# Patient Record
Sex: Female | Born: 1950
Health system: Southern US, Community
[De-identification: ages and names within clinical notes are randomized; demographics above are authoritative.]

## PROBLEM LIST (undated history)

## (undated) DIAGNOSIS — R0600 Dyspnea, unspecified: Secondary | ICD-10-CM

## (undated) DIAGNOSIS — T8859XA Other complications of anesthesia, initial encounter: Secondary | ICD-10-CM

## (undated) DIAGNOSIS — M1990 Unspecified osteoarthritis, unspecified site: Secondary | ICD-10-CM

## (undated) DIAGNOSIS — T4145XA Adverse effect of unspecified anesthetic, initial encounter: Secondary | ICD-10-CM

## (undated) DIAGNOSIS — I10 Essential (primary) hypertension: Secondary | ICD-10-CM

## (undated) DIAGNOSIS — K219 Gastro-esophageal reflux disease without esophagitis: Secondary | ICD-10-CM

## (undated) DIAGNOSIS — J45909 Unspecified asthma, uncomplicated: Secondary | ICD-10-CM

## (undated) DIAGNOSIS — M8580 Other specified disorders of bone density and structure, unspecified site: Secondary | ICD-10-CM

## (undated) DIAGNOSIS — D649 Anemia, unspecified: Secondary | ICD-10-CM

## (undated) DIAGNOSIS — E785 Hyperlipidemia, unspecified: Secondary | ICD-10-CM

## (undated) DIAGNOSIS — R51 Headache: Secondary | ICD-10-CM

## (undated) DIAGNOSIS — Z9889 Other specified postprocedural states: Secondary | ICD-10-CM

## (undated) DIAGNOSIS — R112 Nausea with vomiting, unspecified: Secondary | ICD-10-CM

## (undated) DIAGNOSIS — J449 Chronic obstructive pulmonary disease, unspecified: Secondary | ICD-10-CM

## (undated) HISTORY — DX: Essential (primary) hypertension: I10

## (undated) HISTORY — PX: ABDOMINAL HYSTERECTOMY: SHX81

## (undated) HISTORY — DX: Gastro-esophageal reflux disease without esophagitis: K21.9

## (undated) HISTORY — DX: Unspecified osteoarthritis, unspecified site: M19.90

## (undated) HISTORY — DX: Hyperlipidemia, unspecified: E78.5

## (undated) HISTORY — PX: OOPHORECTOMY: SHX86

## (undated) HISTORY — DX: Other specified disorders of bone density and structure, unspecified site: M85.80

## (undated) HISTORY — DX: Unspecified asthma, uncomplicated: J45.909

---

## 1988-11-24 HISTORY — PX: KNEE ARTHROSCOPY: SUR90

## 1990-11-24 HISTORY — PX: KNEE CARTILAGE SURGERY: SHX688

## 2000-09-21 ENCOUNTER — Encounter: Admission: RE | Admit: 2000-09-21 | Discharge: 2000-09-21 | Payer: Self-pay | Admitting: Internal Medicine

## 2005-03-24 LAB — HM COLONOSCOPY: HM Colonoscopy: NORMAL

## 2005-03-28 ENCOUNTER — Ambulatory Visit: Payer: Self-pay | Admitting: Gastroenterology

## 2006-06-15 ENCOUNTER — Ambulatory Visit: Payer: Self-pay | Admitting: Family Medicine

## 2006-06-29 ENCOUNTER — Ambulatory Visit: Payer: Self-pay | Admitting: Family Medicine

## 2006-08-27 ENCOUNTER — Ambulatory Visit: Payer: Self-pay | Admitting: Family Medicine

## 2006-08-31 ENCOUNTER — Ambulatory Visit: Payer: Self-pay | Admitting: Family Medicine

## 2006-10-08 ENCOUNTER — Ambulatory Visit: Payer: Self-pay | Admitting: Family Medicine

## 2006-11-05 ENCOUNTER — Ambulatory Visit: Payer: Self-pay | Admitting: Pain Medicine

## 2007-02-07 ENCOUNTER — Encounter: Payer: Self-pay | Admitting: Family Medicine

## 2007-02-07 DIAGNOSIS — G43109 Migraine with aura, not intractable, without status migrainosus: Secondary | ICD-10-CM | POA: Insufficient documentation

## 2007-02-07 DIAGNOSIS — M899 Disorder of bone, unspecified: Secondary | ICD-10-CM | POA: Insufficient documentation

## 2007-02-07 DIAGNOSIS — I10 Essential (primary) hypertension: Secondary | ICD-10-CM | POA: Insufficient documentation

## 2007-02-07 DIAGNOSIS — E785 Hyperlipidemia, unspecified: Secondary | ICD-10-CM | POA: Insufficient documentation

## 2007-02-07 DIAGNOSIS — M949 Disorder of cartilage, unspecified: Secondary | ICD-10-CM

## 2007-02-07 DIAGNOSIS — M25569 Pain in unspecified knee: Secondary | ICD-10-CM | POA: Insufficient documentation

## 2007-02-07 DIAGNOSIS — M1990 Unspecified osteoarthritis, unspecified site: Secondary | ICD-10-CM | POA: Insufficient documentation

## 2007-02-07 DIAGNOSIS — E782 Mixed hyperlipidemia: Secondary | ICD-10-CM | POA: Insufficient documentation

## 2007-02-07 DIAGNOSIS — K219 Gastro-esophageal reflux disease without esophagitis: Secondary | ICD-10-CM | POA: Insufficient documentation

## 2007-05-20 ENCOUNTER — Encounter: Payer: Self-pay | Admitting: Family Medicine

## 2007-06-01 HISTORY — PX: TOTAL KNEE ARTHROPLASTY: SHX125

## 2007-06-14 ENCOUNTER — Encounter: Payer: Self-pay | Admitting: Family Medicine

## 2007-07-12 ENCOUNTER — Encounter: Payer: Self-pay | Admitting: Family Medicine

## 2007-07-16 ENCOUNTER — Ambulatory Visit: Payer: Self-pay | Admitting: Family Medicine

## 2007-07-16 LAB — CONVERTED CEMR LAB
Cholesterol, target level: 200 mg/dL
HDL goal, serum: 40 mg/dL

## 2007-08-19 ENCOUNTER — Encounter: Payer: Self-pay | Admitting: Family Medicine

## 2007-09-07 ENCOUNTER — Ambulatory Visit: Payer: Self-pay | Admitting: Family Medicine

## 2007-09-09 LAB — CONVERTED CEMR LAB
AST: 20 units/L (ref 0–37)
Cholesterol: 153 mg/dL (ref 0–200)
HDL: 43.9 mg/dL (ref >39.0)
LDL Cholesterol: 73 mg/dL (ref 0–99)
Total CHOL/HDL Ratio: 3.5

## 2007-11-08 ENCOUNTER — Encounter: Payer: Self-pay | Admitting: Family Medicine

## 2007-11-30 ENCOUNTER — Telehealth: Payer: Self-pay | Admitting: Family Medicine

## 2008-01-10 ENCOUNTER — Encounter: Payer: Self-pay | Admitting: Family Medicine

## 2008-01-19 ENCOUNTER — Encounter (INDEPENDENT_AMBULATORY_CARE_PROVIDER_SITE_OTHER): Payer: Self-pay | Admitting: *Deleted

## 2008-03-09 ENCOUNTER — Ambulatory Visit: Payer: Self-pay | Admitting: Family Medicine

## 2008-03-17 LAB — CONVERTED CEMR LAB
Albumin: 3.6 g/dL (ref 3.5–5.2)
BUN: 6 mg/dL (ref 6–23)
Calcium: 8.5 mg/dL (ref 8.4–10.5)
Cholesterol: 188 mg/dL (ref 0–200)
GFR calc Af Amer: 95 mL/min
GFR calc non Af Amer: 78 mL/min
Glucose, Bld: 112 mg/dL — ABNORMAL HIGH (ref 70–99)
HDL: 38.9 mg/dL — ABNORMAL LOW (ref >39.0)
LDL Cholesterol: 121 mg/dL — ABNORMAL HIGH (ref 0–99)
Potassium: 4.2 meq/L (ref 3.5–5.1)
Total Protein: 6.9 g/dL (ref 6.0–8.3)
VLDL: 29 mg/dL (ref 0–40)

## 2008-03-21 ENCOUNTER — Encounter: Payer: Self-pay | Admitting: Family Medicine

## 2008-06-08 ENCOUNTER — Telehealth: Payer: Self-pay | Admitting: Family Medicine

## 2008-07-05 ENCOUNTER — Ambulatory Visit: Payer: Self-pay | Admitting: Family Medicine

## 2008-07-06 ENCOUNTER — Telehealth: Payer: Self-pay | Admitting: Family Medicine

## 2008-07-10 ENCOUNTER — Ambulatory Visit: Payer: Self-pay | Admitting: Family Medicine

## 2008-07-10 ENCOUNTER — Telehealth: Payer: Self-pay | Admitting: Family Medicine

## 2008-07-12 ENCOUNTER — Ambulatory Visit: Payer: Self-pay | Admitting: Family Medicine

## 2008-07-12 LAB — CONVERTED CEMR LAB
ALT: 24 units/L (ref 0–35)
Albumin: 2.7 g/dL — ABNORMAL LOW (ref 3.5–5.2)
BUN: 10 mg/dL (ref 6–23)
Basophils Relative: 0.4 % (ref 0.0–3.0)
Bilirubin, Direct: 0.1 mg/dL (ref 0.0–0.3)
CO2: 32 meq/L (ref 19–32)
Calcium: 8.3 mg/dL — ABNORMAL LOW (ref 8.4–10.5)
Creatinine, Ser: 1.1 mg/dL (ref 0.4–1.2)
Eosinophils Relative: 0.6 % (ref 0.0–5.0)
Epithelial cells, urine: 0 /lpf
Glucose, Bld: 138 mg/dL — ABNORMAL HIGH (ref 70–99)
HCT: 33.5 % — ABNORMAL LOW (ref 36.0–46.0)
Hemoglobin: 11.4 g/dL — ABNORMAL LOW (ref 12.0–15.0)
Lymphocytes Relative: 12.5 % (ref 12.0–46.0)
Monocytes Absolute: 0.2 10*3/uL (ref 0.1–1.0)
Monocytes Relative: 1.1 % — ABNORMAL LOW (ref 3.0–12.0)
Neutro Abs: 12.5 10*3/uL — ABNORMAL HIGH (ref 1.4–7.7)
Nitrite: NEGATIVE
RBC: 3.81 M/uL — ABNORMAL LOW (ref 3.87–5.11)
Sodium: 137 meq/L (ref 135–145)
Specific Gravity, Urine: <1.005
TSH: 1.45 microintl units/mL (ref 0.35–5.50)
Total Protein: 7.6 g/dL (ref 6.0–8.3)
Urine crystals, microscopic: 0 /hpf
WBC: 14.8 10*3/uL — ABNORMAL HIGH (ref 4.5–10.5)

## 2008-07-13 ENCOUNTER — Encounter: Payer: Self-pay | Admitting: Family Medicine

## 2008-07-17 ENCOUNTER — Telehealth: Payer: Self-pay | Admitting: Family Medicine

## 2008-07-26 ENCOUNTER — Telehealth: Payer: Self-pay | Admitting: Family Medicine

## 2008-08-02 ENCOUNTER — Ambulatory Visit: Payer: Self-pay | Admitting: Family Medicine

## 2008-08-02 DIAGNOSIS — E119 Type 2 diabetes mellitus without complications: Secondary | ICD-10-CM | POA: Insufficient documentation

## 2008-08-03 ENCOUNTER — Ambulatory Visit: Payer: Self-pay | Admitting: Family Medicine

## 2008-08-09 LAB — CONVERTED CEMR LAB
Basophils Absolute: 0.1 10*3/uL (ref 0.0–0.1)
Bilirubin, Direct: 0.1 mg/dL (ref 0.0–0.3)
Calcium: 8.4 mg/dL (ref 8.4–10.5)
Eosinophils Absolute: 0.3 10*3/uL (ref 0.0–0.7)
GFR calc Af Amer: 111 mL/min
Glucose, Bld: 118 mg/dL — ABNORMAL HIGH (ref 70–99)
HCT: 37.6 % (ref 36.0–46.0)
Hemoglobin: 12.6 g/dL (ref 12.0–15.0)
MCHC: 33.6 g/dL (ref 30.0–36.0)
MCV: 88.9 fL (ref 78.0–100.0)
Monocytes Absolute: 0.4 10*3/uL (ref 0.1–1.0)
Neutro Abs: 4.9 10*3/uL (ref 1.4–7.7)
RDW: 13.3 % (ref 11.5–14.6)
Sodium: 142 meq/L (ref 135–145)
Total Bilirubin: 0.7 mg/dL (ref 0.3–1.2)
Total Protein: 6.9 g/dL (ref 6.0–8.3)
Vitamin B-12: 161 pg/mL — ABNORMAL LOW (ref 211–911)

## 2008-08-11 ENCOUNTER — Ambulatory Visit: Payer: Self-pay | Admitting: Family Medicine

## 2008-08-29 ENCOUNTER — Telehealth: Payer: Self-pay | Admitting: Family Medicine

## 2008-09-13 ENCOUNTER — Telehealth: Payer: Self-pay | Admitting: Family Medicine

## 2008-10-04 ENCOUNTER — Encounter: Payer: Self-pay | Admitting: Family Medicine

## 2008-10-04 ENCOUNTER — Telehealth: Payer: Self-pay | Admitting: Family Medicine

## 2008-10-04 LAB — CONVERTED CEMR LAB: Glucose, Bld: 92 mg/dL

## 2008-10-16 ENCOUNTER — Encounter: Payer: Self-pay | Admitting: Family Medicine

## 2008-10-26 ENCOUNTER — Encounter: Payer: Self-pay | Admitting: Family Medicine

## 2008-11-03 ENCOUNTER — Ambulatory Visit: Payer: Self-pay | Admitting: Family Medicine

## 2008-11-08 LAB — CONVERTED CEMR LAB
ALT: 22 units/L (ref 0–35)
Bilirubin, Direct: 0.1 mg/dL (ref 0.0–0.3)
CO2: 29 meq/L (ref 19–32)
Calcium: 8.5 mg/dL (ref 8.4–10.5)
Cholesterol: 190 mg/dL (ref 0–200)
Folate: 9.9 ng/mL
GFR calc Af Amer: 111 mL/min
GFR calc non Af Amer: 91 mL/min
Hgb A1c MFr Bld: 6.5 % — ABNORMAL HIGH (ref 4.6–6.0)
LDL Cholesterol: 125 mg/dL — ABNORMAL HIGH (ref 0–99)
Microalb Creat Ratio: 5 mg/g (ref 0.0–30.0)
Microalb, Ur: 1.2 mg/dL (ref 0.0–1.9)
Sodium: 141 meq/L (ref 135–145)
Total Bilirubin: 0.7 mg/dL (ref 0.3–1.2)

## 2008-11-14 ENCOUNTER — Ambulatory Visit: Payer: Self-pay | Admitting: Family Medicine

## 2008-11-20 LAB — CONVERTED CEMR LAB
Alkaline Phosphatase: 107 units/L (ref 39–117)
Bilirubin, Direct: 0.1 mg/dL (ref 0.0–0.3)
GFR calc Af Amer: 111 mL/min
GFR calc non Af Amer: 91 mL/min
Potassium: 4 meq/L (ref 3.5–5.1)
Sodium: 141 meq/L (ref 135–145)

## 2009-01-11 ENCOUNTER — Encounter: Payer: Self-pay | Admitting: Family Medicine

## 2009-02-09 ENCOUNTER — Ambulatory Visit: Payer: Self-pay | Admitting: Family Medicine

## 2009-02-12 LAB — CONVERTED CEMR LAB
ALT: 18 units/L (ref 0–35)
LDL Cholesterol: 106 mg/dL — ABNORMAL HIGH (ref 0–99)
Total CHOL/HDL Ratio: 4
VLDL: 19.2 mg/dL (ref 0.0–40.0)

## 2009-02-26 ENCOUNTER — Ambulatory Visit: Payer: Self-pay | Admitting: Family Medicine

## 2009-03-30 ENCOUNTER — Telehealth: Payer: Self-pay | Admitting: Family Medicine

## 2009-05-08 ENCOUNTER — Ambulatory Visit: Payer: Self-pay | Admitting: Family Medicine

## 2009-05-08 DIAGNOSIS — M19049 Primary osteoarthritis, unspecified hand: Secondary | ICD-10-CM | POA: Insufficient documentation

## 2009-05-08 DIAGNOSIS — G5600 Carpal tunnel syndrome, unspecified upper limb: Secondary | ICD-10-CM | POA: Insufficient documentation

## 2009-05-10 ENCOUNTER — Encounter (INDEPENDENT_AMBULATORY_CARE_PROVIDER_SITE_OTHER): Payer: Self-pay | Admitting: *Deleted

## 2009-05-10 LAB — CONVERTED CEMR LAB
ALT: 30 U/L (ref 0–35)
AST: 27 U/L (ref 0–37)
Albumin: 3.6 g/dL (ref 3.5–5.2)
Alkaline Phosphatase: 104 U/L (ref 39–117)
BUN: 8 mg/dL (ref 6–23)
Bilirubin, Direct: 0 mg/dL (ref 0.0–0.3)
CO2: 29 meq/L (ref 19–32)
Calcium: 8.7 mg/dL (ref 8.4–10.5)
Chloride: 108 meq/L (ref 96–112)
Cholesterol: 155 mg/dL (ref 0–200)
Creatinine, Ser: 0.7 mg/dL (ref 0.4–1.2)
Creatinine,U: 52.2 mg/dL
GFR calc non Af Amer: 90.91 mL/min (ref >60)
Glucose, Bld: 129 mg/dL — ABNORMAL HIGH (ref 70–99)
HDL: 41.3 mg/dL (ref >39.00)
Hgb A1c MFr Bld: 7.5 % — ABNORMAL HIGH (ref 4.6–6.5)
LDL Cholesterol: 94 mg/dL (ref 0–99)
Microalb Creat Ratio: 5.7 mg/g (ref 0.0–30.0)
Microalb, Ur: 0.3 mg/dL (ref 0.0–1.9)
Potassium: 4.5 meq/L (ref 3.5–5.1)
Sodium: 142 meq/L (ref 135–145)
Total Bilirubin: 0.6 mg/dL (ref 0.3–1.2)
Total CHOL/HDL Ratio: 4
Total Protein: 6.7 g/dL (ref 6.0–8.3)
Triglycerides: 100 mg/dL (ref 0.0–149.0)
Uric Acid, Serum: 5.3 mg/dL (ref 2.4–7.0)
VLDL: 20 mg/dL (ref 0.0–40.0)

## 2009-05-31 ENCOUNTER — Encounter: Payer: Self-pay | Admitting: Family Medicine

## 2009-08-01 ENCOUNTER — Encounter (INDEPENDENT_AMBULATORY_CARE_PROVIDER_SITE_OTHER): Payer: Self-pay | Admitting: *Deleted

## 2009-08-08 ENCOUNTER — Ambulatory Visit: Payer: Self-pay | Admitting: Family Medicine

## 2009-08-09 LAB — CONVERTED CEMR LAB: Hgb A1c MFr Bld: 7.3 % — ABNORMAL HIGH (ref 4.6–6.5)

## 2009-08-23 ENCOUNTER — Ambulatory Visit: Payer: Self-pay | Admitting: Family Medicine

## 2009-08-23 DIAGNOSIS — J4489 Other specified chronic obstructive pulmonary disease: Secondary | ICD-10-CM | POA: Insufficient documentation

## 2009-08-23 DIAGNOSIS — J449 Chronic obstructive pulmonary disease, unspecified: Secondary | ICD-10-CM | POA: Insufficient documentation

## 2009-08-28 ENCOUNTER — Ambulatory Visit: Payer: Self-pay | Admitting: Family Medicine

## 2009-08-28 ENCOUNTER — Encounter: Payer: Self-pay | Admitting: Family Medicine

## 2009-09-17 ENCOUNTER — Encounter: Payer: Self-pay | Admitting: Family Medicine

## 2009-11-06 ENCOUNTER — Ambulatory Visit: Payer: Self-pay | Admitting: Family Medicine

## 2009-11-06 ENCOUNTER — Telehealth: Payer: Self-pay | Admitting: Family Medicine

## 2009-11-09 LAB — CONVERTED CEMR LAB
ALT: 35 units/L (ref 0–35)
Albumin: 3.9 g/dL (ref 3.5–5.2)
BUN: 8 mg/dL (ref 6–23)
CO2: 30 meq/L (ref 19–32)
Calcium: 8.6 mg/dL (ref 8.4–10.5)
Chloride: 103 meq/L (ref 96–112)
Cholesterol: 137 mg/dL (ref 0–200)
Creatinine, Ser: 0.6 mg/dL (ref 0.4–1.2)
Hgb A1c MFr Bld: 6.9 % — ABNORMAL HIGH (ref 4.6–6.5)
Total Protein: 7.4 g/dL (ref 6.0–8.3)
Triglycerides: 124 mg/dL (ref 0.0–149.0)

## 2009-12-03 ENCOUNTER — Telehealth: Payer: Self-pay | Admitting: Family Medicine

## 2009-12-11 ENCOUNTER — Encounter: Payer: Self-pay | Admitting: Family Medicine

## 2009-12-25 ENCOUNTER — Telehealth (INDEPENDENT_AMBULATORY_CARE_PROVIDER_SITE_OTHER): Payer: Self-pay | Admitting: *Deleted

## 2010-01-14 ENCOUNTER — Encounter: Payer: Self-pay | Admitting: Family Medicine

## 2010-01-17 ENCOUNTER — Telehealth: Payer: Self-pay | Admitting: Family Medicine

## 2010-01-22 ENCOUNTER — Encounter: Payer: Self-pay | Admitting: Family Medicine

## 2010-02-20 ENCOUNTER — Telehealth: Payer: Self-pay | Admitting: Family Medicine

## 2010-03-07 ENCOUNTER — Ambulatory Visit: Payer: Self-pay | Admitting: Family Medicine

## 2010-03-12 ENCOUNTER — Ambulatory Visit: Payer: Self-pay | Admitting: Family Medicine

## 2010-03-12 DIAGNOSIS — G609 Hereditary and idiopathic neuropathy, unspecified: Secondary | ICD-10-CM | POA: Insufficient documentation

## 2010-03-13 DIAGNOSIS — E538 Deficiency of other specified B group vitamins: Secondary | ICD-10-CM | POA: Insufficient documentation

## 2010-03-13 LAB — CONVERTED CEMR LAB
Alkaline Phosphatase: 90 units/L (ref 39–117)
Basophils Relative: 0.5 % (ref 0.0–3.0)
Bilirubin, Direct: 0 mg/dL (ref 0.0–0.3)
CO2: 28 meq/L (ref 19–32)
Calcium: 8.6 mg/dL (ref 8.4–10.5)
Creatinine, Ser: 0.7 mg/dL (ref 0.4–1.2)
Eosinophils Relative: 2.1 % (ref 0.0–5.0)
Folate: 8.9 ng/mL
GFR calc non Af Amer: 90.66 mL/min (ref >60)
HDL: 45.4 mg/dL (ref >39.00)
Hemoglobin: 13.8 g/dL (ref 12.0–15.0)
LDL Cholesterol: 70 mg/dL (ref 0–99)
MCV: 88.1 fL (ref 78.0–100.0)
Monocytes Absolute: 0.5 10*3/uL (ref 0.1–1.0)
Neutro Abs: 6 10*3/uL (ref 1.4–7.7)
Neutrophils Relative %: 65 % (ref 43.0–77.0)
RBC: 4.57 M/uL (ref 3.87–5.11)
Sodium: 143 meq/L (ref 135–145)
Total Bilirubin: 0.2 mg/dL — ABNORMAL LOW (ref 0.3–1.2)
Total CHOL/HDL Ratio: 3
Total Protein: 6.7 g/dL (ref 6.0–8.3)
Triglycerides: 184 mg/dL — ABNORMAL HIGH (ref 0.0–149.0)
VLDL: 36.8 mg/dL (ref 0.0–40.0)
Vitamin B-12: 242 pg/mL (ref 211–911)
WBC: 9.3 10*3/uL (ref 4.5–10.5)

## 2010-03-14 ENCOUNTER — Ambulatory Visit: Payer: Self-pay | Admitting: Family Medicine

## 2010-04-16 ENCOUNTER — Telehealth: Payer: Self-pay | Admitting: Family Medicine

## 2010-06-19 ENCOUNTER — Ambulatory Visit: Payer: Self-pay | Admitting: Family Medicine

## 2010-06-21 LAB — CONVERTED CEMR LAB
ALT: 28 units/L (ref 0–35)
AST: 22 units/L (ref 0–37)
Bilirubin, Direct: 0.1 mg/dL (ref 0.0–0.3)
CO2: 28 meq/L (ref 19–32)
Chloride: 103 meq/L (ref 96–112)
Hgb A1c MFr Bld: 6.5 % (ref 4.6–6.5)
LDL Cholesterol: 62 mg/dL (ref 0–99)
Potassium: 4.6 meq/L (ref 3.5–5.1)
Total Bilirubin: 0.6 mg/dL (ref 0.3–1.2)
Total CHOL/HDL Ratio: 3

## 2010-06-26 ENCOUNTER — Ambulatory Visit: Payer: Self-pay | Admitting: Family Medicine

## 2010-06-27 LAB — CONVERTED CEMR LAB
Eosinophils Relative: 2.5 % (ref 0.0–5.0)
Lymphocytes Relative: 25.5 % (ref 12.0–46.0)
Monocytes Absolute: 0.4 10*3/uL (ref 0.1–1.0)
Monocytes Relative: 5 % (ref 3.0–12.0)
Neutrophils Relative %: 66.5 % (ref 43.0–77.0)
Platelets: 214 10*3/uL (ref 150.0–400.0)
TSH: 1.68 microintl units/mL (ref 0.35–5.50)
WBC: 8.8 10*3/uL (ref 4.5–10.5)

## 2010-07-15 ENCOUNTER — Telehealth: Payer: Self-pay | Admitting: Family Medicine

## 2010-08-13 ENCOUNTER — Telehealth: Payer: Self-pay | Admitting: Family Medicine

## 2010-08-19 ENCOUNTER — Telehealth: Payer: Self-pay | Admitting: Family Medicine

## 2010-09-16 ENCOUNTER — Telehealth: Payer: Self-pay | Admitting: Family Medicine

## 2010-12-22 LAB — CONVERTED CEMR LAB
Glucose, Urine, Semiquant: >=1000
Specific Gravity, Urine: 1.015
pH: 6

## 2010-12-24 NOTE — Progress Notes (Signed)
Summary: regarding drug interaction  Phone Note From Pharmacy   Caller: [facility] Summary of Call: Note regarding drug interaction between zocor and amlodipine is on your desk.   Initial call taken by: Lowella Petties CMA,  July 15, 2010 4:08 PM  Follow-up for Phone Call        noted, if no SE, not clinically significant. Follow-up by: Hannah Beat MD,  July 15, 2010 4:58 PM

## 2010-12-24 NOTE — Progress Notes (Signed)
Summary: regarding tramadol  Phone Note Refill Request Message from:  Patient  Refills Requested: Medication #1:  TRAMADOL HCL 50 MG TABS 1 tab by mouth daily as needed as needed knee pain Pt called, is very upset that her tramadol was denied to [facility] last month.  I told her [facility], she says she doesnt use [facility], [facility]ills should have gone to [facility].  Now she is out of medicine.  I apologized to her, told her we would get approval from you to re-send this to [facility].  I called [facility] to cancel script but they said it had never been called in.  Initial call taken by: Lowella Petties CMA, AAMA,  September 16, 2010 2:49 PM  Follow-up for Phone Call        yes..resend to [facility]. Follow-up by: Kerby Nora MD,  September 17, 2010 8:22 AM  Additional Follow-up for Phone Call Additional follow up Details #1::        medication sent to [facility] and patient advised.Consuello Masse CMA   Additional Follow-up by: Benny Lennert CMA Duncan Dull),  September 17, 2010 8:39 AM    Prescriptions: TRAMADOL HCL 50 MG TABS (TRAMADOL HCL) 1 tab by mouth daily as needed as needed knee pain  #90 x 0   Entered by:   Benny Lennert CMA (AAMA)   Authorized by:   Kerby Nora MD   Signed by:   Benny Lennert CMA (AAMA) on 09/17/2010   Method used:   Faxed to ...       [facility] (mail-[facility])             , [place]         Ph: [phone]       Fax: [phone]   RxID:   [id number]

## 2010-12-24 NOTE — Assessment & Plan Note (Signed)
Summary: 3 month follow up/rbh   Vital Signs:  Patient profile:   60 year old female Height:      65.5 inches Weight:      169.8 pounds BMI:     27.93 Temp:     97.8 degrees F oral Pulse rate:   76 / minute Pulse rhythm:   regular BP sitting:   100 / 64  (left arm) Cuff size:   regular  Vitals Entered By: Benny Lennert CMA Duncan Dull) (June 26, 2010 8:38 AM)  History of Present Illness: Chief complaint 3 month follow up   DM, well controlled on metformin   In last several months...diarrhea, fatigue. One episode of vomiting in last week...intermittant nausea. Decreased appetite. Describes stools as liquid....2-3 per day...occurs after eating. No change in diet. No gas expelled. No abdominal cramping.  No blood in stool.  No recent antibiotics.  Colonoscopy in 2006.  Has lost 7 lbs in last 3 months.  Chronic left knee pain...s/p replacement. Now using tramadol daily.  Hypertension History:      Well controlled at home.        Positive major cardiovascular risk factors include female age 31 years old or older, diabetes, hyperlipidemia, and hypertension.  Negative major cardiovascular risk factors include non-tobacco-user status.        Further assessment for target organ damage reveals no history of ASHD, stroke/TIA, or peripheral vascular disease.    Lipid Management History:      Positive NCEP/ATP III risk factors include female age 80 years old or older, diabetes, and hypertension.  Negative NCEP/ATP III risk factors include non-tobacco-user status, no ASHD (atherosclerotic heart disease), no prior stroke/TIA, no peripheral vascular disease, and no history of aortic aneurysm.        The patient states that she knows about the "Therapeutic Lifestyle Change" diet.  Her compliance with the TLC diet is fair.      Problems Prior to Update: 1)  Finger Pain  (ICD-729.5) 2)  Vitamin B12 Deficiency  (ICD-266.2) 3)  Peripheral Neuropathy  (ICD-356.9) 4)  Preventive Health  Care  (ICD-V70.0) 5)  Copd, Mild  (ICD-496) 6)  Arthritis, Carpometacarpal Joint  (ICD-716.94) 7)  Carpal Tunnel Syndrome, Left  (ICD-354.0) 8)  Diabetes Mellitus, Type II  (ICD-250.00) 9)  Other Screening Mammogram  (ICD-V76.12) 10)  S/P Anes Opn/surg Arthrs Kne Jt Tot Kne Arthrp  (CPT-01402) 11)  Migraine, Classical w/o Intractable Migraine  (ICD-346.00) 12)  Knee Pain, Left, Chronic  (ICD-719.46) 13)  Osteopenia  (ICD-733.90) 14)  Osteoarthritis  (ICD-715.90) 15)  Hypertension  (ICD-401.9) 16)  Hyperlipidemia  (ICD-272.4) 17)  Gerd  (ICD-530.81)  Current Medications (verified): 1)  Tramadol Hcl 50 Mg Tabs (Tramadol Hcl) .Marland Kitchen.. 1 Tab By Mouth Daily As Needed As Needed Knee Pain 2)  Lyrica 100 Mg  Caps (Pregabalin) .Marland Kitchen.. 1 By Mouth Three Times A Day 3)  Multivitamins   Tabs (Multiple Vitamin) .... Once Daily 4)  Imitrex 50 Mg  Tabs (Sumatriptan Succinate) .... As Needed Migraines 5)  Amlodipine Besylate 5 Mg Tabs (Amlodipine Besylate) .Marland Kitchen.. 1 Tab By Mouth Daily 6)  Simvastatin 40 Mg Tabs (Simvastatin) .... Take 1 Tablet By Mouth Once A Day 7)  Metformin Hcl 500 Mg Tabs (Metformin Hcl) .... Take 2 Tablet By Mouth Two Times A Day 8)  Spiriva Handihaler 18 Mcg Caps (Tiotropium Bromide Monohydrate) .Marland Kitchen.. 1 Inh Daily 9)  Diclofenac Sodium 75 Mg Tbec (Diclofenac Sodium) .Marland Kitchen.. 1 Tab By Mouth Two Times A Day X  As Needed Arthritis Pain  Allergies: 1)  ! Cortisone Acetate (Cortisone Acetate) 2)  * Penicillins Group 3)  * Tetracycline Analogues Group 4)  * Acei  Past History:  Past medical, surgical, family and social histories (including risk factors) reviewed, and no changes noted (except as noted below).  Past Medical History: Reviewed history from 02/07/2007 and no changes required. GERD Hyperlipidemia Hypertension Osteoarthritis Osteopenia  Past Surgical History: Reviewed history from 07/16/2007 and no changes required. 1990 L arthroscopic knee surgery 1992 L knee cartilage  repair 7/ 2008 L knee replacement  Family History: Reviewed history from 02/07/2007 and no changes required. father: deceased age 64 MI, CAD mother: age 70 RA, osteoarthritis sister: DM grandparents: DM MGM: breast CA  Social History: Reviewed history from 02/07/2007 and no changes required. Patient is a former smoker.  Divorced, former smoker 25 pack year history,  occupation accounts payable, finance  Review of Systems General:  Complains of fatigue; denies fever. CV:  Denies chest pain or discomfort. Resp:  Denies shortness of breath. GI:  Denies abdominal pain and bloody stools. GU:  Denies abnormal vaginal bleeding and dysuria.  Physical Exam  General:  overweight appearing female in NAD Mouth:  MMM Neck:  no carotid bruit or thyromegaly no cervical or supraclavicular lymphadenopathy  Lungs:  Normal respiratory effort, chest expands symmetrically. Lungs are clear to auscultation, no crackles or wheezes. Heart:  Normal rate and regular rhythm. S1 and S2 normal without gallop, murmur, click, rub or other extra sounds. Abdomen:  Bowel sounds positive,abdomen soft and non-tender without masses, organomegaly or hernias noted. Msk:  B knees full ROm.Marland Kitchenleft knee s/s replacemetn with resulting scar and deformity. No current swelling.    Impression & Recommendations:  Problem # 1:  DIARRHEA (ICD-787.91) Will eval with labs and Cdifficile. Recent labs nml...LFTs and electrolytes.  Keep food diary.  Start probiotic...as this may bee IBS like symptoms.  ? due to increase in metformin over 1 year ago. Follow up if not improving.  2006 colonoscopy nml.  Orders: TLB-TSH (Thyroid Stimulating Hormone) (84443-TSH) TLB-CBC Platelet - w/Differential (85025-CBCD) T- * Misc. Laboratory test [phone])  Problem # 2:  DIABETES MELLITUS, TYPE II (ICD-250.00)  Doing well. Continue current meds.  Her updated medication list for this problem includes:    Metformin Hcl 500 Mg Tabs  (Metformin hcl) .Marland Kitchen... Take 2 tablet by mouth two times a day  Labs Reviewed: Creat: 0.6 (06/19/2010)    Reviewed HgBA1c results: 6.5 (06/19/2010)  6.5 (03/07/2010)  Problem # 3:  HYPERTENSION (ICD-401.9) Well controlled. Continue current medication.  Her updated medication list for this problem includes:    Amlodipine Besylate 5 Mg Tabs (Amlodipine besylate) .Marland Kitchen... 1 tab by mouth daily  Problem # 4:  HYPERLIPIDEMIA (ICD-272.4) Well controlled. Continue current medication.  Her updated medication list for this problem includes:    Simvastatin 40 Mg Tabs (Simvastatin) .Marland Kitchen... Take 1 tablet by mouth once a day  Problem # 5:  KNEE PAIN, LEFT, CHRONIC (ICD-719.46) offered referral back to [facility] or for second opinion on her concern of why let knee swells ever since knee replacement.  Try diclofenac fofr likely arthritis in right knee. Tramadol for break thru pain.   Amke appt to eval knees in mre detail if pain poorly controlled.  Her updated medication list for this problem includes:    Tramadol Hcl 50 Mg Tabs (Tramadol hcl) .Marland Kitchen... 1 tab by mouth daily as needed as needed knee pain    Diclofenac Sodium 75 Mg Tbec (  Diclofenac sodium) .Marland Kitchen... 1 tab by mouth two times a day x as needed arthritis pain  Complete Medication List: 1)  Tramadol Hcl 50 Mg Tabs (Tramadol hcl) .Marland Kitchen.. 1 tab by mouth daily as needed as needed knee pain 2)  Lyrica 100 Mg Caps (Pregabalin) .Marland Kitchen.. 1 by mouth three times a day 3)  Multivitamins Tabs (Multiple vitamin) .... Once daily 4)  Imitrex 50 Mg Tabs (Sumatriptan succinate) .... As needed migraines 5)  Amlodipine Besylate 5 Mg Tabs (Amlodipine besylate) .Marland Kitchen.. 1 tab by mouth daily 6)  Simvastatin 40 Mg Tabs (Simvastatin) .... Take 1 tablet by mouth once a day 7)  Metformin Hcl 500 Mg Tabs (Metformin hcl) .... Take 2 tablet by mouth two times a day 8)  Spiriva Handihaler 18 Mcg Caps (Tiotropium bromide monohydrate) .Marland Kitchen.. 1 inh daily 9)  Diclofenac Sodium 75 Mg Tbec (Diclofenac  sodium) .Marland Kitchen.. 1 tab by mouth two times a day x as needed arthritis pain  Hypertension Assessment/Plan:      The patient's hypertensive risk group is category C: Target organ damage and/or diabetes.  Her calculated 10 year risk of coronary heart disease is 11 %.  Today's blood pressure is 100/64.  Her blood pressure goal is < 130/80.  Lipid Assessment/Plan:      Based on NCEP/ATP III, the patient's risk factor category is "history of diabetes".  The patient's lipid goals are as follows: Total cholesterol goal is 200; LDL cholesterol goal is 100; HDL cholesterol goal is 40; Triglyceride goal is 150.  Her LDL cholesterol goal has been met.    Patient Instructions: 1)  Please schedule a follow-up appointment in 6 months DM 2)  BMP prior to visit, ICD-9: 250.00 3)  Hepatic Panel prior to visit ICD-9:  4)  Lipid panel prior to visit ICD-9 :  5)  HgBA1c prior to visit  ICD-9:  6)  Urine Microalbumin prior to visit ICD-9 :  7)  Diclofenac for knee pain.two times a day. 8)   Use tramadol for breakthru pain. 9)  Call to make earlier appt if knee pain not improving and needs further eval.  10)  Start align, keep food diary, push fluids. 11)   Return to normal healthy diet.  Prescriptions: TRAMADOL HCL 50 MG TABS (TRAMADOL HCL) 1 tab by mouth daily as needed as needed knee pain  #30 x 0   Entered and Authorized by:   Kerby Nora MD   Signed by:   Kerby Nora MD on 06/26/2010   Method used:   Print then Give to Patient   RxID:   [id number] AMLODIPINE BESYLATE 5 MG TABS (AMLODIPINE BESYLATE) 1 tab by mouth daily  #90 x 3   Entered and Authorized by:   Kerby Nora MD   Signed by:   Kerby Nora MD on 06/26/2010   Method used:   Electronically to        [facility] (retail)       [address]       [place], [place]  [address]       Ph: [phone]       Fax: [phone]   RxID:   [id number]   Current Allergies (reviewed today): !  CORTISONE ACETATE (CORTISONE ACETATE) * PENICILLINS GROUP * TETRACYCLINE ANALOGUES GROUP * ACEI

## 2010-12-24 NOTE — Progress Notes (Signed)
  Phone Note Other Incoming   Summary of Call: FYI, I canceled the stool test ordered on 8.3.11, for cdiff. Patient never returned a stool sample. Initial call taken by: Mills Koller,  August 13, 2010 10:50 AM

## 2010-12-24 NOTE — Assessment & Plan Note (Signed)
Summary: b-12 injection/copland/hmw  Nurse Visit   Allergies: 1)  ! Cortisone Acetate (Cortisone Acetate) 2)  * Penicillins Group 3)  * Tetracycline Analogues Group 4)  * Acei  Medication Administration  Injection # 1:    Medication: Vit B12 1000 mcg    Diagnosis: VITAMIN B12 DEFICIENCY (ICD-266.2)    Route: IM    Site: L deltoid    Exp Date: 10/24/2011    Lot #: [id number]    Mfr: [employer] Regent    Patient tolerated injection without complications    Given by: Delilah Shan CMA Duncan Dull) (March 14, 2010 2:34 PM)  Orders Added: 1)  Admin of Therapeutic Inj  intramuscular or subcutaneous [96372] 2)  Vit B12 1000 mcg [J3420]   Medication Administration  Injection # 1:    Medication: Vit B12 1000 mcg    Diagnosis: VITAMIN B12 DEFICIENCY (ICD-266.2)    Route: IM    Site: L deltoid    Exp Date: 10/24/2011    Lot #: [id number]    Mfr: American Regent    Patient tolerated injection without complications    Given by: Delilah Shan CMA Duncan Dull) (March 14, 2010 2:34 PM)  Orders Added: 1)  Admin of Therapeutic Inj  intramuscular or subcutaneous [96372] 2)  Vit B12 1000 mcg [J3420]

## 2010-12-24 NOTE — Assessment & Plan Note (Signed)
Summary: ROA   Vital Signs:  Patient profile:   60 year old female Height:      65.5 inches Weight:      178.8 pounds BMI:     29.41 Temp:     98.1 degrees F oral Pulse rate:   76 / minute Pulse rhythm:   regular BP sitting:   110 / 80  (left arm) Cuff size:   regular  Vitals Entered By: Benny Lennert CMA Duncan Dull) (December 11, 2009 8:58 AM)  History of Present Illness: Chief complaint return office visit  pt scheduled inappropriately...no charge.   Allergies: 1)  ! Cortisone Acetate (Cortisone Acetate) 2)  * Penicillins Group 3)  * Tetracycline Analogues Group 4)  * Acei   Complete Medication List: 1)  Tramadol Hcl 50 Mg Tabs (Tramadol hcl) .Marland Kitchen.. 1 tab by mouth daily as needed as needed knee pain 2)  Lyrica 100 Mg Caps (Pregabalin) .Marland Kitchen.. 1 by mouth three times a day 3)  Multivitamins Tabs (Multiple vitamin) .... Once daily 4)  Imitrex 50 Mg Tabs (Sumatriptan succinate) .... As needed migraines 5)  Amlodipine Besylate 5 Mg Tabs (Amlodipine besylate) .Marland Kitchen.. 1 tab by mouth daily 6)  Simvastatin 40 Mg Tabs (Simvastatin) .... Take 1 tablet by mouth once a day 7)  Metformin Hcl 500 Mg Tabs (Metformin hcl) .... Take 2 tablet by mouth two times a day 8)  Spiriva Handihaler 18 Mcg Caps (Tiotropium bromide monohydrate) .Marland Kitchen.. 1 inh daily  Other Orders: No Charge Patient Arrived (NCPA0) (NCPA0)  Patient Instructions: 1)   Please schedule a follow-up appointment in 3 months  DM 2)    Fasting lipids, CMET, A1C Dx 250.000 prior.   Current Allergies (reviewed today): ! CORTISONE ACETATE (CORTISONE ACETATE) * PENICILLINS GROUP * TETRACYCLINE ANALOGUES GROUP * ACEI

## 2010-12-24 NOTE — Progress Notes (Signed)
Summary: refill request for tramadol  Phone Note Refill Request Message from:  Patient  Refills Requested: Medication #1:  TRAMADOL HCL 50 MG TABS 1 tab by mouth daily as needed as needed knee pain Phoned request from pt, please send to [facility].  Initial call taken by: Lowella Petties CMA,  February 20, 2010 8:09 AM    Prescriptions: TRAMADOL HCL 50 MG TABS (TRAMADOL HCL) 1 tab by mouth daily as needed as needed knee pain  #30 x 0   Entered by:   Benny Lennert CMA (AAMA)   Authorized by:   Kerby Nora MD   Signed by:   Benny Lennert CMA (AAMA) on 02/20/2010   Method used:   Faxed to ...       [facility] (mail-order)             , [place]         Ph:        Fax: [phone]   RxID:   [id number] TRAMADOL HCL 50 MG TABS (TRAMADOL HCL) 1 tab by mouth daily as needed as needed knee pain  #30 x 0   Entered and Authorized by:   Kerby Nora MD   Signed by:   Kerby Nora MD on 02/20/2010   Method used:   Telephoned to ...       [facility] (retail)       [address]       [place], [place]  [address]       Ph: [phone]       Fax: [phone]   RxID:   [id number]  patient wanted this faxed to pharmacy

## 2010-12-24 NOTE — Progress Notes (Signed)
Summary: copy of med list faxed  Phone Note Call from Patient Call back at Work Phone [phone]   Caller: Patient Action Taken: Patient advised to go to ER Summary of Call: Pt requests that a copy of her medicine list be faxed to her.  Copy faxed to [phone], pt's personal fax number. Initial call taken by: Lowella Petties CMA,  January 17, 2010 3:25 PM

## 2010-12-24 NOTE — Progress Notes (Signed)
Summary: simvastatin  Phone Note Refill Request Call back at Work Phone [phone] Message from:  Patient on December 25, 2009 2:58 PM  Refills Requested: Medication #1:  SIMVASTATIN 40 MG TABS Take 1 tablet by mouth once a day Patient would like it sent to [facility].   Initial call taken by: Melody Comas,  December 25, 2009 2:58 PM Caller: Patient Call For: Patricia Nora MD    Prescriptions: SIMVASTATIN 40 MG TABS (SIMVASTATIN) Take 1 tablet by mouth once a day  #90 x 3   Entered by:   Delilah Shan CMA (AAMA)   Authorized by:   Patricia Nora MD   Signed by:   Delilah Shan CMA (AAMA) on 12/26/2009   Method used:   Electronically to        [facility] ([facility])             ,          Ph: [phone]       Fax: [phone]   RxID:   [id number]

## 2010-12-24 NOTE — Progress Notes (Signed)
Summary: Tramadol  Phone Note Refill Request Message from:  Fax from Pharmacy on Apr 16, 2010 2:54 PM  Refills Requested: Medication #1:  TRAMADOL HCL 50 MG TABS 1 tab by mouth daily as needed as needed knee pain [facility]   (Form in your in box).   Method Requested: Fax to Mail Away Pharmacy Initial call taken by: Delilah Shan CMA Duncan Dull),  Apr 16, 2010 2:55 PM  Follow-up for Phone Call        Compoleted form for refill..not called in.  Follow-up by: Kerby Nora MD,  Apr 16, 2010 4:55 PM    Prescriptions: TRAMADOL HCL 50 MG TABS (TRAMADOL HCL) 1 tab by mouth daily as needed as needed knee pain  #30 x 0   Entered and Authorized by:   Kerby Nora MD   Signed by:   Kerby Nora MD on 04/16/2010   Method used:   Telephoned to ...       [facility] (retail)       [address]       [place], [place]  [address]       Ph: [phone]       Fax: [phone]   RxID:   [id number]

## 2010-12-24 NOTE — Progress Notes (Signed)
Summary: Rx Tramadol  Phone Note Refill Request Call back at [phone] Message from:  [facility] on December 03, 2009 2:21 PM  Refills Requested: Medication #1:  TRAMADOL HCL 50 MG TABS 1 tab by mouth daily as needed as needed knee pain Received faxed refill request, please advise. Form in your IN box   Method Requested: Fax to Mail Away Pharmacy Initial call taken by: Linde Gillis CMA Duncan Dull),  December 03, 2009 2:22 PM    Prescriptions: TRAMADOL HCL 50 MG TABS (TRAMADOL HCL) 1 tab by mouth daily as needed as needed knee pain  #30 x 0   Entered by:   Benny Lennert CMA (AAMA)   Authorized by:   Kerby Nora MD   Signed by:   Benny Lennert CMA (AAMA) on 12/03/2009   Method used:   Faxed to ...       [facility] (retail)       [address]       [place], [place]  [address]       Ph: [phone]       Fax: [phone]   RxID:   [id number] TRAMADOL HCL 50 MG TABS (TRAMADOL HCL) 1 tab by mouth daily as needed as needed knee pain  #30 x 0   Entered by:   Hannah Beat MD   Authorized by:   Kerby Nora MD   Signed by:   Hannah Beat MD on 12/03/2009   Method used:   Telephoned to ...       [facility] (retail)       [address]       [place], [place]  [address]       Ph: [phone]       Fax: [phone]   RxID:   [id number]  Faxed to pharmacy.Consuello Masse CMA

## 2010-12-24 NOTE — Progress Notes (Signed)
Summary: TRAMADOL  Phone Note Refill Request Message from:  [employer] on August 19, 2010 2:22 PM  Refills Requested: Medication #1:  TRAMADOL HCL 50 MG TABS 1 tab by mouth daily as needed as needed knee pain   Supply Requested: 1 month [facility] [phone]   Method Requested: Electronic Initial call taken by: Benny Lennert CMA Duncan Dull),  August 19, 2010 2:22 PM  Follow-up for Phone Call        Rx faxed to pharmacy Follow-up by: Benny Lennert CMA Duncan Dull),  August 20, 2010 9:02 AM    Prescriptions: TRAMADOL HCL 50 MG TABS (TRAMADOL HCL) 1 tab by mouth daily as needed as needed knee pain  #30 x 0   Entered by:   Benny Lennert CMA (AAMA)   Authorized by:   Kerby Nora MD   Signed by:   Benny Lennert CMA (AAMA) on 08/20/2010   Method used:   Printed then faxed to ...       [facility] (retail)       [address]       [place], [place]  [address]       Ph: [phone]       Fax: [phone]   RxID:   [id number] TRAMADOL HCL 50 MG TABS (TRAMADOL HCL) 1 tab by mouth daily as needed as needed knee pain  #30 x 0   Entered and Authorized by:   Kerby Nora MD   Signed by:   Kerby Nora MD on 08/20/2010   Method used:   Telephoned to ...       [facility] (retail)       [address]       [place], [place]  [address]       Ph: [phone]       Fax: [phone]   RxID:   [id number]

## 2010-12-24 NOTE — Assessment & Plan Note (Signed)
Summary: ROA   Vital Signs:  Patient profile:   60 year old female Height:      65.5 inches Weight:      176.6 pounds BMI:     29.05 Temp:     97.9 degrees F oral Pulse rate:   76 / minute Pulse rhythm:   regular BP sitting:   140 / 82  (left arm) Cuff size:   regular  Vitals Entered By: Benny Lennert CMA Duncan Dull) (March 12, 2010 9:19 AM)  History of Present Illness: Chief complaint follow up  DM, well controlled on current lifestyle and meds.   Cholesterol well controlled except triglycerides remain above 150.   Swelling, proximal in left 2nd digit x 2 weeks. Shooting pains. Hears clicking sound when moves it. No redness. No hx of gout.  B knee pain..has hx of chronic knee pain. Left leg intermittant burning and tingling, newer, constant x3-6 months. Feet burn when wears boots.  No low back pain.  No pain radiating from.   HX of CMC joint arthritis.Marland Kitchenno relief with meloxicam., uric acid nml at 5.3.   Hypertension History:      Positive major cardiovascular risk factors include female age 56 years old or older, diabetes, hyperlipidemia, and hypertension.  Negative major cardiovascular risk factors include non-tobacco-user status.        Further assessment for target organ damage reveals no history of ASHD, stroke/TIA, or peripheral vascular disease.     Problems Prior to Update: 1)  Vitamin B12 Deficiency  (ICD-266.2) 2)  Peripheral Neuropathy  (ICD-356.9) 3)  Preventive Health Care  (ICD-V70.0) 4)  Ganglion Cyst  (ICD-727.43) 5)  Copd, Mild  (ICD-496) 6)  Arthritis, Carpometacarpal Joint  (ICD-716.94) 7)  Carpal Tunnel Syndrome, Left  (ICD-354.0) 8)  Shingles  (ICD-053.9) 9)  Diabetes Mellitus, Type II  (ICD-250.00) 10)  Other Screening Mammogram  (ICD-V76.12) 11)  S/P Anes Opn/surg Arthrs Kne Jt Tot Kne Arthrp  (CPT-01402) 12)  Migraine, Classical w/o Intractable Migraine  (ICD-346.00) 13)  Knee Pain, Left, Chronic  (ICD-719.46) 14)  Osteopenia   (ICD-733.90) 15)  Osteoarthritis  (ICD-715.90) 16)  Hypertension  (ICD-401.9) 17)  Hyperlipidemia  (ICD-272.4) 18)  Gerd  (ICD-530.81)  Current Medications (verified): 1)  Tramadol Hcl 50 Mg Tabs (Tramadol Hcl) .Marland Kitchen.. 1 Tab By Mouth Daily As Needed As Needed Knee Pain 2)  Lyrica 100 Mg  Caps (Pregabalin) .Marland Kitchen.. 1 By Mouth Three Times A Day 3)  Multivitamins   Tabs (Multiple Vitamin) .... Once Daily 4)  Imitrex 50 Mg  Tabs (Sumatriptan Succinate) .... As Needed Migraines 5)  Amlodipine Besylate 5 Mg Tabs (Amlodipine Besylate) .Marland Kitchen.. 1 Tab By Mouth Daily 6)  Simvastatin 40 Mg Tabs (Simvastatin) .... Take 1 Tablet By Mouth Once A Day 7)  Metformin Hcl 500 Mg Tabs (Metformin Hcl) .... Take 2 Tablet By Mouth Two Times A Day 8)  Spiriva Handihaler 18 Mcg Caps (Tiotropium Bromide Monohydrate) .Marland Kitchen.. 1 Inh Daily  Allergies: 1)  ! Cortisone Acetate (Cortisone Acetate) 2)  * Penicillins Group 3)  * Tetracycline Analogues Group 4)  * Acei  Past History:  Past medical, surgical, family and social histories (including risk factors) reviewed, and no changes noted (except as noted below).  Past Medical History: Reviewed history from 02/07/2007 and no changes required. GERD Hyperlipidemia Hypertension Osteoarthritis Osteopenia  Past Surgical History: Reviewed history from 07/16/2007 and no changes required. 1990 L arthroscopic knee surgery 1992 L knee cartilage repair 7/ 2008 L knee replacement  Family History:  Reviewed history from 02/07/2007 and no changes required. father: deceased age 64 MI, CAD mother: age 61 RA, osteoarthritis sister: DM grandparents: DM MGM: breast CA  Social History: Reviewed history from 02/07/2007 and no changes required. Patient is a former smoker.  Divorced, former smoker 25 pack year history,  occupation accounts payable, finance  Review of Systems General:  Denies fatigue and fever. CV:  Denies chest pain or discomfort. Resp:  Denies shortness of  breath. GI:  Denies abdominal pain, bloody stools, constipation, and diarrhea. GU:  Denies dysuria and incontinence. MS:  Denies loss of strength, muscle aches, cramps, and muscle weakness.  Physical Exam  General:  overweight appearing female in NAD Ears:  External ear exam shows no significant lesions or deformities.  Otoscopic examination reveals clear canals, tympanic membranes are intact bilaterally without bulging, retraction, inflammation or discharge. Hearing is grossly normal bilaterally. Nose:  External nasal examination shows no deformity or inflammation. Nasal mucosa are pink and moist without lesions or exudates. Mouth:  MMM Neck:  no carotid bruit or thyromegaly no cervical or supraclavicular lymphadenopathy  Lungs:  Normal respiratory effort, chest expands symmetrically. Lungs are clear to auscultation, no crackles or wheezes. Heart:  Normal rate and regular rhythm. S1 and S2 normal without gallop, murmur, click, rub or other extra sounds. Msk:  No lumbar spine ttp, neg SLR.  Pulses:  R and L posterior tibial pulses are full and equal bilaterally  Extremities:  mild swelling, no redness in 2nd left PIP, pain with flexion and ext, no triggering.  ligaments stable. Neurologic:  No cranial nerve deficits noted. Station and gait are normal. Sensory, motor and coordinative functions appear intact.  Diabetes Management Exam:    Foot Exam (with socks and/or shoes not present):       Sensory-Pinprick/Light touch:          Left medial foot (L-4): normal          Left dorsal foot (L-5): normal          Left lateral foot (S-1): normal          Right medial foot (L-4): normal          Right dorsal foot (L-5): normal          Right lateral foot (S-1): normal       Sensory-Monofilament:          Left foot: normal          Right foot: normal       Inspection:          Left foot: normal          Right foot: normal       Nails:          Left foot: normal          Right foot:  normal   Impression & Recommendations:  Problem # 1:  DIABETES MELLITUS, TYPE II (ICD-250.00) Well controlled. Continue current medication.  Her updated medication list for this problem includes:    Metformin Hcl 500 Mg Tabs (Metformin hcl) .Marland Kitchen... Take 2 tablet by mouth two times a day  Labs Reviewed: Creat: 0.6 (11/06/2009)    Reviewed HgBA1c results: 6.9 (11/06/2009)  7.3 (08/08/2009)  Problem # 2:  HYPERLIPIDEMIA (ICD-272.4)  Inadequate control of triglycerides. Encouraged exercise, weight loss, healthy eating habits. Recheck fasting LIPIDS, AST, ALT  in 3 months Dx 272.0    Her updated medication list for this problem includes:    Simvastatin 40 Mg  Tabs (Simvastatin) .Marland Kitchen... Take 1 tablet by mouth once a day  Labs Reviewed: SGOT: 23 (11/06/2009)   SGPT: 35 (11/06/2009)  Lipid Goals: Chol Goal: 200 (07/16/2007)   HDL Goal: 40 (07/16/2007)   LDL Goal: 100 (11/03/2008)   TG Goal: 150 (07/16/2007)  10 Yr Risk Heart Disease: 24 % Prior 10 Yr Risk Heart Disease: 9 % (11/06/2009)   HDL:42.80 (11/06/2009), 41.30 (05/08/2009)  LDL:69 (11/06/2009), 94 (05/08/2009)  Chol:137 (11/06/2009), 155 (05/08/2009)  Trig:124.0 (11/06/2009), 100.0 (05/08/2009)  Problem # 3:  FINGER PAIN (ICD-729.5) Last uric acid level nml when eval..CMC joint pain.  Now pain in 2nd PIP...no clear trigger finger.  Likely due to arhtritis, osteo given presence in other joints in past. Treat with NSAIDs. Follow up or referral to hand specialist if not imrpoving.   Problem # 4:  PERIPHERAL NEUROPATHY (ICD-356.9) Eval with labs...may be due to DM. No clear spinal source, but consider if work up neg.  Orders: TLB-CBC Platelet - w/Differential (85025-CBCD) TLB-TSH (Thyroid Stimulating Hormone) (84443-TSH) TLB-B12 + Folate Pnl (60454_09811-B14/NWG)  Complete Medication List: 1)  Tramadol Hcl 50 Mg Tabs (Tramadol hcl) .Marland Kitchen.. 1 tab by mouth daily as needed as needed knee pain 2)  Lyrica 100 Mg Caps (Pregabalin) .Marland Kitchen.. 1  by mouth three times a day 3)  Multivitamins Tabs (Multiple vitamin) .... Once daily 4)  Imitrex 50 Mg Tabs (Sumatriptan succinate) .... As needed migraines 5)  Amlodipine Besylate 5 Mg Tabs (Amlodipine besylate) .Marland Kitchen.. 1 tab by mouth daily 6)  Simvastatin 40 Mg Tabs (Simvastatin) .... Take 1 tablet by mouth once a day 7)  Metformin Hcl 500 Mg Tabs (Metformin hcl) .... Take 2 tablet by mouth two times a day 8)  Spiriva Handihaler 18 Mcg Caps (Tiotropium bromide monohydrate) .Marland Kitchen.. 1 inh daily 9)  Diclofenac Sodium 75 Mg Tbec (Diclofenac sodium) .Marland Kitchen.. 1 tab by mouth two times a day x as needed arthritis pain  Hypertension Assessment/Plan:      The patient's hypertensive risk group is category C: Target organ damage and/or diabetes.  Her calculated 10 year risk of coronary heart disease is 24 %.  Today's blood pressure is 140/82.  Her blood pressure goal is < 130/80.  Patient Instructions: 1)  Please schedule a follow-up appointment in 3 months .  2)  BMP prior to visit, ICD-9: 250.00 3)  Hepatic Panel prior to visit ICD-9:  4)  Lipid panel prior to visit ICD-9 :  5)  HgBA1c prior to visit  ICD-9:  Prescriptions: TRAMADOL HCL 50 MG TABS (TRAMADOL HCL) 1 tab by mouth daily as needed as needed knee pain  #30 x 0   Entered and Authorized by:   Kerby Nora MD   Signed by:   Kerby Nora MD on 03/12/2010   Method used:   Faxed to ...       [facility]* (mail-order)             ,          Ph: [phone]       Fax: [phone]   RxID:   [id number] TRAMADOL HCL 50 MG TABS (TRAMADOL HCL) 1 tab by mouth daily as needed as needed knee pain  #30 x 0   Entered and Authorized by:   Kerby Nora MD   Signed by:   Kerby Nora MD on 03/12/2010   Method used:   Print then Give to Patient   RxID:   [id number] DICLOFENAC SODIUM 75 MG TBEC (DICLOFENAC SODIUM) 1 tab  by mouth two times a day x as needed arthritis pain  #30 x 3   Entered and Authorized by:   Kerby Nora MD   Signed by:   Kerby Nora MD on 03/12/2010   Method used:   Electronically to        [facility]       [address]       [place], [place]  [address]       Ph: [phone]       Fax: [phone]   RxID:   [id number]   Current Allergies (reviewed today): ! CORTISONE ACETATE (CORTISONE ACETATE) * PENICILLINS GROUP * TETRACYCLINE ANALOGUES GROUP * ACEI

## 2010-12-30 ENCOUNTER — Other Ambulatory Visit (INDEPENDENT_AMBULATORY_CARE_PROVIDER_SITE_OTHER): Payer: BC Managed Care – PPO

## 2010-12-30 ENCOUNTER — Other Ambulatory Visit: Payer: Self-pay | Admitting: Family Medicine

## 2010-12-30 ENCOUNTER — Other Ambulatory Visit: Payer: Self-pay

## 2010-12-30 ENCOUNTER — Encounter (INDEPENDENT_AMBULATORY_CARE_PROVIDER_SITE_OTHER): Payer: Self-pay | Admitting: *Deleted

## 2010-12-30 DIAGNOSIS — E119 Type 2 diabetes mellitus without complications: Secondary | ICD-10-CM

## 2010-12-30 LAB — MICROALBUMIN / CREATININE URINE RATIO
Microalb Creat Ratio: 2.2 mg/g (ref 0.0–30.0)
Microalb, Ur: 2.3 mg/dL — ABNORMAL HIGH (ref 0.0–1.9)

## 2010-12-30 LAB — LIPID PANEL
LDL Cholesterol: 66 mg/dL (ref 0–99)
Total CHOL/HDL Ratio: 3
VLDL: 27.2 mg/dL (ref 0.0–40.0)

## 2010-12-30 LAB — HEPATIC FUNCTION PANEL
ALT: 30 U/L (ref 0–35)
AST: 24 U/L (ref 0–37)
Bilirubin, Direct: 0.1 mg/dL (ref 0.0–0.3)
Total Bilirubin: 0.4 mg/dL (ref 0.3–1.2)

## 2010-12-30 LAB — BASIC METABOLIC PANEL
Chloride: 106 mEq/L (ref 96–112)
GFR: 83.49 mL/min (ref >60.00)
Potassium: 5.1 mEq/L (ref 3.5–5.1)

## 2011-01-03 ENCOUNTER — Ambulatory Visit (INDEPENDENT_AMBULATORY_CARE_PROVIDER_SITE_OTHER): Payer: BC Managed Care – PPO | Admitting: Family Medicine

## 2011-01-03 ENCOUNTER — Encounter: Payer: Self-pay | Admitting: Family Medicine

## 2011-01-03 DIAGNOSIS — R809 Proteinuria, unspecified: Secondary | ICD-10-CM

## 2011-01-03 DIAGNOSIS — I10 Essential (primary) hypertension: Secondary | ICD-10-CM

## 2011-01-03 DIAGNOSIS — E785 Hyperlipidemia, unspecified: Secondary | ICD-10-CM

## 2011-01-03 DIAGNOSIS — E119 Type 2 diabetes mellitus without complications: Secondary | ICD-10-CM

## 2011-01-09 NOTE — Assessment & Plan Note (Signed)
Summary: 6 MTH  [facility]   Vital Signs:  Patient profile:   60 year old female Height:      65.5 inches Weight:      177.50 pounds BMI:     29.19 Temp:     97.9 degrees F oral Pulse rate:   76 / minute Pulse rhythm:   regular BP sitting:   120 / 70  (left arm) Cuff size:   regular  Vitals Entered By: Benny Lennert CMA Duncan Dull) (January 03, 2011 8:33 AM)  History of Present Illness: Chief complaint 6 month follow up  HTN, well controlled on amlodipine.   Mild microalbuminuria  High cholesterol, well controlled:on simvastatin   DM, well controlled : on metformin.   Has gotten treadmill.. plans to start regular exercsie.   Tramadol ineffective for knee pain... no further surgical treatment possible. Not needed very often.  Has tried meloxicam, diclofenac in past.. no relief.   Problems Prior to Update: 1)  Diarrhea  (ICD-787.91) 2)  Finger Pain  (ICD-729.5) 3)  Vitamin B12 Deficiency  (ICD-266.2) 4)  Peripheral Neuropathy  (ICD-356.9) 5)  Preventive Health Care  (ICD-V70.0) 6)  Copd, Mild  (ICD-496) 7)  Arthritis, Carpometacarpal Joint  (ICD-716.94) 8)  Carpal Tunnel Syndrome, Left  (ICD-354.0) 9)  Diabetes Mellitus, Type II  (ICD-250.00) 10)  Other Screening Mammogram  (ICD-V76.12) 11)  S/P Anes Opn/surg Arthrs Kne Jt Tot Kne Arthrp  (CPT-01402) 12)  Migraine, Classical w/o Intractable Migraine  (ICD-346.00) 13)  Knee Pain, Left, Chronic  (ICD-719.46) 14)  Osteopenia  (ICD-733.90) 15)  Osteoarthritis  (ICD-715.90) 16)  Hypertension  (ICD-401.9) 17)  Hyperlipidemia  (ICD-272.4) 18)  Gerd  (ICD-530.81)  Current Medications (verified): 1)  Tramadol Hcl 50 Mg Tabs (Tramadol Hcl) .Marland Kitchen.. 1 Tab By Mouth Daily As Needed As Needed Knee Pain 2)  Lyrica 150 Mg Caps (Pregabalin) .... Take One Tablet Three Times Dialy 3)  Multivitamins   Tabs (Multiple Vitamin) .... Once Daily 4)  Imitrex 50 Mg  Tabs (Sumatriptan Succinate) .... As Needed Migraines 5)  Amlodipine Besylate 5 Mg  Tabs (Amlodipine Besylate) .Marland Kitchen.. 1 Tab By Mouth Daily 6)  Simvastatin 40 Mg Tabs (Simvastatin) .... Take 1 Tablet By Mouth Once A Day 7)  Metformin Hcl 500 Mg Tabs (Metformin Hcl) .... Take 2 Tablet By Mouth Two Times A Day 8)  Spiriva Handihaler 18 Mcg Caps (Tiotropium Bromide Monohydrate) .Marland Kitchen.. 1 Inh Daily 9)  Diclofenac Sodium 75 Mg Tbec (Diclofenac Sodium) .Marland Kitchen.. 1 Tab By Mouth Two Times A Day X As Needed Arthritis Pain 10)  Enalapril Maleate 2.5 Mg Tabs (Enalapril Maleate) .Marland Kitchen.. 1 Tab By Mouth Daily  Allergies: 1)  ! Cortisone Acetate (Cortisone Acetate) 2)  * Penicillins Group 3)  * Tetracycline Analogues Group 4)  * Acei  Review of Systems General:  Denies fatigue and fever. CV:  Denies chest pain or discomfort. Resp:  Denies shortness of breath. GI:  Denies abdominal pain. GU:  Denies dysuria.  Physical Exam  General:  overweight appearing female in NAD Mouth:  MMM Neck:  no carotid bruit or thyromegaly no cervical or supraclavicular lymphadenopathy  Lungs:  Normal respiratory effort, chest expands symmetrically. Lungs are clear to auscultation, no crackles or wheezes. Heart:  Normal rate and regular rhythm. S1 and S2 normal without gallop,  2/6 sys murmur > RUSB, click, rub or other extra sounds. Pulses:  R and L posterior tibial pulses are full and equal bilaterally  Extremities:  no edema  Skin:  Intact  without suspicious lesions or rashes  Diabetes Management Exam:    Foot Exam (with socks and/or shoes not present):       Sensory-Pinprick/Light touch:          Left medial foot (L-4): normal          Left dorsal foot (L-5): normal          Left lateral foot (S-1): normal          Right medial foot (L-4): normal          Right dorsal foot (L-5): normal          Right lateral foot (S-1): normal       Sensory-Monofilament:          Left foot: normal          Right foot: normal       Inspection:          Left foot: normal          Right foot: normal       Nails:           Left foot: normal          Right foot: normal   Impression & Recommendations:  Problem # 1:  MICROALBUMINURIA (ICD-791.0) Assessment New Add ACEI for kidney protection.   Problem # 2:  DIABETES MELLITUS, TYPE II (ICD-250.00)  Well controlled. Continue current medication.  Her updated medication list for this problem includes:    Metformin Hcl 500 Mg Tabs (Metformin hcl) .Marland Kitchen... Take 2 tablet by mouth two times a day    Enalapril Maleate 2.5 Mg Tabs (Enalapril maleate) .Marland Kitchen... 1 tab by mouth daily  Labs Reviewed: Creat: 0.8 (12/30/2010)    Reviewed HgBA1c results: 6.8 (12/30/2010)  6.5 (06/19/2010)  Problem # 3:  HYPERTENSION (ICD-401.9)  Well controlled. Continue current medication.  Her updated medication list for this problem includes:    Amlodipine Besylate 5 Mg Tabs (Amlodipine besylate) .Marland Kitchen... 1 tab by mouth daily    Enalapril Maleate 2.5 Mg Tabs (Enalapril maleate) .Marland Kitchen... 1 tab by mouth daily  BP today: 120/70 Prior BP: 100/64 (06/26/2010)  Prior 10 Yr Risk Heart Disease: 11 % (06/26/2010)  Labs Reviewed: K+: 5.1 (12/30/2010) Creat: : 0.8 (12/30/2010)   Chol: 135 (12/30/2010)   HDL: 42.00 (12/30/2010)   LDL: 66 (12/30/2010)   TG: 136.0 (12/30/2010)  Problem # 4:  HYPERLIPIDEMIA (ICD-272.4)  Well controlled. Continue current medication.  Her updated medication list for this problem includes:    Simvastatin 40 Mg Tabs (Simvastatin) .Marland Kitchen... Take 1 tablet by mouth once a day  Labs Reviewed: SGOT: 24 (12/30/2010)   SGPT: 30 (12/30/2010)  Lipid Goals: Chol Goal: 200 (07/16/2007)   HDL Goal: 40 (07/16/2007)   LDL Goal: 100 (11/03/2008)   TG Goal: 150 (07/16/2007)  Prior 10 Yr Risk Heart Disease: 11 % (06/26/2010)   HDL:42.00 (12/30/2010), 41.40 (06/19/2010)  LDL:66 (12/30/2010), 62 (06/19/2010)  Chol:135 (12/30/2010), 136 (06/19/2010)  Trig:136.0 (12/30/2010), 162.0 (06/19/2010)  Complete Medication List: 1)  Tramadol Hcl 50 Mg Tabs (Tramadol hcl) .Marland Kitchen.. 1 tab by mouth  daily as needed as needed knee pain 2)  Lyrica 150 Mg Caps (Pregabalin) .... Take one tablet three times dialy 3)  Multivitamins Tabs (Multiple vitamin) .... Once daily 4)  Imitrex 50 Mg Tabs (Sumatriptan succinate) .... As needed migraines 5)  Amlodipine Besylate 5 Mg Tabs (Amlodipine besylate) .Marland Kitchen.. 1 tab by mouth daily 6)  Simvastatin 40 Mg Tabs (Simvastatin) .... Take 1 tablet by  mouth once a day 7)  Metformin Hcl 500 Mg Tabs (Metformin hcl) .... Take 2 tablet by mouth two times a day 8)  Spiriva Handihaler 18 Mcg Caps (Tiotropium bromide monohydrate) .Marland Kitchen.. 1 inh daily 9)  Diclofenac Sodium 75 Mg Tbec (Diclofenac sodium) .Marland Kitchen.. 1 tab by mouth two times a day x as needed arthritis pain 10)  Enalapril Maleate 2.5 Mg Tabs (Enalapril maleate) .Marland Kitchen.. 1 tab by mouth daily  Other Orders: Radiology Referral (Radiology)  Patient Instructions: 1)  Flax seed oil is a good source of omega threes. 2)  Schedule CPX in 3 months.. fasting A1C Dx 250.00. Prescriptions: ENALAPRIL MALEATE 2.5 MG TABS (ENALAPRIL MALEATE) 1 tab by mouth daily  #30 x 11   Entered and Authorized by:   Kerby Nora MD   Signed by:   Kerby Nora MD on 01/03/2011   Method used:   Electronically to        [facility] ([facility]       [address]       [place], [place]  [address]       Ph: [phone]       Fax: [phone]   RxID:   [id number]    Orders Added: 1)  Radiology Referral [Radiology] 2)  Est. Patient Level IV [84132]    Current Allergies (reviewed today): ! CORTISONE ACETATE (CORTISONE ACETATE) * PENICILLINS GROUP * TETRACYCLINE ANALOGUES GROUP * ACEI

## 2011-01-22 ENCOUNTER — Encounter: Payer: Self-pay | Admitting: Family Medicine

## 2011-01-22 ENCOUNTER — Ambulatory Visit: Payer: Self-pay | Admitting: Family Medicine

## 2011-01-24 ENCOUNTER — Encounter (INDEPENDENT_AMBULATORY_CARE_PROVIDER_SITE_OTHER): Payer: Self-pay | Admitting: *Deleted

## 2011-01-30 ENCOUNTER — Telehealth: Payer: Self-pay | Admitting: Family Medicine

## 2011-01-30 NOTE — Letter (Signed)
Summary: Results Follow up Letter  [place] [facility]  [address] [place], [place] [address]   Phone: [phone]  Fax: [phone]    01/24/2011 MRN: [id number]     Patricia Travis [address] [place], [place]  [address]  [place]    Dear Ms. Virrueta,  The following are the results of your recent test(s):  Test         Result    Pap Smear:        Normal _____  Not Normal _____ Comments: ______________________________________________________ Cholesterol: LDL(Bad cholesterol):         Your goal is less than:         HDL (Good cholesterol):       Your goal is more than: Comments:  ______________________________________________________ Mammogram:        Normal __x___  Not Normal _____ Comments:Repeat in 1 year  ___________________________________________________________________ Hemoccult:        Normal _____  Not normal _______ Comments:    _____________________________________________________________________ Other Tests:    We routinely do not discuss normal results over the telephone.  If you desire a copy of the results, or you have any questions about this information we can discuss them at your next office visit.   Sincerely,  Kerby Nora MD

## 2011-02-04 NOTE — Progress Notes (Signed)
Summary: enalapril  Phone Note Call from Patient   Caller: Patient Call For: Kerby Nora MD Summary of Call: Patient says that she has stopped taking the enalapril because it was making her cough and have a scratchy throat. She is asking if she could try something else. Uses [facility] [address]  Initial call taken by: Melody Comas,  January 30, 2011 8:50 AM  Follow-up for Phone Call        WIll cahnge to losartan low dose Follow-up by: Kerby Nora MD,  January 30, 2011 4:13 PM    New/Updated Medications: LOSARTAN POTASSIUM 25 MG TABS (LOSARTAN POTASSIUM) 1/2 tab by mouth daily Prescriptions: LOSARTAN POTASSIUM 25 MG TABS (LOSARTAN POTASSIUM) 1/2 tab by mouth daily  #30 x 6   Entered and Authorized by:   Kerby Nora MD   Signed by:   Kerby Nora MD on 01/30/2011   Method used:   Electronically to        [facility]       [address]       [place], [place]  [address]       Ph: [phone]       Fax: [phone]   RxID:   [id number]

## 2011-04-09 ENCOUNTER — Other Ambulatory Visit: Payer: BC Managed Care – PPO

## 2011-04-11 NOTE — Assessment & Plan Note (Signed)
[facility]                             [place] OFFICE NOTE   Patricia Travis, Patricia Travis                    MRN:          [id number]  DATE:                                      DOB:          1951/06/11    NEW PATIENT HISTORY AND PHYSICAL   CHIEF COMPLAINT:  Establish new doctor.   HISTORY OF PRESENT ILLNESS:  1.  Left knee pain, chronic:  Patricia Travis reports that one of the reasons      that she is trying to establish a new physician today is because her      last physician, Dr. Cristopher Estimable, changed her from Darvocet for knee pain      to Tramadol.  She states that her left knee pain has been stable ever      since the initial injury in 1990, which was a traumatic injury.      Following that, she had an orthoscopic knee surgery for cartilage damage      and resulting osteoarthritis followed by another surgery in 1992.  These      surgeries were done by Dr. Marylene Buerger, but she now sees an orthopedist at      [facility], Dr. Malka So.  She has not seen the orthopedist for about a      year and a half because she had been doing well.  Several months ago,      her previous primary care doctor had concern that she had been on      Darvocet consistently since 2003.  She used to take Darvocet at 100      twice daily.  The patient does not feel that she had a problem with      addiction to this medication or abuse.  She had been consistently on      that dose for many years ever since being started on it by her      orthopedic surgeon.  2.  Hypercholesterolemia:  She has recently had her cholesterol checked at      her last doctor and she states that it was slightly elevated.  She      continues to take Vytorin.  She has no side effects from this      medication.  3.  Hypertension, stable:  She states that when she measures her blood      pressure at work, it is usually less than 140/90.  She denies any chest      pain, shortness of breath, headache.   PAST MEDICAL HISTORY:  1.  Chronic left knee pain.  2.  Hypercholesterolemia.  3.  Hypertension.  4.  Migraines treated by Dr. Neale Burly.  5.  Gastroesophageal reflux disease.  6.  Osteoarthritis, hands.   PAST SURGICAL HISTORY:  1.  Hysterectomy in 1986.  2.  Arthroscopic knee surgery in 1990.  3.  Knee surgery, cartilage repair and replacement in 1992.   HEALTH MAINTENANCE:  Last mammogram, January 2007, last Pap smear many years  ago secondary to hysterectomy.   SOCIAL HISTORY:  No smoking  but has a history of being a former smoker for  over 25 years.  She quit 13 years ago.  She denies alcohol use or history of  drug use.  She is single, divorced, she has one son, who lives in [place], who is in the [employer].  She works in Advertising account executive.  She  exercises three times a week but is limited significantly by her left knee  pain.   FAMILY HISTORY:  Father deceased at age 67 from coronary artery disease and  a heart attack, mother still living at age 96 with rheumatoid arthritis and  osteoarthritis.  There is breast cancer in the family in her grandmother and  aunt, but no first-degree relatives have this.  There is high blood  pressure, cardiovascular disease, and diabetes running in her family in her  grandparents and her sister.   MEDICATIONS:  1.  Tramadol p.o. q.h.s.  2.  Cyclobenzaprine 10 mg p.o. q.h.s.  3.  Lyrica 100 mg p.o. t.i.d.  4.  Vytorin 10 to 20 mg one p.o. q.h.s.  5.  Multivitamin.   ALLERGIES:  1.  PENICILLIN.  2.  TETRACYCLINE.  3.  CORTISONE.   PHYSICAL EXAMINATION:  VITAL SIGNS:  Weight 175, temperature 97.8, pulse 80,  blood pressure 140/90.  GENERAL:  Overweight-appearing female in no apparent distress.  HEENT:  PERRLA.  Oropharynx clear.  Nares clear.  NECK:  No lymphadenopathy in neck or supraclavicular.  PULMONARY:  Clear to auscultation bilaterally.  No wheezes, rales, or  rhonchi.  CARDIOVASCULAR:  Regular rate and rhythm.   No murmurs, rubs, or gallops.  Normal PMI.  ABDOMEN:  Normoactive bowel sounds, no hepatosplenomegaly, nontender to  palpation.  MUSCULOSKELETAL:  Homero Fellers 5 out 5 in upper and lower extremities.  Full range  of motion in all joints except left knee, full extension but limited to 90  degrees of flexion secondary to tenderness, left knee also has some slight  edema and is tender to palpation over medial joint space, no erythema, old  scar over lateral left knee, reflexes 2+ in upper and lower extremities,  pulses 2+ bilaterally.   ASSESSMENT AND PLAN:  1.  Left knee pain, chronic:  I would like to obtain records from her      doctors, both her orthopedic and her last primary care doctor, to make      sure there is no substance abuse issue with this patient given that her      first request is for Darvocet.  She will return in two to three weeks      after records are received and we can discuss pain control at that time      and consideration of referring her to a chronic pain center since this      is definitely chronic pain.  2.  Hypertension, mild elevation:  We will follow this at next office visit.      She will continue low salt diet and was instructed to avoid      antiinflammatories such as ibuprofen.  3.  Hypercholesterolemia, stable:  This was recently checked and will try to      obtain results.  She will continue Vytorin 10/20 mg one p.o. q.h.s.                                   Kerby Nora, MD   AB/MedQ  DD:  06/15/2006  DT:  06/15/2006  Job #:  [id number]

## 2011-04-13 ENCOUNTER — Other Ambulatory Visit: Payer: Self-pay | Admitting: Family Medicine

## 2011-04-14 ENCOUNTER — Encounter: Payer: BC Managed Care – PPO | Admitting: Family Medicine

## 2011-04-25 ENCOUNTER — Other Ambulatory Visit: Payer: Self-pay | Admitting: Family Medicine

## 2011-06-02 ENCOUNTER — Other Ambulatory Visit: Payer: Self-pay | Admitting: Family Medicine

## 2011-06-03 ENCOUNTER — Other Ambulatory Visit (INDEPENDENT_AMBULATORY_CARE_PROVIDER_SITE_OTHER): Payer: BC Managed Care – PPO | Admitting: Family Medicine

## 2011-06-03 DIAGNOSIS — E119 Type 2 diabetes mellitus without complications: Secondary | ICD-10-CM

## 2011-06-04 ENCOUNTER — Encounter: Payer: Self-pay | Admitting: Family Medicine

## 2011-06-10 ENCOUNTER — Encounter: Payer: Self-pay | Admitting: Family Medicine

## 2011-06-10 ENCOUNTER — Ambulatory Visit (INDEPENDENT_AMBULATORY_CARE_PROVIDER_SITE_OTHER): Payer: BC Managed Care – PPO | Admitting: Family Medicine

## 2011-06-10 DIAGNOSIS — E119 Type 2 diabetes mellitus without complications: Secondary | ICD-10-CM

## 2011-06-10 DIAGNOSIS — E785 Hyperlipidemia, unspecified: Secondary | ICD-10-CM

## 2011-06-10 DIAGNOSIS — I10 Essential (primary) hypertension: Secondary | ICD-10-CM

## 2011-06-10 DIAGNOSIS — E538 Deficiency of other specified B group vitamins: Secondary | ICD-10-CM

## 2011-06-10 DIAGNOSIS — G609 Hereditary and idiopathic neuropathy, unspecified: Secondary | ICD-10-CM

## 2011-06-10 MED ORDER — GABAPENTIN 300 MG PO CAPS: ORAL_CAPSULE | ORAL | Status: DC

## 2011-06-10 NOTE — Patient Instructions (Addendum)
B12 1000 mcg daily. Increase gabapentin to 300 mg in AM and continue 600 mg at night. If minimal improvement but no side effects increase to 300 mg in AM and midday and continue 600 mg at night. Given at least one month for leg symptoms to improve. Follow up in 3 months, but if legs not any better, you can make earlier appt to be seen.  If pain improved... Try to work on weight loss and exercise. Woirk on low carb diet aggressively for better blood sugar control.  Consider looking into shingles vaccine coverage.

## 2011-06-10 NOTE — Assessment & Plan Note (Signed)
Well controlled. Continue current medication.  

## 2011-06-10 NOTE — Assessment & Plan Note (Signed)
Slight worsening in last 6 months. Encouraged pt to get better control to assist with peripheral neuropathy. Encouraged exercise, weight loss, healthy eating habits. She will continue metformin but refused addition of januvia unless elevated at next OV.

## 2011-06-10 NOTE — Assessment & Plan Note (Addendum)
CHronic diagnosis for pt but she states she was not aware of what this was.  She definitely has neuropahty. Wpork up with labs in past has only revealed B12 def. She will start back B12 vitamine OTC.  She has never had peripheral nerve eval to make peripheral source definitaive, but no symptoms of central issue.  She is on gabapentin for migraine, we will increase this  (did not have any sedation SE to this med when starting). INcrease 300 mg in AM and in mid day if needed. If not improving consider nerve conduction for further eval.  Likely primary cause inadequate control DM and B12 deficiency, no past ETOH use Will follow up at next appt for DM.

## 2011-06-10 NOTE — Progress Notes (Signed)
Subjective:    Patient ID: Patricia Travis, female    DOB: 08-Feb-1951, 60 y.o.   MRN: [id number]  HPI  The patient is here for multiple medical issues  Diabetes:  Slight worsening from last check. On metformin,. Using medications without difficulties: Hypoglycemic episodes:None Hyperglycemic episodes:None Feet problems:YES see beow Blood Sugars averaging: daily, at home 99-115 She is not interested in additional medication at this time, minimal exercise, moderate diet.  Hypertension:  At goal <140/90 on losartan  Using medication without problems or lightheadedness:  Chest pain with exertion: None Edema:None Short of breath:None Average home BPs: Other issues:  Elevated Cholesterol: previously well controlled at check 12/2010 on zocor 40 mg daily Using medications without problems: Muscle aches:  Yes  Other complaints: Both legs hurt shin to ankle at night, shooting, stabbing pain.  Wakes her up at night.  Chronic knee pain: on tramadol.  COPD, stable on Spiriva.  Peripheral neuropathy: Burning in feet worsening Keeps her up at night, also pain during the day. For years, worse in last 6 months.  On gabapentin for migraine but not helping for feet. Has tried Lyrica for migraine but did not notice improvement of foot issues. Labs in past showed mild B12 def, nml cbc and nml tsh. She is not alcoholic and has moderate control of DM, could be better. Never had nerve conduction test, no back pain or central symptoms.   Mother recently diagnosised  With periphjeral neuropathy unknown cause.  We did review prevention in detail  No pap or DRE given total hysterectomy.    Review of Systems  Constitutional: Negative for fever, fatigue and unexpected weight change.  HENT: Negative for ear pain, congestion, sore throat, sneezing, trouble swallowing and sinus pressure.   Eyes: Negative for pain and itching.  Respiratory: Negative for cough, shortness of breath and wheezing.     Cardiovascular: Negative for chest pain, palpitations and leg swelling.  Gastrointestinal: Negative for nausea, abdominal pain, diarrhea, constipation and blood in stool.  Genitourinary: Negative for dysuria, hematuria, vaginal discharge, difficulty urinating and menstrual problem.  Musculoskeletal: Negative for back pain.  Skin: Negative for rash.  Neurological: Positive for numbness. Negative for syncope, weakness, light-headedness and headaches.  Psychiatric/Behavioral: Negative for confusion and dysphoric mood. The patient is not nervous/anxious.        Objective:   Physical Exam  Constitutional: Vital signs are normal. She appears well-developed and well-nourished. She is cooperative.  Non-toxic appearance. She does not appear ill. No distress.  HENT:  Head: Normocephalic.  Right Ear: Hearing, tympanic membrane, external ear and ear canal normal.  Left Ear: Hearing, tympanic membrane, external ear and ear canal normal.  Nose: Nose normal.  Eyes: Conjunctivae, EOM and lids are normal. Pupils are equal, round, and reactive to light. No foreign bodies found.  Neck: Trachea normal and normal range of motion. Neck supple. Carotid bruit is not present. No mass and no thyromegaly present.  Cardiovascular: Normal rate, regular rhythm, S1 normal, S2 normal, normal heart sounds and intact distal pulses.  Exam reveals no gallop.   No murmur heard. Pulmonary/Chest: Effort normal and breath sounds normal. No respiratory distress. She has no wheezes. She has no rhonchi. She has no rales.  Abdominal: Soft. Normal appearance and bowel sounds are normal. She exhibits no distension, no fluid wave, no abdominal bruit and no mass. There is no hepatosplenomegaly. There is no tenderness. There is no rebound, no guarding and no CVA tenderness. No hernia.  Genitourinary: No breast swelling, tenderness, discharge  or bleeding. Pelvic exam was performed with patient prone.  Musculoskeletal:       Anterior  shins ttp, no calf pain  does have B varicose veins, moderate  Lymphadenopathy:    She has no cervical adenopathy.    She has no axillary adenopathy.  Neurological: She is alert. She has normal strength. No cranial nerve deficit or sensory deficit.       Points to bottoms of feet for area of burning  Skin: Skin is warm, dry and intact. No rash noted.  Psychiatric: Her speech is normal and behavior is normal. Judgment normal. Her mood appears not anxious. Cognition and memory are normal. She does not exhibit a depressed mood.      Diabetic foot exam: Normal inspection No skin breakdown No calluses  Normal DP pulses Normal sensation to light touch and monofilament Nails normal     Assessment & Plan:  Prevention reviewed, but not primary diagnosis: The patient's preventative maintenance and recommended screening tests for an annual wellness exam were reviewed in full today. Brought up to date unless services declined.  Counselled on the importance of diet, exercise, and its role in overall health and mortality. The patient's FH and SH was reviewed, including their home life, tobacco status, and drug and alcohol status.    Nml Bone density 3/ 2011  Colon nml in 2006, rpt 10  No pap or DRE given total hysterectomy.  Refuses shingles, UTD with TD.

## 2011-06-10 NOTE — Assessment & Plan Note (Addendum)
At goal last check on simvastatin.

## 2011-06-27 ENCOUNTER — Other Ambulatory Visit: Payer: Self-pay | Admitting: Family Medicine

## 2011-07-03 ENCOUNTER — Other Ambulatory Visit: Payer: Self-pay | Admitting: Family Medicine

## 2011-07-03 NOTE — Telephone Encounter (Signed)
refilled 

## 2011-07-21 ENCOUNTER — Ambulatory Visit (INDEPENDENT_AMBULATORY_CARE_PROVIDER_SITE_OTHER): Payer: BC Managed Care – PPO | Admitting: Family Medicine

## 2011-07-21 VITALS — BP 130/80 | HR 74 | Temp 97.8°F | Ht 65.5 in | Wt 176.4 lb

## 2011-07-21 DIAGNOSIS — R42 Dizziness and giddiness: Secondary | ICD-10-CM

## 2011-07-21 MED ORDER — MECLIZINE HCL 25 MG PO TABS: 25.0000 mg | ORAL_TABLET | Freq: Three times a day (TID) | ORAL | Status: DC | PRN

## 2011-07-21 MED ORDER — MECLIZINE HCL 25 MG PO TABS: 25.0000 mg | ORAL_TABLET | Freq: Three times a day (TID) | ORAL | Status: AC | PRN

## 2011-07-21 MED ORDER — DIAZEPAM 2 MG PO TABS: 2.0000 mg | ORAL_TABLET | Freq: Four times a day (QID) | ORAL | Status: AC | PRN

## 2011-07-21 NOTE — Progress Notes (Signed)
  Subjective:    Patient ID: Patricia Travis, female    DOB: 1951/08/11, 60 y.o.   MRN: [id number]  HPI  Patricia Travis, a 60 y.o. female presents today in the office for the following:    Dizziness: Feels like her head is spinning. Hard to focus her eyes.  Feels mostly lightheaded Feels off balance some with walking  99-119. Does not feel like hypoglycemia.   No palpitations. No CP, No SOB.  Orthostatics normal No bleeding.  No neurological changes. Thinking clearly. No numbness. No weakness. No slurred speech.  CBC:    Component Value Date/Time   WBC 8.8 06/26/2010 0911   HGB 13.3 06/26/2010 0911   HCT 40.0 06/26/2010 0911   PLT 214.0 06/26/2010 0911   MCV 89.1 06/26/2010 0911   NEUTROABS 5.9 06/26/2010 0911   LYMPHSABS 2.2 06/26/2010 0911   MONOABS 0.4 06/26/2010 0911   EOSABS 0.2 06/26/2010 0911   BASOSABS 0.0 06/26/2010 0911   Comprehensive Metabolic Panel:    Component Value Date/Time   NA 144 12/30/2010 0847   K 5.1 12/30/2010 0847   CL 106 12/30/2010 0847   CO2 30 12/30/2010 0847   BUN 11 12/30/2010 0847   CREATININE 0.8 12/30/2010 0847   GLUCOSE 104* 12/30/2010 0847   CALCIUM 8.8 12/30/2010 0847   AST 24 12/30/2010 0847   ALT 30 12/30/2010 0847   ALKPHOS 89 12/30/2010 0847   BILITOT 0.4 12/30/2010 0847   PROT 6.7 12/30/2010 0847   ALBUMIN 3.9 12/30/2010 0847    The PMH, PSH, Social History, Family History, Medications, and allergies have been reviewed in [facility], and have been updated if relevant.  Review of Systems ROS: GEN: Acute illness details above GI: Tolerating PO intake GU: maintaining adequate hydration and urination Pulm: No SOB Interactive and getting along well at home.  Otherwise, ROS is as per the HPI.     Objective:   Physical Exam   Physical Exam  Blood pressure 130/80, pulse 74, temperature 97.8 F (36.6 C), height 5' 5.5" (1.664 m), weight 176 lb 6.4 oz (80.015 kg), SpO2 97.00%.  GEN: WDWN, NAD, Non-toxic, A & O x 3 HEENT: Atraumatic, Normocephalic. Neck supple.  No masses, No LAD. Ears and Nose: No external deformity. While sitting up with rotational motion, symptoms reproduced. While supine, with head rotation, symptoms reproduced. No nystagmus. CV: RRR, no m/g/r Pulm: CTA B. No wheezes EXTR: No c/c/e NEURO Normal gait.  PSYCH: Normally interactive. Conversant. Not depressed or anxious appearing.  Calm demeanor.       Assessment & Plan:   1. Vertigo  diazepam (VALIUM) 2 MG tablet, meclizine (ANTIVERT) 25 MG tablet, DISCONTINUED: meclizine (ANTIVERT) 25 MG tablet   Inducible on exam. No red flags. For now, would treat symptomatically for the next few weeks. If continues can refer to ENT vs vestib rehab

## 2011-08-06 ENCOUNTER — Telehealth: Payer: Self-pay | Admitting: *Deleted

## 2011-08-06 NOTE — Telephone Encounter (Signed)
Patient says her grandson threw away her glucose meter this weekend and she didn't notice that it was gone until the trash had been picked up.  She is requesting a new meter and lancing pen and meter battery.  I filled in the answers to all the questions that I could, leaving just a few for you and your signature.  Form is in your in box.  Please fax.

## 2011-08-06 NOTE — Telephone Encounter (Signed)
Will complete form when back in office on Thursday.

## 2011-08-18 ENCOUNTER — Other Ambulatory Visit: Payer: Self-pay | Admitting: Family Medicine

## 2011-08-18 NOTE — Telephone Encounter (Signed)
Forward to PCP.

## 2011-08-19 NOTE — Telephone Encounter (Signed)
Form printed from EMR and refaxed to [facility].

## 2011-08-23 ENCOUNTER — Other Ambulatory Visit: Payer: Self-pay | Admitting: Family Medicine

## 2011-09-08 ENCOUNTER — Other Ambulatory Visit (INDEPENDENT_AMBULATORY_CARE_PROVIDER_SITE_OTHER): Payer: BC Managed Care – PPO

## 2011-09-08 DIAGNOSIS — E119 Type 2 diabetes mellitus without complications: Secondary | ICD-10-CM

## 2011-09-08 DIAGNOSIS — E538 Deficiency of other specified B group vitamins: Secondary | ICD-10-CM

## 2011-09-12 ENCOUNTER — Encounter: Payer: Self-pay | Admitting: Family Medicine

## 2011-09-12 ENCOUNTER — Ambulatory Visit (INDEPENDENT_AMBULATORY_CARE_PROVIDER_SITE_OTHER): Payer: BC Managed Care – PPO | Admitting: Family Medicine

## 2011-09-12 DIAGNOSIS — H9319 Tinnitus, unspecified ear: Secondary | ICD-10-CM | POA: Insufficient documentation

## 2011-09-12 DIAGNOSIS — G609 Hereditary and idiopathic neuropathy, unspecified: Secondary | ICD-10-CM

## 2011-09-12 DIAGNOSIS — E538 Deficiency of other specified B group vitamins: Secondary | ICD-10-CM

## 2011-09-12 DIAGNOSIS — E119 Type 2 diabetes mellitus without complications: Secondary | ICD-10-CM

## 2011-09-12 MED ORDER — CYANOCOBALAMIN 1000 MCG/ML IJ SOLN
1000.0000 ug | Freq: Once | INTRAMUSCULAR | Status: AC
  Administered 2011-09-12: 1000 ug via INTRAMUSCULAR

## 2011-09-12 NOTE — Assessment & Plan Note (Signed)
Not improving on B12 orally. Will begin injections monthly. MAy be contributing to peripheral neuropathy and tinnitus.

## 2011-09-12 NOTE — Assessment & Plan Note (Addendum)
May be related to B12 def... She is on ACEI and NSAID that may worsen this issue. No vascular component described.  Hearing test today: If not improving, consider ENT referral.

## 2011-09-12 NOTE — Assessment & Plan Note (Signed)
Due to DMa nd B12 def... No clear sign of central cause. Consider return to lyrica in place of gagbapentin given SE with higher dose gabapentin. B12 injection given today. She will discuss with migraine MD.. Dr. Neale Burly. She will also discuss need for nerve conduction with him.

## 2011-09-12 NOTE — Progress Notes (Signed)
Subjective:    Patient ID: Patricia Travis, female    DOB: Dec 04, 1950, 60 y.o.   MRN: [id number]  HPI  Diabetes: Improved control. On metformin,. Using medications without difficulties:  Hypoglycemic episodes:None  Hyperglycemic episodes:None  Feet problems:YES see beow  Blood Sugars averaging: daily, at home 99-115  Improved exercise and diet.   Hypertension: At goal <140/90 on losartan  Using medication without problems or lightheadedness:  Chest pain with exertion: None  Edema:None  Short of breath:None  Average home BPs:  Other issues:   Elevated Cholesterol: previously well controlled at check 12/2010 on zocor 40 mg daily  Using medications without problems:  Muscle aches: Yes   Other complaints: Both legs hurt shin to ankle at night, shooting, stabbing pain.  Wakes her up at night.  Chronic knee pain: on tramadol.   Peripheral neuropathy:  Tried higher dose of gabapentin...tried for 2 days.. Made her feel very fatigued so stopped. She would like to try lyrica again for migraines as well as for peripheral neuropathy... Leg issues we better controlled on this. She plans to discuss Labs in past showed mild B12 def, nml cbc and nml tsh. She is not alcoholic and has moderate control of DM, could be better.  Never had nerve conduction test, no back pain or central symptoms.  Mother recently diagnosised With periphjeral neuropathy unknown cause. B12 is low again...recommend B12 injections.  Has been hearing hiss in ears for last few years, constant. No pulse beat sound. Occ vertigo (thought to be BPPV, getting better), no hearing loss. No family history.  Review of Systems  Constitutional: Negative for fever and fatigue.  HENT: Negative for ear pain.   Eyes: Negative for pain.  Respiratory: Negative for chest tightness and shortness of breath.   Cardiovascular: Negative for chest pain, palpitations and leg swelling.  Gastrointestinal: Negative for abdominal pain.    Genitourinary: Negative for dysuria.       Objective:   Physical Exam  Constitutional: Vital signs are normal. She appears well-developed and well-nourished. She is cooperative.  Non-toxic appearance. She does not appear ill. No distress.  HENT:  Head: Normocephalic.  Right Ear: Hearing, tympanic membrane, external ear and ear canal normal. Tympanic membrane is not erythematous, not retracted and not bulging.  Left Ear: Hearing, tympanic membrane, external ear and ear canal normal. Tympanic membrane is not erythematous, not retracted and not bulging.  Nose: No mucosal edema or rhinorrhea. Right sinus exhibits no maxillary sinus tenderness and no frontal sinus tenderness. Left sinus exhibits no maxillary sinus tenderness and no frontal sinus tenderness.  Mouth/Throat: Uvula is midline, oropharynx is clear and moist and mucous membranes are normal.  Eyes: Conjunctivae, EOM and lids are normal. Pupils are equal, round, and reactive to light. No foreign bodies found.  Neck: Trachea normal and normal range of motion. Neck supple. Carotid bruit is not present. No mass and no thyromegaly present.  Cardiovascular: Normal rate, regular rhythm, S1 normal, S2 normal, normal heart sounds, intact distal pulses and normal pulses.  Exam reveals no gallop and no friction rub.   No murmur heard. Pulmonary/Chest: Effort normal and breath sounds normal. Not tachypneic. No respiratory distress. She has no decreased breath sounds. She has no wheezes. She has no rhonchi. She has no rales.  Abdominal: Soft. Normal appearance and bowel sounds are normal. There is no tenderness.  Neurological: She is alert.  Skin: Skin is warm, dry and intact. No rash noted.  Psychiatric: Her speech is normal and behavior is  normal. Judgment and thought content normal. Her mood appears not anxious. Cognition and memory are normal. She does not exhibit a depressed mood.   Diabetic foot exam: Normal inspection No skin breakdown No  calluses  Normal DP pulses Decreased sensation to light touch and monofilament Nails normal       Assessment & Plan:

## 2011-09-12 NOTE — Assessment & Plan Note (Signed)
Improved control with lifestyle changes.Encouraged exercise, weight loss, healthy eating habits.

## 2011-09-12 NOTE — Patient Instructions (Addendum)
I agree with conversation with Dr. Neale Burly about lyrica and peripheral neuropathy. He may want to consider nerve conduction test to eval if he still does these. Also let him know that we think the cause is likely diabetes and B 12 deficiency. Return in 1 month for B12 injection with nurse visit. If hissing in ears not improving...try to stop diclofenac as this may be worsening issue. We may also need to hold losartan.

## 2011-09-15 ENCOUNTER — Telehealth: Payer: Self-pay | Admitting: *Deleted

## 2011-09-15 NOTE — Telephone Encounter (Signed)
Patient called stating that when she was seen Friday it was recommended that she start taking B-12 injections. Patient called to let you know that the nurse at [facility] agreed to give her the shots at work. Patient would like to pick up a written rx for this medication.  Please let her know when the script is ready.

## 2011-09-17 ENCOUNTER — Other Ambulatory Visit: Payer: Self-pay | Admitting: Family Medicine

## 2011-09-18 NOTE — Telephone Encounter (Signed)
Patient notified as instructed by telephone. Prescription left at front desk.  

## 2011-09-18 NOTE — Telephone Encounter (Signed)
Rx written to fax.. In my outbox.

## 2011-09-22 ENCOUNTER — Telehealth: Payer: Self-pay | Admitting: *Deleted

## 2011-09-22 MED ORDER — PREDNISONE 10 MG PO TABS: ORAL_TABLET | ORAL | Status: DC

## 2011-09-22 NOTE — Telephone Encounter (Signed)
Addended by: Kerby Nora E on: 09/22/2011 05:14 PM   Modules accepted: Orders

## 2011-09-22 NOTE — Telephone Encounter (Signed)
Pt c/o L sided arm, neck, and shoulder pain since getting b12 shot a couple of weeks ago. She denies redness, swelling, heat at site and fever. Only symptom is pain. Pt want's to know what you would advise her take for pain.

## 2011-09-22 NOTE — Telephone Encounter (Signed)
appt made for patient

## 2011-09-22 NOTE — Telephone Encounter (Signed)
Pt cancelled appt.. Will call with update in Wednesday.  Wishes to start prednisone taper for possible reaction to medication.

## 2011-09-22 NOTE — Telephone Encounter (Signed)
Patient has been taking this already. Is their anything else she can do

## 2011-09-22 NOTE — Telephone Encounter (Signed)
Does not sound like infection or allergic reaction. I am not sure why she is having reaction... ? muscle irritation?  Can use the diclofenac she has form pain. Let me know if she does not have this.  Have her let me know if not improving in next few weeks. Or earlier if redness, swelling, heat at site and fever.

## 2011-09-22 NOTE — Telephone Encounter (Signed)
Patient wants to try prednisone

## 2011-09-23 ENCOUNTER — Other Ambulatory Visit: Payer: Self-pay | Admitting: *Deleted

## 2011-09-23 ENCOUNTER — Telehealth: Payer: Self-pay | Admitting: *Deleted

## 2011-09-23 MED ORDER — PREDNISONE 10 MG PO TABS: ORAL_TABLET | ORAL | Status: DC

## 2011-09-23 NOTE — Telephone Encounter (Signed)
Patient called this morning very upset and screaming at me because her prednisone did not get called in yesterday afternoon. I looked back at phone note and Rx was sent to [facility] instead of the local pharmacy. I called [facility] and called rx and then sent it to [facility]. Patient says that we are just making stupid mistakes. I explained that it was just a mistake and that I was sorry that this happened and was happy to send it to the right pharmacy.Patient was still upset when she said "thanks" and hung up the phone.

## 2011-09-23 NOTE — Telephone Encounter (Signed)
This was an error on my part. I will forward to [facility] to see if she thinks she needs to call this pt.

## 2011-09-25 ENCOUNTER — Encounter: Payer: Self-pay | Admitting: Family Medicine

## 2011-09-25 ENCOUNTER — Ambulatory Visit (INDEPENDENT_AMBULATORY_CARE_PROVIDER_SITE_OTHER): Payer: BC Managed Care – PPO | Admitting: Family Medicine

## 2011-09-25 DIAGNOSIS — M79609 Pain in unspecified limb: Secondary | ICD-10-CM

## 2011-09-25 DIAGNOSIS — R200 Anesthesia of skin: Secondary | ICD-10-CM | POA: Insufficient documentation

## 2011-09-25 DIAGNOSIS — M79602 Pain in left arm: Secondary | ICD-10-CM | POA: Insufficient documentation

## 2011-09-25 DIAGNOSIS — R209 Unspecified disturbances of skin sensation: Secondary | ICD-10-CM

## 2011-09-25 MED ORDER — OXYCODONE-ACETAMINOPHEN 5-325 MG PO TABS: 1.0000 | ORAL_TABLET | ORAL | Status: DC | PRN

## 2011-09-25 NOTE — Patient Instructions (Signed)
Use percocet for pain. Stop tramadol while on. Consider completing prednisone course.  Apply ice, massage arm as able If not improving in next 2 weeks.. Call for further eval of possibility of cervical radicular pain. We can consider having you see Dr. Neale Burly earlier.

## 2011-09-25 NOTE — Assessment & Plan Note (Addendum)
Temporaly related to injection. No sign of allergic response or infection. No abcess palpated.  Appears more like ? Myalgia SE to B12 vs other doagnosis altogether.  Consistent with possible cervical radiculopathy... If not improving consider further work up of this possibility.  Pt does have an appt with neurologist for migraine in 10/2011.

## 2011-09-25 NOTE — Progress Notes (Signed)
  Subjective:    Patient ID: Patricia Travis, female    DOB: May 10, 1951, 60 y.o.   MRN: [id number]  HPI  60 year old female with pain in her left arm  following B12 injection on 10/19th.  She reports it began as an constantache started 15 minutes. Felt stiff neck.  Constant pain in entire arm, pain up to neck and shoulders off and on numbness  In arm to fingertips. No weakness.  She reports pain is severe enough that she is having difficulty working with pain.  She feels the pain is getting worse.  No relief with heat, ice, OTC ibuprofen, ASA etc.  Diclofenac and tramadol has not helped.   Prednisone given emperically for possible allergic response or inflammation did not help after 2 doses.     Review of Systems  Constitutional: Negative for fever and fatigue.  HENT: Negative for ear pain.   Eyes: Negative for pain.  Respiratory: Negative for shortness of breath and wheezing.   Cardiovascular: Negative for chest pain, palpitations and leg swelling.  Musculoskeletal:       Pain in neck       Objective:   Physical Exam  Constitutional: She appears well-developed and well-nourished.  HENT:  Head: Normocephalic and atraumatic.  Right Ear: External ear normal.  Left Ear: External ear normal.  Nose: Nose normal.  Mouth/Throat: Oropharynx is clear and moist.  Eyes: Conjunctivae and EOM are normal. Pupils are equal, round, and reactive to light.  Neck: Neck supple. Spinous process tenderness and muscular tenderness present. Carotid bruit is not present. Decreased range of motion present.       Spurling positive Diffuse pain in trapezius  Musculoskeletal:       Left shoulder: She exhibits tenderness.       Left elbow: tenderness found.       Left wrist: She exhibits tenderness.       Cervical back: She exhibits decreased range of motion, tenderness, bony tenderness and pain. She exhibits no spasm.       Pain diffusely in left shoulder, upper arm, forarm and wrist.  neg  tinel/phalen, neg ulnar compression but uncomfortable to palpation  Skin:       No redness or swelling or contusion at site of injection.          Assessment & Plan:

## 2011-09-26 ENCOUNTER — Telehealth: Payer: Self-pay | Admitting: *Deleted

## 2011-09-26 MED ORDER — MELOXICAM 15 MG PO TABS: 15.0000 mg | ORAL_TABLET | Freq: Every day | ORAL | Status: DC

## 2011-09-26 NOTE — Telephone Encounter (Signed)
Patient advised.

## 2011-09-26 NOTE — Telephone Encounter (Signed)
Pt seen yesterday given percocet, pt says it caused nausea all night. She request a new rx for something else that won't cause sedation that she can take during the day.

## 2011-09-26 NOTE — Telephone Encounter (Signed)
Does not tolearate tramadol, vicodin, percocet.  Diclofenac and PTC meds have not helped.  Have her stop prednisone and diclofenac and can try a trial of meloxicam for pain, a Non sedating anti-inflammatory.

## 2011-09-29 ENCOUNTER — Encounter: Payer: Self-pay | Admitting: Family Medicine

## 2011-09-29 ENCOUNTER — Ambulatory Visit (INDEPENDENT_AMBULATORY_CARE_PROVIDER_SITE_OTHER): Payer: BC Managed Care – PPO | Admitting: Family Medicine

## 2011-09-29 ENCOUNTER — Ambulatory Visit (INDEPENDENT_AMBULATORY_CARE_PROVIDER_SITE_OTHER)
Admission: RE | Admit: 2011-09-29 | Discharge: 2011-09-29 | Disposition: A | Discharge status: Home / Self Care | Payer: BC Managed Care – PPO | Source: Ambulatory Visit | Attending: Family Medicine | Admitting: Family Medicine

## 2011-09-29 DIAGNOSIS — R209 Unspecified disturbances of skin sensation: Secondary | ICD-10-CM

## 2011-09-29 DIAGNOSIS — R200 Anesthesia of skin: Secondary | ICD-10-CM

## 2011-09-29 DIAGNOSIS — M79602 Pain in left arm: Secondary | ICD-10-CM

## 2011-09-29 DIAGNOSIS — M79609 Pain in unspecified limb: Secondary | ICD-10-CM

## 2011-09-29 NOTE — Progress Notes (Signed)
Subjective:    Patient ID: Patricia Travis, female    DOB: [birth year], 60 y.o.   MRN: [id number]  HPI  Patricia Travis, a 60 y.o. female presents today in the office for the following:    F/u from seeing my partner Dr. Ermalene Searing 09/25/2011, f/u L arm pain.  09/12/2011 -- Reports having new onset left-sided radiculopathy type symptoms from the neck and shoulder blade down through the arm to her fingertips. She describes the onset of this on the date of a prior encounter on 09/12/2011. She believes that began approximately 10 minutes after getting intramuscular B12 injection. She's not having any strength deficit. She denies a prior spine surgical history. She does have a history of peripheral neuropathy and B12 deficiency. Just has a history of diabetes mellitus.  [facility].  She denies any specific trauma preceding the onset of these symptoms. She also has some carpal tunnel syndrome symptoms from the past. She does also describe shoulder blade pain, and the radicular pain symptoms do worsen with neck motion.  No improvement, she is baseline on gabapentin, and additionally she has had a steroid Dosepak. She has also taken some tramadol and some anti-inflammatories.   09/25/2011 OV 60 year old female with pain in her left arm  following B12 injection on 10/19th.  She reports it began as an constantache started 15 minutes. Felt stiff neck.   Constant pain in entire arm, pain up to neck and shoulders off and on numbness  In arm to fingertips. No weakness.   She reports pain is severe enough that she is having difficulty working with pain.  She feels the pain is getting worse.  No relief with heat, ice, OTC ibuprofen, ASA etc.   Diclofenac and tramadol has not helped.   Prednisone given emperically for possible allergic response or inflammation did not help after 2 doses.   Review of Systems REVIEW OF SYSTEMS  GEN: No fevers,  chills. Nontoxic. Primarily MSK c/o today. MSK: Detailed in the HPI GI: tolerating PO intake without difficulty Neuro: detailed above Otherwise the pertinent positives of the ROS are noted above.      Objective:   Physical Exam   Physical Exam  Blood pressure 120/70, pulse 71, temperature 98 F (36.7 C), temperature source Oral, height 5' 5.5" (1.664 m), weight 171 lb (77.565 kg).  GEN: WDWN, NAD, Non-toxic, A & O x 3 HEENT: Atraumatic, Normocephalic. Neck supple. No masses, No LAD. Ears and Nose: No external deformity. EXTR: No c/c/e NEURO Normal gait.  PSYCH: Normally interactive. Conversant. Not depressed or anxious appearing.  Calm demeanor.   Shoulder: Left nontender along clavicle. Nontender the a.c. joint. Full range of motion at the left and right shoulder. Strength testing is 5/5 throughout the upper extremities. No pain with abduction. Nontender crossover test. Negative speeds and Yergason's test. Negative Neer testing and negative Leanord Asal test. Negative Jobe test. She is moderately tender at the posterior trapezius musculature and paracervical musculature.  Nontender along the cervical spinous processes. Spurling's maneuver reproduces symptoms in both directions, meaning with rotation looking to the left and the right, the patient has reproducible radiculopathy on the left. No right-sided radiculopathy.  C5-T1 is intact from a sensory and motor function standpoint.    Assessment & Plan:   1. Left arm pain  DG Cervical Spine Complete, Ambulatory referral to Neurology, MR Cervical Spine Wo Contrast  2. Left arm numbness  DG Cervical Spine Complete, Ambulatory  referral to Neurology, MR Cervical Spine Wo Contrast    Left arm pain, radiculopathy, and subjective sensation of tingling and numbness. Unclear etiology. Clinically suspect spinal pathology. Unclear to me how this could relate to potential intramuscular deltoid injection. Consult neurology for their opinion  on this case.  Obtain cervical spine series as detailed below. Attempted to obtain cervical spine MRI without contrast. The patient relates that she has had difficulty with MRI even with oral benzos, and in the past she has required IV sedation. This falls outside of my practice pattern in the outpatient setting, so I think in this case, my best suggestion is to follow-up with Neurology for Dr. Nash Dimmer specific recommendations for evaluation of her symptoms. I appreciate his input and assistance.   CERVICAL SPINE - COMPLETE 4+ VIEW  Comparison: None  Findings: Normal alignment of the lumbar spine.  The vertebral body heights are well preserved.  Mild multilevel ventral endplate spurring is noted at the C4-C5 and C6-7 level. There is a left-sided C6-7 and C7-T1 neural foraminal stenoses.  The right neuroforamina appear patent.  IMPRESSION:  1. No acute findings. 2. Left-sided neural foraminal stenoses.  Original Report Authenticated By: Rosealee Albee, M.D.

## 2011-09-29 NOTE — Patient Instructions (Signed)
REFERRAL: GO THE THE FRONT ROOM AT THE ENTRANCE OF OUR CLINIC, NEAR CHECK IN. ASK FOR MARION. SHE WILL HELP YOU SET UP YOUR REFERRAL. DATE: TIME:  

## 2011-10-02 ENCOUNTER — Encounter: Payer: Self-pay | Admitting: Neurology

## 2011-10-02 ENCOUNTER — Ambulatory Visit (INDEPENDENT_AMBULATORY_CARE_PROVIDER_SITE_OTHER): Payer: BC Managed Care – PPO | Admitting: Neurology

## 2011-10-02 VITALS — BP 140/72 | HR 72 | Ht 65.5 in | Wt 170.0 lb

## 2011-10-02 DIAGNOSIS — M79602 Pain in left arm: Secondary | ICD-10-CM

## 2011-10-02 DIAGNOSIS — M79609 Pain in unspecified limb: Secondary | ICD-10-CM

## 2011-10-02 MED ORDER — GABAPENTIN 600 MG PO TABS: 600.0000 mg | ORAL_TABLET | Freq: Every day | ORAL | Status: DC

## 2011-10-02 NOTE — Progress Notes (Signed)
Dear Dr. Patsy Lager and Dr. Ermalene Searing,  Thank you for having me see Patricia Travis in consultation today at [facility] for her problem with left arm pain.  As you may recall, she is a 60 y.o. year old female with a history of diabetes and bilateral carpal tunnel syndrome who presents with left arm pain after an IM B12 injection.  The pain is described as being worse in the posterior lateral aspect of the left arm.  It is unlike her carpal tunnel syndrome pain.  It does seem to be worse with neck movement, but neck pain is not a significant component.  She denies weakness.  Past Medical History  Diagnosis Date  . GERD (gastroesophageal reflux disease)   . Hyperlipidemia   . Hypertension   . Osteoarthritis   . Osteopenia     Past Surgical History  Procedure Date  . Knee arthroscopy 1990    L  . Knee cartilage surgery 1992    L  . Total knee arthroplasty 7/08    L  . Abdominal hysterectomy     total    History   Social History  . Marital Status: Single    Spouse Name: N/A    Number of Children: N/A  . Years of Education: N/A   Occupational History  . Finance [employer]   Social History Main Topics  . Smoking status: Former Smoker -- 25 years    Quit date: 09/07/1992  . Smokeless tobacco: Never Used  . Alcohol Use: None  . Drug Use: None  . Sexually Active: None   Other Topics Concern  . None   Social History Narrative  . None    Family History  Problem Relation Age of Onset  . Heart attack Father   . Coronary artery disease Father   . Arthritis Mother     RA, and osteoarthritis  . Diabetes Sister   . Diabetes      grandparents  . Breast cancer Maternal Grandmother       ROS:  13 systems were reviewed and are notable for chronic migraine headaches, she also gets numbness and tingling of her feet.  All other review of systems are unremarkable.   Examination:  Filed Vitals:   10/02/11 0802  BP: 140/72  Pulse: 72  Height: 5'  5.5" (1.664 m)  Weight: 170 lb (77.111 kg)     In general, well appearing woman.  Cardiovascular: The patient has a regular rate and rhythm and no carotid bruits.  Fundoscopy:  Disks are flat. Vessel caliber within normal limits.  No changes due to diabetes.  Mental status:   The patient is oriented to person, place and time. Recent and remote memory are intact. Attention span and concentration are normal. Language including repetition, naming, following commands are intact. Fund of knowledge of current and historical events, as well as vocabulary are normal.  Cranial Nerves: Pupils are equally round and reactive to light. Visual fields full to confrontation. Extraocular movements are intact without nystagmus. Facial sensation and muscles of mastication are intact. Muscles of facial expression are symmetric. Hearing intact to bilateral finger rub. Tongue protrusion, uvula, palate midline.  Shoulder shrug intact  Motor:  The patient has decreased deltoid bulk on left, no pronator drift.  There are no adventitious movements.  5/5 muscle strength everywhere but ? 4++ in left shoulder abduction.  Reflexes:   Biceps  Triceps Brachioradialis Knee Ankle  Right 2+  2+  2+  2+ 0  Left  2+  2+  2+   2+ 0  Toes down  Coordination:  Normal finger to nose.    Sensation is decreased in the feet to temperature and vibration, could not get a reproducible sensory deficit in her left upper arm.  Gait and Station are normal.  Tandem gait is intact.  Romberg is negative   Impression: Likely axillary nerve trauma from a IM B12 injection.  Cannot exclude a radiculopathy, particularly C5-C6.   Recommendations: I am going to try to get an EMG/NCS of her left upper extremity to try to confirm the axillary nerve trauma.  These usually improve on their own, but pain control is going to be the key here.  I have given her a schedule for increasing her gabapentin to 1200 tid to see if this helps.  She may  need a long acting narcotic to control the pain if this or pregabalin is not successful.  We will see the patient back in two weeks.  Thank you for having [place] see Patricia Travis in consultation.  Feel free to contact me with any questions.  [name], MD [facility], [place] [address] [place], [place] [address] Phone: [phone] Fax: [phone].

## 2011-10-02 NOTE — Patient Instructions (Addendum)
Your appointment for the nerve conduction studies/electromyelogram are scheduled for Monday, November 19th at 10:45am at  [facility] [address] [place] [phone].   Gabapentin 600 mg tabs  Take 1 tab twice a day starting today, then in 5 days increase to 1 tab three times a day, then in 5 days increase to 1 tab in the am, 1 tab at midday, 2 tabs at night then in 5 days increase to 2 tabs in the am, 1 tab at midday, 2 tabs at night, then in 5 days increase to 2 tabs three times a day from then on.

## 2011-10-03 ENCOUNTER — Ambulatory Visit: Payer: BC Managed Care – PPO | Admitting: Neurology

## 2011-10-03 ENCOUNTER — Telehealth: Payer: Self-pay | Admitting: Family Medicine

## 2011-10-03 NOTE — Telephone Encounter (Signed)
Called patient and left voice mail explaining that I would like to discuss and asked her to call back.

## 2011-10-03 NOTE — Telephone Encounter (Signed)
Spoke with Ms Cannady in detail. Discussed Dr. Modesto Charon' s Visit. Reviewed recent plain X-ray results. Offered other narcotic pain med such as dilaudid or referral to pain center, but she is not interested in it at this time given most pain is during the day and she cannot afford to be sedated at work etc. She is also not interested in trying an additional medication because it may not help or may cause SE.  Discussed upcoming EMG and how this will help differentiate between peripheral and central nerve irritation. If this is normal.. Dr. Modesto Charon may proceed to MRI cervical spine to assess for central cause of symptoms.  Pt had questions about prognosis and length of time of symptoms, which if it is from axillary nerve damage.. I felt I could not answer accurately and I recommended she discuss this with Dr. Modesto Charon at follow up.  She will continue titrating up neurontin for now as directed.  Routed to Dr. Modesto Charon as Lorain Childes.

## 2011-10-06 ENCOUNTER — Telehealth: Payer: Self-pay | Admitting: Family Medicine

## 2011-10-06 NOTE — Telephone Encounter (Signed)
Patient returned call this morning.  I explained to patient that I was calling as follow up from her concerns on 10/30 about Prednisone being sent to [facility] instead of local pharmacy and I was unaware of her more recent concerns that Dr. Ermalene Searing was following up on.  Patient described recent concerns after a B12 shot and I explained that it appeared physicians were following up on her concerns and she verbalized that Dr. Ermalene Searing had been in contact with her and provided information.  Patient was appreciative of the follow up and had concerns about recovery time and her ability to work with symptoms and inability to take meds and work.  I explained that the doctors would be able to better answer her questions and that I would notify Dr. Ermalene Searing that we had talked.  Patient stated that she had also had a difficult time with a blood draw a few months back and wanted to make me aware.  Patient stated she was "not happy" and currently experiencing a lot of pain due to her primary concern with the most recent visit and symptoms after B12 shot.  I reassured patient that the doctors are working together on her concerns and will continue to communicate with her and I provided my contact name and number to call if she had further questions or I can assist in any way.

## 2011-10-15 ENCOUNTER — Encounter: Payer: Self-pay | Admitting: Neurology

## 2011-10-15 ENCOUNTER — Ambulatory Visit (INDEPENDENT_AMBULATORY_CARE_PROVIDER_SITE_OTHER): Payer: BC Managed Care – PPO | Admitting: Neurology

## 2011-10-15 VITALS — BP 106/62 | HR 72 | Ht 65.5 in | Wt 171.0 lb

## 2011-10-15 DIAGNOSIS — S4430XA Injury of axillary nerve, unspecified arm, initial encounter: Secondary | ICD-10-CM

## 2011-10-15 DIAGNOSIS — I10 Essential (primary) hypertension: Secondary | ICD-10-CM

## 2011-10-15 MED ORDER — GABAPENTIN 600 MG PO TABS: ORAL_TABLET | ORAL | Status: DC

## 2011-10-15 NOTE — Progress Notes (Signed)
Dear Dr. Ermalene Searing,  I saw  Patricia Travis back in [facility] clinic for her problem with left arm pain.  As you may recall, she is a 60 y.o. year old female with a history of diabetes and carpal tunnel syndrome who got a B12 injection of the left arm and had severe shoulder and arm pain after that.    When I saw her I felt that she had injured her axillary nerve secondary to the IM injection.  I placed her on an escalating dose of Neurontin, but she could not tolerate 600mg  tid, so ended up taking it 600/1200. However, this still does not help the arm pain.  We attempted to get an NCS/EMG, but she refuse the EMG portion.  We were able to confirm that she did not have an ulnar or median neuropathy on the left.  She returns to today she still have severe pain of the left arm.  She says that Darvocet was the only thing that helped it which she tried from a left over supply she had.  Unfortunately, it has been taken off the market.  Percocet makes her too tried as did the Neurontin.  She has used Lyrica for headaches, but does not feel this will help her.  Medical history, social history, and family history were reviewed and have not changed since the last clinic visit. Current Outpatient Prescriptions on File Prior to Visit  Medication Sig Dispense Refill  . amLODipine (NORVASC) 5 MG tablet TAKE ONE TABLET BY MOUTH ONE TIME DAILY  30 tablet  2  . losartan (COZAAR) 25 MG tablet 1/2 tablet by mouth daily       . metFORMIN (GLUCOPHAGE) 500 MG tablet 2 tablets by mouth 2 times a day.       . simvastatin (ZOCOR) 40 MG tablet TAKE 1 TABLET DAILY  90 tablet  1  . SPIRIVA HANDIHALER 18 MCG inhalation capsule INHALE ONE CAPSULE BY MOUTH ONE TIME DAILY  30 each  6  . SUMAtriptan (IMITREX) 50 MG tablet Take 50 mg by mouth. As needed migraines       . traMADol (ULTRAM) 50 MG tablet TAKE 1 TABLET EVERY 6 HOURS AS NEEDED FOR PAIN  90 tablet  1  gabapentin 600/1200   Allergies  Allergen Reactions    . Ace Inhibitors     REACTION: cough  . Cortisone Acetate     REACTION: Makes pain worse  . Penicillins     REACTION: throat swelling  . Tetracyclines & Related Swelling    throat    ROS:  13 systems were reviewed and are notable for migraine headaches.  All other review of systems are unremarkable.  Exam: . Filed Vitals:   10/15/11 1059  BP: 106/62  Pulse: 72  Height: 5' 5.5" (1.664 m)  Weight: 171 lb (77.565 kg)    In general, well appearing woman.  Mental status:   The patient is oriented to person, place and time. Recent and remote memory are intact. Attention span and concentration are normal. Language including repetition, naming, following commands are intact. Fund of knowledge of current and historical events, as well as vocabulary are normal.    Motor:  Mild decreased bulk, left deltoid.  Mild shoulder abduction weakness on the left.  All other muscles in arm 5/5.  Reflexes:  2+ thoughout  Sensation:  patch of decreased sensation over the left shoulder.  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   I  spoke at length about the potential management of her injury.  I think it is likely that it will improve, but of course there is the chance that it doesn't.  I mentioned trying other neuropathic medications, but because of potential fatigue she seems unwilling to do this.  However, I did mention that I thought a stellate ganglion block may be an option and confirmed this with Dr. Ollen Bowl nurse at [facility].  I will make a referral there.  If this does not work, then I would suggest a referral to chronic pain/palliative care for medication management as they may know another medication similar to Darvocet that may benefit her.  She can follow up PRN with me.  Let me know if I can be of further assistance.  Patricia Raider Modesto Charon, MD [facility], [place]

## 2011-10-20 ENCOUNTER — Encounter: Payer: Self-pay | Admitting: Neurology

## 2011-10-21 ENCOUNTER — Telehealth: Payer: Self-pay

## 2011-10-21 NOTE — Telephone Encounter (Signed)
Notified pt of appt at [facility] on 10/30/11 at 2:15.

## 2011-11-06 ENCOUNTER — Other Ambulatory Visit: Payer: Self-pay | Admitting: Family Medicine

## 2011-12-10 ENCOUNTER — Other Ambulatory Visit: Payer: BC Managed Care – PPO

## 2011-12-15 ENCOUNTER — Ambulatory Visit: Payer: BC Managed Care – PPO | Admitting: Family Medicine

## 2012-01-29 ENCOUNTER — Other Ambulatory Visit: Payer: Self-pay | Admitting: Family Medicine

## 2012-02-17 ENCOUNTER — Ambulatory Visit: Payer: Self-pay

## 2012-04-28 ENCOUNTER — Other Ambulatory Visit: Payer: Self-pay | Admitting: Family Medicine

## 2012-07-27 ENCOUNTER — Inpatient Hospital Stay: Payer: Self-pay | Admitting: Surgery

## 2012-07-27 LAB — COMPREHENSIVE METABOLIC PANEL
Anion Gap: 8 (ref 7–16)
BUN: 8 mg/dL (ref 7–18)
Chloride: 103 mmol/L (ref 98–107)
EGFR (African American): >60
Potassium: 4 mmol/L (ref 3.5–5.1)
SGOT(AST): 233 U/L — ABNORMAL HIGH (ref 15–37)
Total Protein: 7.8 g/dL (ref 6.4–8.2)

## 2012-07-27 LAB — CBC
HCT: 39.9 % (ref 35.0–47.0)
MCH: 29.6 pg (ref 26.0–34.0)
MCHC: 33.4 g/dL (ref 32.0–36.0)
MCV: 89 fL (ref 80–100)
RBC: 4.5 10*6/uL (ref 3.80–5.20)
WBC: 15.5 10*3/uL — ABNORMAL HIGH (ref 3.6–11.0)

## 2012-07-27 LAB — TROPONIN I: Troponin-I: <0.02 ng/mL

## 2012-07-27 LAB — PROTIME-INR
INR: 0.9
Prothrombin Time: 12.4 secs (ref 11.5–14.7)

## 2012-07-27 LAB — APTT: Activated PTT: 27.1 secs (ref 23.6–35.9)

## 2012-07-28 LAB — CBC WITH DIFFERENTIAL/PLATELET
Basophil %: 0.3 %
Eosinophil #: 0 10*3/uL (ref 0.0–0.7)
HCT: 34.3 % — ABNORMAL LOW (ref 35.0–47.0)
Lymphocyte #: 0.7 10*3/uL — ABNORMAL LOW (ref 1.0–3.6)
Lymphocyte %: 5.7 %
MCHC: 33.8 g/dL (ref 32.0–36.0)
Monocyte #: 0.5 x10 3/mm (ref 0.2–0.9)
Neutrophil #: 11 10*3/uL — ABNORMAL HIGH (ref 1.4–6.5)
RDW: 13.2 % (ref 11.5–14.5)

## 2012-07-28 LAB — BASIC METABOLIC PANEL
BUN: 8 mg/dL (ref 7–18)
Chloride: 104 mmol/L (ref 98–107)
Osmolality: 278 (ref 275–301)
Potassium: 3.6 mmol/L (ref 3.5–5.1)
Sodium: 138 mmol/L (ref 136–145)

## 2012-07-28 LAB — HEPATIC FUNCTION PANEL A (ARMC)
SGOT(AST): 485 U/L — ABNORMAL HIGH (ref 15–37)
SGPT (ALT): 368 U/L — ABNORMAL HIGH (ref 12–78)
Total Protein: 6.6 g/dL (ref 6.4–8.2)

## 2012-07-29 LAB — COMPREHENSIVE METABOLIC PANEL
Alkaline Phosphatase: 197 U/L — ABNORMAL HIGH (ref 50–136)
Bilirubin,Total: 3.6 mg/dL — ABNORMAL HIGH (ref 0.2–1.0)
Co2: 28 mmol/L (ref 21–32)
Creatinine: 0.73 mg/dL (ref 0.60–1.30)
EGFR (Non-African Amer.): >60
SGPT (ALT): 243 U/L — ABNORMAL HIGH (ref 12–78)

## 2012-07-29 LAB — CBC WITH DIFFERENTIAL/PLATELET
Eosinophil %: 2.9 %
MCH: 30.1 pg (ref 26.0–34.0)
MCV: 88 fL (ref 80–100)
Monocyte %: 7 %
Neutrophil %: 78.9 %
Platelet: 183 10*3/uL (ref 150–440)
RDW: 13.4 % (ref 11.5–14.5)

## 2012-07-29 LAB — IRON AND TIBC
Iron Bind.Cap.(Total): 307 ug/dL (ref 250–450)
Iron Saturation: 20 %
Iron: 61 ug/dL (ref 50–170)

## 2012-07-30 LAB — COMPREHENSIVE METABOLIC PANEL
BUN: 7 mg/dL (ref 7–18)
Bilirubin,Total: 3.6 mg/dL — ABNORMAL HIGH (ref 0.2–1.0)
Chloride: 108 mmol/L — ABNORMAL HIGH (ref 98–107)
Creatinine: 0.63 mg/dL (ref 0.60–1.30)
EGFR (African American): >60
EGFR (Non-African Amer.): >60
Glucose: 134 mg/dL — ABNORMAL HIGH (ref 65–99)
Potassium: 4.1 mmol/L (ref 3.5–5.1)
SGOT(AST): 59 U/L — ABNORMAL HIGH (ref 15–37)
SGPT (ALT): 167 U/L — ABNORMAL HIGH (ref 12–78)
Sodium: 140 mmol/L (ref 136–145)

## 2012-08-04 ENCOUNTER — Encounter (INDEPENDENT_AMBULATORY_CARE_PROVIDER_SITE_OTHER): Payer: Self-pay | Admitting: Surgery

## 2012-08-05 ENCOUNTER — Ambulatory Visit (INDEPENDENT_AMBULATORY_CARE_PROVIDER_SITE_OTHER): Payer: BC Managed Care – PPO | Admitting: Surgery

## 2012-08-05 ENCOUNTER — Encounter (INDEPENDENT_AMBULATORY_CARE_PROVIDER_SITE_OTHER): Payer: Self-pay | Admitting: Surgery

## 2012-08-05 VITALS — BP 130/84 | HR 71 | Temp 98.4°F | Ht 65.5 in | Wt 173.8 lb

## 2012-08-05 DIAGNOSIS — K811 Chronic cholecystitis: Secondary | ICD-10-CM

## 2012-08-05 NOTE — Progress Notes (Signed)
Patient ID: Patricia Travis, female   DOB: 05/17/1951, 61 y.o.   MRN: [id number]  Chief Complaint  Patient presents with  . Pre-op Exam    eval gallbladder    HPI Patricia Travis is a 61 y.o. female.  Referred by Dr. Amy Bedsole for evaluation of gallbladder symptoms HPI This patient presents after a recent hospitalization at [facility]. She had a very interesting hospital course. On September 1 the patient had a meal at [employer]. Later that night she awoke with severe right upper quadrant abdominal pain and nausea. She also felt very bloated. The pain radiated around her upper abdomen in a bandlike fashion all the way to her back.She went to the emergency department on 07/27/12 and underwent a workup during that admission. Her white count was elevated at 15.2. Bilirubin on admission was 0.8 alkaline phosphatase 151, SGPT 118 SGOT 233. Ultrasound showed no sign of stones, no wall thickening, no pericholecystic fluid, normal-appearing common bile duct. A CT scan showed mild gallbladder wall thickening and pericholecystic fluid. A hiatus scan was then performed which was grossly abnormal. There is no sign of filling in either the gallbladder or in the extrahepatic bile ducts. She was seen by a gastroenterologist who performed an ERCP which showed normal bile ducts with no sign of stone or obstruction with filling of the gallbladder. A Sphincterotomy was performed at the time of ERCP. Her bilirubin during hospitalization did increase to a high of 3.6. AST 117 ALT 243. Her white count remained normal through the remainder of her hospitalization.  Currently her main complaint is the constant nausea. She has a feeling of discomfort in her epigastrium and her right upper quadrant but the pain is slightly improved. She has been avoiding any fat in her diet. Her hepatitis serologies have come back negative. She presents now to discuss possible surgical removal of her gallbladder.  Past Medical  History  Diagnosis Date  . GERD (gastroesophageal reflux disease)   . Hyperlipidemia   . Hypertension   . Osteoarthritis   . Osteopenia   . Asthma   . Diabetes mellitus     Past Surgical History  Procedure Date  . Knee arthroscopy 1990    L  . Knee cartilage surgery 1992    L  . Total knee arthroplasty 7/08    L  . Abdominal hysterectomy     total    Family History  Problem Relation Age of Onset  . Heart attack Father   . Coronary artery disease Father   . Heart disease Father   . Arthritis Mother     RA, and osteoarthritis  . Diabetes Sister   . Diabetes      grandparents  . Breast cancer Maternal Grandmother   . Cancer Maternal Grandmother     breast  . Cancer Maternal Aunt     breast  . Cancer Paternal Grandfather     Social History History  Substance Use Topics  . Smoking status: Former Smoker -- 25 years    Quit date: 09/07/1992  . Smokeless tobacco: Never Used  . Alcohol Use: No    Allergies  Allergen Reactions  . Ace Inhibitors     REACTION: cough  . Cortisone Acetate     REACTION: Makes pain worse  . Penicillins     REACTION: throat swelling  . Tetracyclines & Related Swelling    throat    Current Outpatient Prescriptions  Medication Sig Dispense Refill  . amLODipine (NORVASC) 5 MG   tablet TAKE ONE TABLET BY MOUTH ONE TIME DAILY  30 tablet  2  . gabapentin (NEURONTIN) 600 MG tablet take 1 in the am, 2 at night.  90 tablet  0  . losartan (COZAAR) 25 MG tablet 1/2 tablet by mouth daily       . metFORMIN (GLUCOPHAGE) 500 MG tablet TAKE TWO TABLETS BY MOUTH TWICE DAILY  120 tablet  9  . simvastatin (ZOCOR) 40 MG tablet TAKE 1 TABLET DAILY  90 tablet  2  . SPIRIVA HANDIHALER 18 MCG inhalation capsule INHALE ONE CAPSULE BY MOUTH ONE TIME DAILY  30 each  6  . SUMAtriptan (IMITREX) 50 MG tablet Take 50 mg by mouth. As needed migraines       . traMADol (ULTRAM) 50 MG tablet TAKE 1 TABLET EVERY 6 HOURS AS NEEDED FOR PAIN  90 tablet  1  .  HYDROcodone-acetaminophen (NORCO/VICODIN) 5-325 MG per tablet       . prochlorperazine (COMPAZINE) 10 MG tablet         Review of Systems Review of Systems  Constitutional: Negative for fever, chills and unexpected weight change.  HENT: Negative for hearing loss, congestion, sore throat, trouble swallowing and voice change.   Eyes: Negative for visual disturbance.  Respiratory: Negative for cough and wheezing.   Cardiovascular: Negative for chest pain, palpitations and leg swelling.  Gastrointestinal: Positive for nausea, vomiting, diarrhea and abdominal distention. Negative for abdominal pain, constipation, blood in stool and anal bleeding.  Genitourinary: Negative for hematuria, vaginal bleeding and difficulty urinating.  Musculoskeletal: Negative for arthralgias.  Skin: Negative for rash and wound.  Neurological: Negative for seizures, syncope and headaches.  Hematological: Negative for adenopathy. Does not bruise/bleed easily.  Psychiatric/Behavioral: Negative for confusion.    Blood pressure 130/84, pulse 71, temperature 98.4 F (36.9 C), temperature source Temporal, height 5' 5.5" (1.664 m), weight 173 lb 12.8 oz (78.835 kg), SpO2 97.00%.  Physical Exam Physical Exam WDWN in NAD HEENT:  EOMI, sclera anicteric Neck:  No masses, no thyromegaly Lungs:  CTA bilaterally; normal respiratory effort CV:  Regular rate and rhythm; no murmurs Abd:  +bowel sounds, soft, tender in RUQ and epigastrium Ext:  Well-perfused; no edema Skin:  Warm, dry; no sign of jaundice  Data Reviewed Hospital records from [facility]  Assessment    Chronic cholecystitis, likely with choledocholithiasis and spontaneous passage of the common bile duct stone prior to ERCP. Serologies have ruled out hepatitis.    Plan    Laparoscopic cholecystectomy with intraoperative cholangiogram.  The surgical procedure has been discussed with the patient.  Potential risks, benefits, alternative treatments,  and expected outcomes have been explained.  All of the patient's questions at this time have been answered.  The likelihood of reaching the patient's treatment goal is good.  The patient understand the proposed surgical procedure and wishes to proceed.        Graciemae Delisle K. 08/05/2012, 5:26 PM    

## 2012-08-06 ENCOUNTER — Encounter (HOSPITAL_COMMUNITY): Payer: Self-pay | Admitting: Pharmacy Technician

## 2012-08-11 ENCOUNTER — Encounter (INDEPENDENT_AMBULATORY_CARE_PROVIDER_SITE_OTHER): Payer: Self-pay

## 2012-08-11 ENCOUNTER — Encounter (HOSPITAL_COMMUNITY): Payer: Self-pay

## 2012-08-11 ENCOUNTER — Ambulatory Visit (HOSPITAL_COMMUNITY)
Admission: RE | Admit: 2012-08-11 | Discharge: 2012-08-11 | Disposition: A | Discharge status: Home / Self Care | Payer: BC Managed Care – PPO | Source: Ambulatory Visit | Attending: Surgery | Admitting: Surgery

## 2012-08-11 ENCOUNTER — Encounter (HOSPITAL_COMMUNITY)
Admission: RE | Admit: 2012-08-11 | Discharge: 2012-08-11 | Disposition: A | Discharge status: Home / Self Care | Payer: BC Managed Care – PPO | Source: Ambulatory Visit | Attending: Surgery | Admitting: Surgery

## 2012-08-11 DIAGNOSIS — Z01818 Encounter for other preprocedural examination: Secondary | ICD-10-CM | POA: Insufficient documentation

## 2012-08-11 DIAGNOSIS — Z01810 Encounter for preprocedural cardiovascular examination: Secondary | ICD-10-CM | POA: Insufficient documentation

## 2012-08-11 DIAGNOSIS — Z01812 Encounter for preprocedural laboratory examination: Secondary | ICD-10-CM | POA: Insufficient documentation

## 2012-08-11 HISTORY — DX: Chronic obstructive pulmonary disease, unspecified: J44.9

## 2012-08-11 HISTORY — DX: Anemia, unspecified: D64.9

## 2012-08-11 HISTORY — DX: Other complications of anesthesia, initial encounter: T88.59XA

## 2012-08-11 HISTORY — DX: Adverse effect of unspecified anesthetic, initial encounter: T41.45XA

## 2012-08-11 HISTORY — DX: Headache: R51

## 2012-08-11 LAB — SURGICAL PCR SCREEN
MRSA, PCR: NEGATIVE
Staphylococcus aureus: POSITIVE — AB

## 2012-08-11 LAB — BASIC METABOLIC PANEL
BUN: 6 mg/dL (ref 6–23)
Calcium: 9.2 mg/dL (ref 8.4–10.5)
Creatinine, Ser: 0.62 mg/dL (ref 0.50–1.10)
GFR calc Af Amer: >90 mL/min (ref >90)

## 2012-08-11 LAB — CBC
MCHC: 32 g/dL (ref 30.0–36.0)
MCV: 90.7 fL (ref 78.0–100.0)
Platelets: 302 10*3/uL (ref 150–400)
RDW: 12.9 % (ref 11.5–15.5)
WBC: 11.2 10*3/uL — ABNORMAL HIGH (ref 4.0–10.5)

## 2012-08-11 NOTE — Pre-Procedure Instructions (Addendum)
20 Patricia Travis  08/11/2012   Your procedure is scheduled on:  08/16/12  Report to [facility] at 700 AM.  Call this number if you have problems the morning of surgery: [phone]   Remember:   Do not eat food or drink:After Midnight.    Take these medicines the morning of surgery with A SIP OF WATER: amlodipine, , inhaler,pain med STOP any aspirin, nsaids, herbal meds, blood thinners   Do not wear jewelry, make-up or nail polish.  Do not wear lotions, powders, or perfumes. You may wear deodorant.  Do not shave 48 hours prior to surgery. Men may shave face and neck.  Do not bring valuables to the hospital.  Contacts, dentures or bridgework may not be worn into surgery.  Leave suitcase in the car. After surgery it may be brought to your room.  For patients admitted to the hospital, checkout time is 11:00 AM the day of discharge.   Patients discharged the day of surgery will not be allowed to drive home.  Name and phone number of your driver: [phone] jerry flowers partner  Special Instructions: CHG Shower Use Special Wash: 1/2 bottle night before surgery and 1/2 bottle morning of surgery.   Please read over the following fact sheets that you were given: Pain Booklet, Coughing and Deep Breathing, MRSA Information and Surgical Site Infection Prevention

## 2012-08-15 MED ORDER — CHLORHEXIDINE GLUCONATE 4 % EX LIQD: 1.0000 "application " | Freq: Once | CUTANEOUS | Status: DC

## 2012-08-15 MED ORDER — CIPROFLOXACIN IN D5W 400 MG/200ML IV SOLN
400.0000 mg | INTRAVENOUS | Status: DC
  Filled 2012-08-15: qty 200

## 2012-08-16 ENCOUNTER — Ambulatory Visit (HOSPITAL_COMMUNITY): Payer: BC Managed Care – PPO | Admitting: Anesthesiology

## 2012-08-16 ENCOUNTER — Encounter (HOSPITAL_COMMUNITY): Payer: Self-pay | Admitting: Anesthesiology

## 2012-08-16 ENCOUNTER — Encounter (HOSPITAL_COMMUNITY): Payer: Self-pay | Admitting: *Deleted

## 2012-08-16 ENCOUNTER — Ambulatory Visit (HOSPITAL_COMMUNITY): Payer: BC Managed Care – PPO

## 2012-08-16 ENCOUNTER — Ambulatory Visit (HOSPITAL_COMMUNITY)
Admission: RE | Admit: 2012-08-16 | Discharge: 2012-08-16 | Disposition: A | Discharge status: Home / Self Care | Payer: BC Managed Care – PPO | Source: Ambulatory Visit | Attending: Surgery | Admitting: Surgery

## 2012-08-16 ENCOUNTER — Encounter (HOSPITAL_COMMUNITY)
Admission: RE | Disposition: A | Discharge status: Home / Self Care | Payer: Self-pay | Source: Ambulatory Visit | Attending: Surgery

## 2012-08-16 DIAGNOSIS — K219 Gastro-esophageal reflux disease without esophagitis: Secondary | ICD-10-CM | POA: Insufficient documentation

## 2012-08-16 DIAGNOSIS — E119 Type 2 diabetes mellitus without complications: Secondary | ICD-10-CM | POA: Insufficient documentation

## 2012-08-16 DIAGNOSIS — K8066 Calculus of gallbladder and bile duct with acute and chronic cholecystitis without obstruction: Secondary | ICD-10-CM

## 2012-08-16 DIAGNOSIS — K811 Chronic cholecystitis: Secondary | ICD-10-CM

## 2012-08-16 DIAGNOSIS — I10 Essential (primary) hypertension: Secondary | ICD-10-CM | POA: Insufficient documentation

## 2012-08-16 DIAGNOSIS — K8010 Calculus of gallbladder with chronic cholecystitis without obstruction: Secondary | ICD-10-CM | POA: Insufficient documentation

## 2012-08-16 HISTORY — PX: CHOLECYSTECTOMY: SHX55

## 2012-08-16 LAB — GLUCOSE, CAPILLARY: Glucose-Capillary: 168 mg/dL — ABNORMAL HIGH (ref 70–99)

## 2012-08-16 SURGERY — LAPAROSCOPIC CHOLECYSTECTOMY WITH INTRAOPERATIVE CHOLANGIOGRAM
Anesthesia: General | Wound class: Clean Contaminated

## 2012-08-16 MED ORDER — SODIUM CHLORIDE 0.9 % IR SOLN
Status: DC | PRN
  Administered 2012-08-16: 1000 mL

## 2012-08-16 MED ORDER — LIDOCAINE HCL (CARDIAC) 20 MG/ML IV SOLN
INTRAVENOUS | Status: DC | PRN
  Administered 2012-08-16: 60 mg via INTRAVENOUS

## 2012-08-16 MED ORDER — HYDROMORPHONE HCL PF 1 MG/ML IJ SOLN
INTRAMUSCULAR | Status: AC
  Filled 2012-08-16: qty 1

## 2012-08-16 MED ORDER — SODIUM CHLORIDE 0.9 % IV SOLN
INTRAVENOUS | Status: DC | PRN
  Administered 2012-08-16: 10:00:00

## 2012-08-16 MED ORDER — OXYCODONE HCL 5 MG PO TABS: 5.0000 mg | ORAL_TABLET | Freq: Once | ORAL | Status: AC | PRN

## 2012-08-16 MED ORDER — FENTANYL CITRATE 0.05 MG/ML IJ SOLN
INTRAMUSCULAR | Status: DC | PRN
  Administered 2012-08-16: 150 ug via INTRAVENOUS

## 2012-08-16 MED ORDER — 0.9 % SODIUM CHLORIDE (POUR BTL) OPTIME
TOPICAL | Status: DC | PRN
  Administered 2012-08-16: 1000 mL

## 2012-08-16 MED ORDER — OXYCODONE-ACETAMINOPHEN 5-325 MG PO TABS: 1.0000 | ORAL_TABLET | ORAL | Status: DC | PRN

## 2012-08-16 MED ORDER — MORPHINE SULFATE 2 MG/ML IJ SOLN: 2.0000 mg | INTRAMUSCULAR | Status: DC | PRN

## 2012-08-16 MED ORDER — PROPOFOL 10 MG/ML IV BOLUS
INTRAVENOUS | Status: DC | PRN
  Administered 2012-08-16: 200 mg via INTRAVENOUS

## 2012-08-16 MED ORDER — OXYCODONE HCL 5 MG/5ML PO SOLN: 5.0000 mg | Freq: Once | ORAL | Status: AC | PRN

## 2012-08-16 MED ORDER — GLYCOPYRROLATE 0.2 MG/ML IJ SOLN
INTRAMUSCULAR | Status: DC | PRN
  Administered 2012-08-16: 0.4 mg via INTRAVENOUS

## 2012-08-16 MED ORDER — ONDANSETRON HCL 4 MG/2ML IJ SOLN
4.0000 mg | INTRAMUSCULAR | Status: DC | PRN
  Administered 2012-08-16: 4 mg via INTRAVENOUS

## 2012-08-16 MED ORDER — NEOSTIGMINE METHYLSULFATE 1 MG/ML IJ SOLN
INTRAMUSCULAR | Status: DC | PRN
  Administered 2012-08-16: 3 mg via INTRAVENOUS

## 2012-08-16 MED ORDER — BUPIVACAINE-EPINEPHRINE PF 0.25-1:200000 % IJ SOLN
INTRAMUSCULAR | Status: AC
  Filled 2012-08-16: qty 30

## 2012-08-16 MED ORDER — HYDROMORPHONE HCL PF 1 MG/ML IJ SOLN
0.2500 mg | INTRAMUSCULAR | Status: DC | PRN
  Administered 2012-08-16 (×2): 0.5 mg via INTRAVENOUS

## 2012-08-16 MED ORDER — ROCURONIUM BROMIDE 100 MG/10ML IV SOLN
INTRAVENOUS | Status: DC | PRN
  Administered 2012-08-16: 50 mg via INTRAVENOUS

## 2012-08-16 MED ORDER — PHENYLEPHRINE HCL 10 MG/ML IJ SOLN
INTRAMUSCULAR | Status: DC | PRN
  Administered 2012-08-16 (×2): 80 ug via INTRAVENOUS
  Administered 2012-08-16: 160 ug via INTRAVENOUS

## 2012-08-16 MED ORDER — BUPIVACAINE-EPINEPHRINE 0.25% -1:200000 IJ SOLN
INTRAMUSCULAR | Status: DC | PRN
  Administered 2012-08-16: 13 mL

## 2012-08-16 MED ORDER — ONDANSETRON HCL 4 MG/2ML IJ SOLN: 4.0000 mg | Freq: Once | INTRAMUSCULAR | Status: AC | PRN

## 2012-08-16 MED ORDER — LACTATED RINGERS IV SOLN
INTRAVENOUS | Status: DC | PRN
  Administered 2012-08-16 (×2): via INTRAVENOUS

## 2012-08-16 MED ORDER — ONDANSETRON HCL 4 MG/2ML IJ SOLN
INTRAMUSCULAR | Status: DC | PRN
  Administered 2012-08-16: 4 mg via INTRAVENOUS

## 2012-08-16 MED ORDER — ONDANSETRON HCL 4 MG/2ML IJ SOLN
INTRAMUSCULAR | Status: AC
  Filled 2012-08-16: qty 2

## 2012-08-16 SURGICAL SUPPLY — 49 items
APL SKNCLS STERI-STRIP NONHPOA (GAUZE/BANDAGES/DRESSINGS) ×1
APPLIER CLIP ROT 10 11.4 M/L (STAPLE) ×2
APR CLP MED LRG 11.4X10 (STAPLE) ×1
BAG SPEC RTRVL LRG 6X4 10 (ENDOMECHANICALS) ×1
BENZOIN TINCTURE PRP APPL 2/3 (GAUZE/BANDAGES/DRESSINGS) ×2 IMPLANT
BLADE SURG ROTATE 9660 (MISCELLANEOUS) IMPLANT
CANISTER SUCTION 2500CC (MISCELLANEOUS) ×2 IMPLANT
CHLORAPREP W/TINT 26ML (MISCELLANEOUS) ×2 IMPLANT
CLIP APPLIE ROT 10 11.4 M/L (STAPLE) ×1 IMPLANT
CLOTH BEACON ORANGE TIMEOUT ST (SAFETY) ×2 IMPLANT
COVER MAYO STAND STRL (DRAPES) ×2 IMPLANT
COVER SURGICAL LIGHT HANDLE (MISCELLANEOUS) ×2 IMPLANT
DECANTER SPIKE VIAL GLASS SM (MISCELLANEOUS) ×4 IMPLANT
DRAPE C-ARM 42X72 X-RAY (DRAPES) ×2 IMPLANT
DRAPE UTILITY 15X26 W/TAPE STR (DRAPE) ×4 IMPLANT
DRSG TEGADERM 4X4.75 (GAUZE/BANDAGES/DRESSINGS) ×2 IMPLANT
ELECT REM PT RETURN 9FT ADLT (ELECTROSURGICAL) ×2
ELECTRODE REM PT RTRN 9FT ADLT (ELECTROSURGICAL) ×1 IMPLANT
FILTER SMOKE EVAC LAPAROSHD (FILTER) ×2 IMPLANT
GAUZE SPONGE 2X2 8PLY STRL LF (GAUZE/BANDAGES/DRESSINGS) ×1 IMPLANT
GLOVE BIO SURGEON STRL SZ7 (GLOVE) ×2 IMPLANT
GLOVE BIO SURGEON STRL SZ7.5 (GLOVE) ×2 IMPLANT
GLOVE BIOGEL PI IND STRL 6.5 (GLOVE) ×1 IMPLANT
GLOVE BIOGEL PI IND STRL 7.0 (GLOVE) ×1 IMPLANT
GLOVE BIOGEL PI IND STRL 7.5 (GLOVE) ×2 IMPLANT
GLOVE BIOGEL PI INDICATOR 6.5 (GLOVE) ×1
GLOVE BIOGEL PI INDICATOR 7.0 (GLOVE) ×1
GLOVE BIOGEL PI INDICATOR 7.5 (GLOVE) ×2
GLOVE SURG SS PI 6.5 STRL IVOR (GLOVE) ×2 IMPLANT
GOWN STRL NON-REIN LRG LVL3 (GOWN DISPOSABLE) ×6 IMPLANT
KIT BASIN OR (CUSTOM PROCEDURE TRAY) ×2 IMPLANT
KIT ROOM TURNOVER OR (KITS) ×2 IMPLANT
NS IRRIG 1000ML POUR BTL (IV SOLUTION) ×2 IMPLANT
PAD ARMBOARD 7.5X6 YLW CONV (MISCELLANEOUS) ×2 IMPLANT
POUCH SPECIMEN RETRIEVAL 10MM (ENDOMECHANICALS) ×2 IMPLANT
SCISSORS LAP 5X35 DISP (ENDOMECHANICALS) ×2 IMPLANT
SET CHOLANGIOGRAPH 5 50 .035 (SET/KITS/TRAYS/PACK) ×2 IMPLANT
SET IRRIG TUBING LAPAROSCOPIC (IRRIGATION / IRRIGATOR) ×2 IMPLANT
SLEEVE ENDOPATH XCEL 5M (ENDOMECHANICALS) ×2 IMPLANT
SPECIMEN JAR SMALL (MISCELLANEOUS) ×2 IMPLANT
SPONGE GAUZE 2X2 STER 10/PKG (GAUZE/BANDAGES/DRESSINGS) ×1
STRIP CLOSURE SKIN 1/2X4 (GAUZE/BANDAGES/DRESSINGS) ×2 IMPLANT
SUT MNCRL AB 4-0 PS2 18 (SUTURE) ×2 IMPLANT
TOWEL OR 17X24 6PK STRL BLUE (TOWEL DISPOSABLE) ×2 IMPLANT
TOWEL OR 17X26 10 PK STRL BLUE (TOWEL DISPOSABLE) ×2 IMPLANT
TRAY LAPAROSCOPIC (CUSTOM PROCEDURE TRAY) ×2 IMPLANT
TROCAR XCEL BLUNT TIP 100MML (ENDOMECHANICALS) ×2 IMPLANT
TROCAR XCEL NON-BLD 11X100MML (ENDOMECHANICALS) ×2 IMPLANT
TROCAR XCEL NON-BLD 5MMX100MML (ENDOMECHANICALS) ×2 IMPLANT

## 2012-08-16 NOTE — Op Note (Signed)
Laparoscopic Cholecystectomy with IOC Procedure Note  Indications: This patient presents with symptomatic gallbladder disease and will undergo laparoscopic cholecystectomy.  Pre-operative Diagnosis: Calculus of bile duct with other cholecystitis, without mention of obstruction  Post-operative Diagnosis: Same  Surgeon: Prabhav Faulkenberry K.   Assistants: none  Anesthesia: General endotracheal anesthesia  ASA Class: 2  Procedure Details  The patient was seen again in the Holding Room. The risks, benefits, complications, treatment options, and expected outcomes were discussed with the patient. The possibilities of reaction to medication, pulmonary aspiration, perforation of viscus, bleeding, recurrent infection, finding a normal gallbladder, the need for additional procedures, failure to diagnose a condition, the possible need to convert to an open procedure, and creating a complication requiring transfusion or operation were discussed with the patient. The likelihood of improving the patient's symptoms with return to their baseline status is good.  The patient and/or family concurred with the proposed plan, giving informed consent. The site of surgery properly noted. The patient was taken to Operating Room, identified as Patricia Travis and the procedure verified as Laparoscopic Cholecystectomy with Intraoperative Cholangiogram. A Time Out was held and the above information confirmed.  Prior to the induction of general anesthesia, antibiotic prophylaxis was administered. General endotracheal anesthesia was then administered and tolerated well. After the induction, the abdomen was prepped with Chloraprep and draped in the sterile fashion. The patient was positioned in the supine position.  Local anesthetic agent was injected into the skin near the umbilicus and an incision made. We dissected down to the abdominal fascia with blunt dissection.  The fascia was incised vertically and we entered the  peritoneal cavity bluntly.  A pursestring suture of 0-Vicryl was placed around the fascial opening.  The Hasson cannula was inserted and secured with the stay suture.  Pneumoperitoneum was then created with CO2 and tolerated well without any adverse changes in the patient's vital signs. An 11-mm port was placed in the subxiphoid position.  Two 5-mm ports were placed in the right upper quadrant. All skin incisions were infiltrated with a local anesthetic agent before making the incision and placing the trocars.   We positioned the patient in reverse Trendelenburg, tilted slightly to the patient's left.  The gallbladder was identified, the fundus grasped and retracted cephalad. Adhesions were lysed bluntly and with the electrocautery where indicated, taking care not to injure any adjacent organs or viscus. The infundibulum was grasped and retracted laterally, exposing the peritoneum overlying the triangle of Calot. This was then divided and exposed in a blunt fashion. A critical view of the cystic duct and cystic artery was obtained.  The cystic duct was clearly identified and bluntly dissected circumferentially. The cystic duct was ligated with a clip distally.   An incision was made in the cystic duct and the [facility] cholangiogram catheter introduced. The catheter was secured using a clip. A cholangiogram was then obtained which showed good visualization of the distal and proximal biliary tree with no sign of filling defects or obstruction.  Contrast flowed easily into the duodenum. The catheter was then removed.   The cystic duct was then ligated with clips and divided. The cystic artery was identified, dissected free, ligated with clips and divided as well.   The gallbladder was dissected from the liver bed in retrograde fashion with the electrocautery. The gallbladder was removed and placed in an Endocatch sac. The liver bed was irrigated and inspected. Hemostasis was achieved with the electrocautery. Copious  irrigation was utilized and was repeatedly aspirated until clear.  The gallbladder and Endocatch sac were then removed through the umbilical port site.  The pursestring suture was used to close the umbilical fascia.    We again inspected the right upper quadrant for hemostasis.  Pneumoperitoneum was released as we removed the trocars.  4-0 Monocryl was used to close the skin.   Benzoin, steri-strips, and clean dressings were applied. The patient was then extubated and brought to the recovery room in stable condition. Instrument, sponge, and needle counts were correct at closure and at the conclusion of the case.   Findings: Cholecystitis with Cholelithiasis  Estimated Blood Loss: Minimal         Drains: none         Specimens: Gallbladder           Complications: None; patient tolerated the procedure well.         Disposition: PACU - hemodynamically stable.         Condition: stable  Patricia Travis. Patricia Skains, MD, [facility]  08/16/2012 10:39 AM

## 2012-08-16 NOTE — Preoperative (Signed)
Beta Blockers   Reason not to administer Beta Blockers:Not Applicable 

## 2012-08-16 NOTE — Anesthesia Procedure Notes (Signed)
Procedure Name: Intubation Date/Time: 08/16/2012 9:22 AM Performed by: Jerilee Hoh Pre-anesthesia Checklist: Patient identified, Emergency Drugs available, Suction available and Patient being monitored Patient Re-evaluated:Patient Re-evaluated prior to inductionOxygen Delivery Method: Circle system utilized Preoxygenation: Pre-oxygenation with 100% oxygen Intubation Type: IV induction Ventilation: Mask ventilation without difficulty Laryngoscope Size: Mac and 3 Grade View: Grade I Tube type: Oral Tube size: 7.5 mm Number of attempts: 1 Airway Equipment and Method: Stylet Placement Confirmation: ETT inserted through vocal cords under direct vision,  positive ETCO2 and breath sounds checked- equal and bilateral Secured at: 22 cm Tube secured with: Tape Dental Injury: Teeth and Oropharynx as per pre-operative assessment

## 2012-08-16 NOTE — Anesthesia Postprocedure Evaluation (Signed)
  Anesthesia Post-op Note  Patient: Patricia Travis  Procedure(s) Performed: Procedure(s) (LRB) with comments: LAPAROSCOPIC CHOLECYSTECTOMY WITH INTRAOPERATIVE CHOLANGIOGRAM (N/A)  Patient Location: PACU  Anesthesia Type: General  Level of Consciousness: awake, alert  and oriented  Airway and Oxygen Therapy: Patient Spontanous Breathing and Patient connected to nasal cannula oxygen  Post-op Pain: mild  Post-op Assessment: Post-op Vital signs reviewed  Post-op Vital Signs: Reviewed  Complications: No apparent anesthesia complications

## 2012-08-16 NOTE — Transfer of Care (Signed)
Immediate Anesthesia Transfer of Care Note  Patient: Patricia Travis  Procedure(s) Performed: Procedure(s) (LRB) with comments: LAPAROSCOPIC CHOLECYSTECTOMY WITH INTRAOPERATIVE CHOLANGIOGRAM (N/A)  Patient Location: PACU  Anesthesia Type: General  Level of Consciousness: awake, alert , oriented and patient cooperative  Airway & Oxygen Therapy: Patient Spontanous Breathing and Patient connected to nasal cannula oxygen  Post-op Assessment: Report given to PACU RN, Post -op Vital signs reviewed and stable and Patient moving all extremities  Post vital signs: Reviewed and stable  Complications: No apparent anesthesia complications

## 2012-08-16 NOTE — H&P (View-Only) (Signed)
Patient ID: Patricia Travis, female   DOB: 08-Jan-1951, 61 y.o.   MRN: [id number]  Chief Complaint  Patient presents with  . Pre-op Exam    eval gallbladder    HPI Patricia Travis is a 61 y.o. female.  Referred by Dr. Kerby Nora for evaluation of gallbladder symptoms HPI This patient presents after a recent hospitalization at [facility]. She had a very interesting hospital course. On September 1 the patient had a meal at [employer]. Later that night she awoke with severe right upper quadrant abdominal pain and nausea. She also felt very bloated. The pain radiated around her upper abdomen in a bandlike fashion all the way to her back.She went to the emergency department on 07/27/12 and underwent a workup during that admission. Her white count was elevated at 15.2. Bilirubin on admission was 0.8 alkaline phosphatase 151, SGPT 118 SGOT 233. Ultrasound showed no sign of stones, no wall thickening, no pericholecystic fluid, normal-appearing common bile duct. A CT scan showed mild gallbladder wall thickening and pericholecystic fluid. A hiatus scan was then performed which was grossly abnormal. There is no sign of filling in either the gallbladder or in the extrahepatic bile ducts. She was seen by a gastroenterologist who performed an ERCP which showed normal bile ducts with no sign of stone or obstruction with filling of the gallbladder. A Sphincterotomy was performed at the time of ERCP. Her bilirubin during hospitalization did increase to a high of 3.6. AST 117 ALT 243. Her white count remained normal through the remainder of her hospitalization.  Currently her main complaint is the constant nausea. She has a feeling of discomfort in her epigastrium and her right upper quadrant but the pain is slightly improved. She has been avoiding any fat in her diet. Her hepatitis serologies have come back negative. She presents now to discuss possible surgical removal of her gallbladder.  Past Medical  History  Diagnosis Date  . GERD (gastroesophageal reflux disease)   . Hyperlipidemia   . Hypertension   . Osteoarthritis   . Osteopenia   . Asthma   . Diabetes mellitus     Past Surgical History  Procedure Date  . Knee arthroscopy 1990    L  . Knee cartilage surgery 1992    L  . Total knee arthroplasty 7/08    L  . Abdominal hysterectomy     total    Family History  Problem Relation Age of Onset  . Heart attack Father   . Coronary artery disease Father   . Heart disease Father   . Arthritis Mother     RA, and osteoarthritis  . Diabetes Sister   . Diabetes      grandparents  . Breast cancer Maternal Grandmother   . Cancer Maternal Grandmother     breast  . Cancer Maternal Aunt     breast  . Cancer Paternal Grandfather     Social History History  Substance Use Topics  . Smoking status: Former Smoker -- 25 years    Quit date: 09/07/1992  . Smokeless tobacco: Never Used  . Alcohol Use: No    Allergies  Allergen Reactions  . Ace Inhibitors     REACTION: cough  . Cortisone Acetate     REACTION: Makes pain worse  . Penicillins     REACTION: throat swelling  . Tetracyclines & Related Swelling    throat    Current Outpatient Prescriptions  Medication Sig Dispense Refill  . amLODipine (NORVASC) 5 MG  tablet TAKE ONE TABLET BY MOUTH ONE TIME DAILY  30 tablet  2  . gabapentin (NEURONTIN) 600 MG tablet take 1 in the am, 2 at night.  90 tablet  0  . losartan (COZAAR) 25 MG tablet 1/2 tablet by mouth daily       . metFORMIN (GLUCOPHAGE) 500 MG tablet TAKE TWO TABLETS BY MOUTH TWICE DAILY  120 tablet  9  . simvastatin (ZOCOR) 40 MG tablet TAKE 1 TABLET DAILY  90 tablet  2  . SPIRIVA HANDIHALER 18 MCG inhalation capsule INHALE ONE CAPSULE BY MOUTH ONE TIME DAILY  30 each  6  . SUMAtriptan (IMITREX) 50 MG tablet Take 50 mg by mouth. As needed migraines       . traMADol (ULTRAM) 50 MG tablet TAKE 1 TABLET EVERY 6 HOURS AS NEEDED FOR PAIN  90 tablet  1  .  HYDROcodone-acetaminophen (NORCO/VICODIN) 5-325 MG per tablet       . prochlorperazine (COMPAZINE) 10 MG tablet         Review of Systems Review of Systems  Constitutional: Negative for fever, chills and unexpected weight change.  HENT: Negative for hearing loss, congestion, sore throat, trouble swallowing and voice change.   Eyes: Negative for visual disturbance.  Respiratory: Negative for cough and wheezing.   Cardiovascular: Negative for chest pain, palpitations and leg swelling.  Gastrointestinal: Positive for nausea, vomiting, diarrhea and abdominal distention. Negative for abdominal pain, constipation, blood in stool and anal bleeding.  Genitourinary: Negative for hematuria, vaginal bleeding and difficulty urinating.  Musculoskeletal: Negative for arthralgias.  Skin: Negative for rash and wound.  Neurological: Negative for seizures, syncope and headaches.  Hematological: Negative for adenopathy. Does not bruise/bleed easily.  Psychiatric/Behavioral: Negative for confusion.    Blood pressure 130/84, pulse 71, temperature 98.4 F (36.9 C), temperature source Temporal, height 5' 5.5" (1.664 m), weight 173 lb 12.8 oz (78.835 kg), SpO2 97.00%.  Physical Exam Physical Exam WDWN in NAD HEENT:  EOMI, sclera anicteric Neck:  No masses, no thyromegaly Lungs:  CTA bilaterally; normal respiratory effort CV:  Regular rate and rhythm; no murmurs Abd:  +bowel sounds, soft, tender in RUQ and epigastrium Ext:  Well-perfused; no edema Skin:  Warm, dry; no sign of jaundice  Data Reviewed Hospital records from [facility]  Assessment    Chronic cholecystitis, likely with choledocholithiasis and spontaneous passage of the common bile duct stone prior to ERCP. Serologies have ruled out hepatitis.    Plan    Laparoscopic cholecystectomy with intraoperative cholangiogram.  The surgical procedure has been discussed with the patient.  Potential risks, benefits, alternative treatments,  and expected outcomes have been explained.  All of the patient's questions at this time have been answered.  The likelihood of reaching the patient's treatment goal is good.  The patient understand the proposed surgical procedure and wishes to proceed.        Marrio Scribner K. 08/05/2012, 5:26 PM

## 2012-08-16 NOTE — Interval H&P Note (Signed)
History and Physical Interval Note:  08/16/2012 7:12 AM  Patricia Travis  has presented today for surgery, with the diagnosis of chronic cholecystitis  The various methods of treatment have been discussed with the patient and family. After consideration of risks, benefits and other options for treatment, the patient has consented to  Procedure(s) (LRB) with comments: LAPAROSCOPIC CHOLECYSTECTOMY WITH INTRAOPERATIVE CHOLANGIOGRAM (N/A) as a surgical intervention .  The patient's history has been reviewed, patient examined, no change in status, stable for surgery.  I have reviewed the patient's chart and labs.  Questions were answered to the patient's satisfaction.     Kendrell Lottman K.

## 2012-08-16 NOTE — Anesthesia Preprocedure Evaluation (Addendum)
Anesthesia Evaluation  Patient identified by MRN, date of birth, ID band Patient awake    Reviewed: Allergy & Precautions, H&P , NPO status , Patient's Chart, lab work & pertinent test results  Airway Mallampati: I TM Distance: >3 FB Neck ROM: Full    Dental  (+) Teeth Intact, Dental Advisory Given, Chipped and Poor Dentition   Pulmonary  breath sounds clear to auscultation        Cardiovascular hypertension, Pt. on medications Rhythm:Regular Rate:Normal     Neuro/Psych    GI/Hepatic GERD-  ,  Endo/Other  diabetes, Well Controlled, Type 2, Oral Hypoglycemic Agents  Renal/GU      Musculoskeletal   Abdominal   Peds  Hematology   Anesthesia Other Findings   Reproductive/Obstetrics                          Anesthesia Physical Anesthesia Plan  ASA: III  Anesthesia Plan: General   Post-op Pain Management:    Induction: Intravenous  Airway Management Planned: Oral ETT  Additional Equipment:   Intra-op Plan:   Post-operative Plan: Extubation in OR  Informed Consent:   Dental advisory given  Plan Discussed with: CRNA, Anesthesiologist and Surgeon  Anesthesia Plan Comments:         Anesthesia Quick Evaluation

## 2012-08-17 ENCOUNTER — Encounter (INDEPENDENT_AMBULATORY_CARE_PROVIDER_SITE_OTHER): Payer: Self-pay

## 2012-08-17 ENCOUNTER — Encounter (HOSPITAL_COMMUNITY): Payer: Self-pay | Admitting: Surgery

## 2012-08-27 ENCOUNTER — Other Ambulatory Visit: Payer: Self-pay | Admitting: Family Medicine

## 2012-08-30 ENCOUNTER — Ambulatory Visit (INDEPENDENT_AMBULATORY_CARE_PROVIDER_SITE_OTHER): Payer: BC Managed Care – PPO | Admitting: Surgery

## 2012-08-30 ENCOUNTER — Other Ambulatory Visit: Payer: Self-pay | Admitting: *Deleted

## 2012-08-30 ENCOUNTER — Encounter (INDEPENDENT_AMBULATORY_CARE_PROVIDER_SITE_OTHER): Payer: Self-pay | Admitting: Surgery

## 2012-08-30 VITALS — BP 121/80 | HR 88 | Temp 97.8°F | Resp 16 | Ht 65.5 in | Wt 173.0 lb

## 2012-08-30 DIAGNOSIS — K811 Chronic cholecystitis: Secondary | ICD-10-CM

## 2012-08-30 NOTE — Progress Notes (Signed)
Status post laparoscopic cholecystectomy with intraoperative cholangiogram on 08/16/12. The pathology showed acute and chronic cholecystitis with gallstones. The patient feels well. She is having some issues with diarrhea. Her incisions are all well-healed with no sign of infection. No abdominal tenderness.  Status post laparoscopic cholecystectomy with postcholecystectomy diarrhea  Plan: The patient may resume full activity. The diarrhea should improve the further out she gets from surgery. Recommend using a daily fiber supplements and when PRN Imodium. Followup as needed  Wilmon Arms. Corliss Skains, MD, [facility]  08/30/2012 10:22 AM

## 2012-08-30 NOTE — Telephone Encounter (Signed)
Pt has not been seen since 11/12, last cpx was 7/12 and she has canceled a f/u since then. Do you want to refill

## 2012-08-31 NOTE — Telephone Encounter (Signed)
I do not believe I am her PCP any longer. Denied.

## 2012-09-01 ENCOUNTER — Other Ambulatory Visit: Payer: Self-pay | Admitting: *Deleted

## 2012-09-01 MED ORDER — AMLODIPINE BESYLATE 5 MG PO TABS: 5.0000 mg | ORAL_TABLET | Freq: Every day | ORAL | Status: DC

## 2012-09-01 MED ORDER — LOSARTAN POTASSIUM 25 MG PO TABS: 25.0000 mg | ORAL_TABLET | Freq: Every day | ORAL | Status: DC

## 2013-02-23 ENCOUNTER — Ambulatory Visit: Payer: Self-pay

## 2014-03-22 ENCOUNTER — Ambulatory Visit: Payer: Self-pay

## 2015-03-09 ENCOUNTER — Other Ambulatory Visit: Payer: Self-pay | Admitting: Family Medicine

## 2015-03-09 DIAGNOSIS — Z1231 Encounter for screening mammogram for malignant neoplasm of breast: Secondary | ICD-10-CM

## 2015-03-13 NOTE — Consult Note (Signed)
Brief Consult Note: Diagnosis: Abnormal GI x-ray.  Finding for concern of CBD obstruction vs cholecstatic hepatitis.  Noted findings of HIDA scan.  CT scan of abdomen and pelvis revealed appearance of acalculus cholecystitis.  Elevation of LFT.  Abdominal pain to right upper quadrant..   Consult note dictated.   Recommend to proceed with surgery or procedure.   Discussed with Attending MD.   Comments: Patient's presentation was discussed with Dr. Lutricia FeilPaul Oh.  Recommendation is to proceed with ERCP.  Order placed.  NPO status.  Continue with antibiotic therapy.  Will continue to monitor laboratory studies.  Electronic Signatures: Rodman KeyHarrison, Patricia S (NP)  (Signed 05-Sep-13 16:17)  Authored: Brief Consult Note   Last Updated: 05-Sep-13 16:17 by Rodman KeyHarrison, Patricia S (NP)

## 2015-03-13 NOTE — H&P (Signed)
PATIENT NAME:  Patricia Travis, Patricia Travis MR#:  [id number] DATE OF BIRTH:  June 13, 1951  DATE OF ADMISSION:  07/27/2012  PRIMARY CARE PHYSICIAN: Dr. [facility] [place]  ADMITTING PHYSICIAN: Dr. Michela Pitcher  CHIEF COMPLAINT: Abdominal pain, nausea, and vomiting.   BRIEF HISTORY: Patricia Travis is a 64 year old woman seen in the Emergency Room with a two day history of significant abdominal pain. The pain is primarily midepigastric, right upper quadrant radiating in a bandlike fashion to her back. She denies any shoulder pain. The pain awoke her from sleep 48 hours ago and has been progressively worsening over that period of time. She has not been pain free since it began although she has had some mild improvement late yesterday. She has had significant nausea and vomited multiple times. She has been unable to keep anything on her stomach. She complains of being mildly diaphoretic but has not had any known fever. She denies any previous similar symptoms.   She has no history of hepatitis, yellow jaundice, pancreatitis, peptic ulcer disease, previous diagnosis of gallbladder disease or diverticulitis. She had an unremarkable colonoscopy in 2006 which has not been repeated in our hospital. She is regularly followed in the [facility] group in [place]. She has a history of hypertension and diabetes non-insulin-dependent. She has no cardiac disease, coronary artery disease or rhythm disturbance. She has no history of congestive heart failure. Only previous surgery is a left knee replacement and a hysterectomy. She does take antihypertensive and diabetic medications but does not know the name of those medications. She has had a pain medicine problem in the past when she had some serious knee and back issues.   ALLERGIES: She is allergic to tetracycline and penicillin and cortisone. Penicillin allergy does cause throat swelling and anaphylactic type reaction.   SOCIAL HISTORY: She is a  reformed cigarette smoker, giving up the habit 20 years ago. She does not drink alcohol. 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 She is in the Emergency Room with her husband.   FAMILY HISTORY: Noncontributory.   REVIEW OF SYSTEMS: Largely unremarkable with the exception of the symptoms noted above. She denies any neurologic symptoms, any urologic symptoms. She does have history of severe claustrophobia.   PHYSICAL EXAMINATION:  GENERAL: She is an alert, somewhat frustrated woman but in no significant distress. She and her husband are anxious about the amount of time it has taken for her evaluation.   VITAL SIGNS: Blood pressure 148/84, heart rate 82 and regular, oxygen saturation 95%.   HEENT: Unremarkable. She has no axial or cervical adenopathy. She has no scleral icterus and no facial deformities. She has a midline trachea and I cannot palpate her thyroid gland.   CHEST: Clear with no adventitious sounds. She has normal pulmonary excursion.   CARDIAC: No murmurs or gallops to my ear and she seems to be in normal sinus rhythm.   ABDOMEN: Her abdomen is soft with no significant distention. She does have some mild midepigastric and more significant right upper quadrant tenderness without rebound or guarding. She has no masses noted. I cannot palpate her gallbladder. She has active bowel sounds.    EXTREMITIES: Lower extremity exam reveals full range of motion, no deformities but some surgical scars on the left knee.   PSYCHIATRIC: Some agitation but normal orientation and affect otherwise.   LABORATORY, DIAGNOSTIC AND RADIOLOGICAL DATA: Work-up in the Emergency Room demonstrated an elevated white blood cell count at 15,200. Hemoglobin 13 with hematocrit 39.9. Platelet  count is normal. Glucose was slightly elevated 153. Bilirubin 0.8, alkaline phosphatase 151, SGPT 118 and SGOT 233. Troponins were normal and her CPKs were normal. Gallbladder ultrasound was performed which was largely  unremarkable with no stones, no gallbladder wall thickening, no pericholecystic fluid, no evidence of ductal obstruction. She has some hepatic steatosis noted. Contrast CT scan was performed which did demonstrate some mild gallbladder wall thickening and some mild pericholecystic fluid consistent with possible acute cholecystitis in the right clinical situation. The surgical service was consulted.   ASSESSMENT AND PLAN: Working diagnosis here would be acalculous cholecystitis. Will plan to admit her to the hospital, control her pain and her nausea. Start her on antibiotic therapy and rehydrate her. Our plan will be to obtain a HIDA scan tomorrow morning to confirm the diagnosis. We will likely enlist the aid of internal medicine physicians with regard to her hypertension and diabetes. This plan has been discussed with the patient and her husband and they are in agreement.    ____________________________ Carmie Endalph L. Ely III, MD rle:cms D: 07/27/2012 23:48:09 ET T: 07/28/2012 08:54:59 ET JOB#: [id number]  cc: Carmie Endalph L. Ely III, MD, <Dictator> Dr. Cameron Sprangankins, [facility], [place]  Aymen Widrig L ELY MD ELECTRONICALLY SIGNED 07/28/2012 20:49

## 2015-03-13 NOTE — Consult Note (Signed)
PATIENT NAME:  Patricia Travis, Patricia Travis MR#:  [id number] DATE OF BIRTH:  [birth year]  DATE OF CONSULTATION:  07/29/2012  REFERRING PHYSICIAN:   CONSULTING PHYSICIAN:  Lutricia FeilPaul Oh, MD/Dawn Mort SawyersS. Harrison, NP   PRIMARY CARE PHYSICIAN: Dr. Vivi Fernsankin, [facility] in [place]   ADMITTING PHYSICIAN: Dr. Michela PitcherEly   REASON FOR CONSULTATION: Elevation in LFTs as well as abnormal GI x-ray findings.   HISTORY OF PRESENT ILLNESS: Ms. Patricia Travis is a 64 year old Caucasian female who presented to [facility] Emergency Room on 07/27/2012 with a two day history of abdominal pain. The patient states she awoke out of a dead sleep with pain to right upper quadrant with a tight band feeling radiating around her entire upper abdomen with associated nausea and vomiting. She was awakened on Monday at 1 a.m. Two hours later she had the onset of nausea and vomiting. After vomiting pain had eased up, had a soreness afterwards. She awoke on Tuesday morning and felt better, [place] the [place] but, unfortunately, the pain came back in similar location and presentation, started with nausea and vomiting again. Does not feel that food makes the pain occur. No fevers. No hematemesis. Tuesday morning she awoke at 4 a.m. and felt okay but, unfortunately, pain recurred again. She has noted some change in bowel habits intermittently for the past month with episodes of diarrhea at times, states she will eat something and it will go "right through her". No rectal bleeding. No melena. No drug use. No recreational drug use. History of colonoscopy in the past which was performed by Dr. Sharyne Peachrew Siegel 03/28/2005 with one polyp being resected. Path report of colonoscopy was hyperplastic. Denies ever having an upper endoscopy performed in the past.   CT scan with contrast was done on 07/27/2012 with findings of the liver to be diffusely low in attenuation. Correlation with hepatic steatosis. No intrahepatic or extrahepatic biliary ductal dilatation.  Gallbladder mildly distended with a small amount of pericholecystic fluid. Spleen demonstrated no focal abnormality. The kidneys, adrenal glands, and pancreas were normal. There was noted a 3.1 cm cystic right ovarian mass. The findings of the gallbladder being distended with a trace of pericholecystic fluid and no cholelithiasis. Appearance felt to be seen with acalculous cholecystitis. HIDA scan was done in follow-up based on these findings which revealed activity throughout the liver without any visualization of the gallbladder, common bile duct, or small bowel after three hours. Findings concerning for complete common bile duct obstruction versus cholestatic hepatitis.   PAST MEDICAL HISTORY:  1. Hypertension.  2. Asthma.  3. Diabetes mellitus. 4. Hypercholesteremia.  5. Diabetic neuropathy. 6. Migraine headaches.  7. Severe claustrophobia.   PAST SURGICAL HISTORY:  1. Hysterectomy with BSO in 1980.  2. Knee replacement, left knee, in 2008.  3. Colonoscopy in 2006 with one polyp being removed, hyperplastic.   OUTPATIENT MEDICATIONS:  1. Norvasc 5 mg a day. 2. Imitrex 50 mg 1 tablet as directed as needed.  3. Losartan 25 mg 1 tablet daily.  4. Metformin 500 mg 2 tablets twice a day. 5. Neurontin 600 mg 2 tablets 3 times a day.  6. Simvastatin 40 mg once a day.  7. Spiriva 1 inhalation once a day. 8. Ultram 50 mg 1 tablet every six hours.  FAMILY HISTORY: Significant for breast cancer. No colon cancer. Significant for diabetes.   SOCIAL HISTORY: Reformed cigarette smoker, quit 20 years ago. No history of heavy alcohol use. 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 Married.  ALLERGIES: Allergic to tetracycline, penicillin, and cortisone. Penicillin does cause an anaphylactic type reaction.   REVIEW OF SYSTEMS: Review of systems is largely unremarkable with the exception of symptoms as noted above. Denies any neurological symptoms or urological symptoms. She does have a history of  severe claustrophobia.     PHYSICAL EXAMINATION:   VITAL SIGNS: Temperature 97.9, pulse 72, respirations 18, blood pressure 125/72, pulse oximetry 95% on room air.   GENERAL: Well developed, well nourished 64 year old Caucasian female who appears to be resting comfortably in bed, husband at bedside.   HEENT: Normocephalic, atraumatic. Pupils equal, reactive to light. Conjunctivae clear. Sclerae anicteric.   NECK: Supple. Trachea midline. No lymphadenopathy or thyromegaly.   PULMONARY: Symmetric rise and fall of chest. Clear to auscultation throughout.   ABDOMEN: Soft, nondistended. Bowel sounds hypoactive. No bruits. No masses. Mild tenderness to right upper quadrant. Significant for Murphy's sign.    RECTAL: Deferred.   MUSCULOSKELETAL: Moving all four extremities. No contractures. No clubbing.   EXTREMITIES: No edema.   PSYCH: Alert and oriented x4. Memory grossly intact.   NEUROLOGICAL: No gross neurological deficits.   LABORATORY, DIAGNOSTIC, AND RADIOLOGICAL DATA: Chemistry panel on admission glucose 153, trended to 162 on September 4th and on September 5th 138. Hepatic panel on admission alkaline phosphatase 151 with an AST of 233 and an ALT of 118. September 4th albumin dropped from 3.9 to 3.2, total bilirubin rose to 2.4, direct bilirubin 1.40, alkaline phosphatase 168 with an AST of 485, ALT of 368. September 5th total protein 6.2, albumin 3.1, total bilirubin 3.6, alkaline phosphatase 97, AST 111, ALT 243. CK total on admission 66 with a CK-MB of less than 0.5. Troponin less than 0.02. WBC on admission was elevated at 15.5 which has normalized to 8.4 today. PT 12.4 with an INR of 0.9. PTT 27.1. Blood cultures x2 no growth in 18 to 24 hours.    Chest x-ray, single view, on September 3rd hypoinflation with bilateral lung base subsegmental linear atelectasis.    IMPRESSION: Findings concerning for common bile duct obstruction versus cholestatic hepatitis. CT scan revealing  evidence of acalculous cholecystitis, elevation in transaminase levels, abdominal pain to right upper quadrant.   IMPRESSION: The patient's presentation was discussed with Dr. Lutricia Feil. Recommendation is to proceed forward with an ERCP today. Procedure risks versus benefits discussed with patient as well as her husband. The patient will continue antibiotic therapy. She is currently receiving ciprofloxacin 500 mg p.o. b.i.d. N.p.o. status. Will continue to monitor laboratory studies.   These services provided by Rodman Key, NP under collaborative agreement with Lutricia Feil, MD.  ____________________________ Rodman Key, NP dsh:drc D: 07/29/2012 16:17:45 ET T: 07/30/2012 08:53:23 ET JOB#: [id number]  cc: Rodman Key, NP, <Dictator> Rodman Key MD ELECTRONICALLY SIGNED 08/03/2012 14:35

## 2015-03-13 NOTE — Consult Note (Signed)
See Dawn Harrison's notes. Pt and husband demanded to have ERCP done today. Risks/complications discussed. ERCP showed normal CBD. Contrast filled GB. Full liquid diet. If LFT does not improve over next few days, will then need liver w/u as well. Will follow. Thanks.  Electronic Signatures: Lutricia Feilh, Konner Warrior (MD)  (Signed on 05-Sep-13 17:44)  Authored  Last Updated: 05-Sep-13 17:44 by Lutricia Feilh, Akia Desroches (MD)

## 2015-03-13 NOTE — Discharge Summary (Signed)
PATIENT NAME:  Patricia Travis, Patricia Travis MR#:  [id number] DATE OF BIRTH:  [birth year]  DATE OF ADMISSION:  07/27/2012 DATE OF DISCHARGE:  07/30/2012  DISCHARGE DIAGNOSES:   1. Abdominal pain. 2. Elevated liver function tests   PROCEDURE: ERCP.   CONSULTANT: Dr. Bluford Kaufmann, Gastroenterology.  HISTORY OF PRESENT ILLNESS/HOSPITAL COURSE: This is a patient who was admitted through the Emergency Room by Dr. Michela Pitcher with epigastric and right upper quadrant pain radiating to her back. This woke her from sleep 48 hours prior to admission, and she had been worsening. She had some nausea and multiple emeses, and a work-up in the Emergency Room with an ultrasound followed by a CT scan suggested cholecystitis; however, there was a discrepancy between the ultrasound and the CT scan. Neither of the studies showed stone disease; however, there was inflammation noted on the CT scan.   The patient was admitted to the hospital with a tentative diagnosis of acute acalculous cholecystitis, and while in the hospital her abdominal pain temporarily resolved, as did her nausea and vomiting. Of interest, on admission her total bilirubin was 0.8, alkaline phosphatase 151, and AST and ALT of 233 and 118, respectively, changed during the hospitalization to the point that the total bilirubin was 3.6 and AST and ALT were 59 and 167, somewhat suggestive of stone passage. However, she had no stones on either of her imaging studies.   A HIDA scan was performed which was grossly abnormal in that neither the gallbladder nor the extrahepatic bile ducts filled with any radioactive tracer, suggesting hepatocellular dysfunction. Dr. Bluford Kaufmann was consulted who saw the patient and performed an ERCP. This demonstrated completely normal bile ducts with no stone or obstruction, and the gallbladder filled. There was no sign of acute cholecystitis.   The patient's liver function tests have remained elevated, and her pain returned slightly but not to the same degree as  when she was admitted. Her nausea returned, but she is able to tolerate liquids and medications are controlling that nausea.  The patient and husband are somewhat frustrated and angry that "nothing is being done," and I reminded them of the conflicting but otherwise negative CT and ultrasound as well as a grossly abnormal HIDA scan and a completely normal ERCP without signs of choledocholithiasis or cholecystitis. Dr. Bluford Kaufmann has ordered hepatitis type studies, including viral and ANA, etc. which may take several days to return. The patient is demanding to be discharged because, "nothing is being done." I suggested that I send her home with pain medication and antinausea medication to be used as needed, and if she worsens she should return to our Emergency Room for follow-up and that Dr. Bluford Kaufmann, who I spoke to personally, could see the patient early in the week once these additional studies have returned. The patient and her husband are in agreement with that plan. They remained somewhat angry that "nothing can be found and nothing has been done," using their words even after reminding them of the multiple negative and conflicting tests. Again, I  reminded them of why I would not recommend a surgical procedure to remove the gallbladder at this point with a normal ERCP, no evidence of stone disease, and conflicting studies about cholecystitis only seen on the CT scan. They understood this, prescriptions were given, and a note was given to the husband for jury duty excuse for the patient. They will follow up with Dr. Bluford Kaufmann. They do not need to follow up in our office as no surgical intervention is necessary at  this time.  ____________________________ Adah Salvageichard E. Excell Seltzerooper, MD rec:cbb D: 07/30/2012 10:50:54 ET T: 08/02/2012 10:51:40 ET JOB#: [id number]  cc: Adah Salvageichard E. Excell Seltzerooper, MD, <Dictator> Lattie HawICHARD E Michala Deblanc MD ELECTRONICALLY SIGNED 08/04/2012 13:07

## 2015-04-18 ENCOUNTER — Ambulatory Visit
Admission: RE | Admit: 2015-04-18 | Discharge: 2015-04-18 | Disposition: A | Discharge status: Home / Self Care | Payer: BLUE CROSS/BLUE SHIELD | Source: Ambulatory Visit | Attending: Family Medicine | Admitting: Family Medicine

## 2015-04-18 DIAGNOSIS — Z1231 Encounter for screening mammogram for malignant neoplasm of breast: Secondary | ICD-10-CM | POA: Diagnosis present

## 2015-12-31 DIAGNOSIS — M25562 Pain in left knee: Secondary | ICD-10-CM | POA: Insufficient documentation

## 2016-03-07 ENCOUNTER — Other Ambulatory Visit: Payer: Self-pay | Admitting: Family Medicine

## 2016-03-07 DIAGNOSIS — Z1231 Encounter for screening mammogram for malignant neoplasm of breast: Secondary | ICD-10-CM

## 2016-04-29 ENCOUNTER — Ambulatory Visit
Admission: RE | Admit: 2016-04-29 | Discharge: 2016-04-29 | Disposition: A | Discharge status: Home / Self Care | Payer: BLUE CROSS/BLUE SHIELD | Source: Ambulatory Visit | Attending: Family Medicine | Admitting: Family Medicine

## 2016-04-29 ENCOUNTER — Other Ambulatory Visit: Payer: Self-pay | Admitting: Family Medicine

## 2016-04-29 DIAGNOSIS — Z1231 Encounter for screening mammogram for malignant neoplasm of breast: Secondary | ICD-10-CM

## 2017-03-26 ENCOUNTER — Other Ambulatory Visit: Payer: Self-pay | Admitting: Family Medicine

## 2017-03-26 DIAGNOSIS — Z1231 Encounter for screening mammogram for malignant neoplasm of breast: Secondary | ICD-10-CM

## 2017-07-08 DIAGNOSIS — J449 Chronic obstructive pulmonary disease, unspecified: Secondary | ICD-10-CM | POA: Diagnosis not present

## 2017-07-08 DIAGNOSIS — R110 Nausea: Secondary | ICD-10-CM | POA: Diagnosis not present

## 2017-07-08 DIAGNOSIS — I10 Essential (primary) hypertension: Secondary | ICD-10-CM | POA: Diagnosis not present

## 2017-07-08 DIAGNOSIS — E119 Type 2 diabetes mellitus without complications: Secondary | ICD-10-CM | POA: Diagnosis not present

## 2017-07-08 DIAGNOSIS — R195 Other fecal abnormalities: Secondary | ICD-10-CM | POA: Diagnosis not present

## 2017-07-08 DIAGNOSIS — E7800 Pure hypercholesterolemia, unspecified: Secondary | ICD-10-CM | POA: Diagnosis not present

## 2017-08-11 ENCOUNTER — Ambulatory Visit (INDEPENDENT_AMBULATORY_CARE_PROVIDER_SITE_OTHER): Payer: Medicare Other | Admitting: Gastroenterology

## 2017-08-11 ENCOUNTER — Other Ambulatory Visit
Admission: RE | Admit: 2017-08-11 | Discharge: 2017-08-11 | Disposition: A | Discharge status: Home / Self Care | Payer: Medicare Other | Source: Ambulatory Visit | Attending: Gastroenterology | Admitting: Gastroenterology

## 2017-08-11 ENCOUNTER — Encounter: Payer: Self-pay | Admitting: Gastroenterology

## 2017-08-11 ENCOUNTER — Encounter (INDEPENDENT_AMBULATORY_CARE_PROVIDER_SITE_OTHER): Payer: Self-pay

## 2017-08-11 ENCOUNTER — Other Ambulatory Visit: Payer: Self-pay

## 2017-08-11 VITALS — BP 146/77 | HR 73 | Temp 98.1°F | Ht 65.0 in | Wt 170.0 lb

## 2017-08-11 DIAGNOSIS — K529 Noninfective gastroenteritis and colitis, unspecified: Secondary | ICD-10-CM

## 2017-08-11 LAB — CBC
HEMATOCRIT: 38.6 % (ref 35.0–47.0)
HEMOGLOBIN: 13.1 g/dL (ref 12.0–16.0)
MCH: 29.9 pg (ref 26.0–34.0)
MCHC: 34 g/dL (ref 32.0–36.0)
MCV: 88.2 fL (ref 80.0–100.0)
Platelets: 223 10*3/uL (ref 150–440)
RBC: 4.37 MIL/uL (ref 3.80–5.20)
RDW: 13 % (ref 11.5–14.5)
WBC: 8.6 10*3/uL (ref 3.6–11.0)

## 2017-08-11 LAB — COMPREHENSIVE METABOLIC PANEL
ALBUMIN: 4.1 g/dL (ref 3.5–5.0)
ALT: 41 U/L (ref 14–54)
ANION GAP: 9 (ref 5–15)
AST: 36 U/L (ref 15–41)
Alkaline Phosphatase: 77 U/L (ref 38–126)
BILIRUBIN TOTAL: 0.3 mg/dL (ref 0.3–1.2)
BUN: 10 mg/dL (ref 6–20)
CALCIUM: 9.4 mg/dL (ref 8.9–10.3)
CO2: 27 mmol/L (ref 22–32)
Chloride: 103 mmol/L (ref 101–111)
Creatinine, Ser: 0.56 mg/dL (ref 0.44–1.00)
GFR calc Af Amer: >60 mL/min (ref >60)
GFR calc non Af Amer: >60 mL/min (ref >60)
GLUCOSE: 196 mg/dL — AB (ref 65–99)
POTASSIUM: 4.4 mmol/L (ref 3.5–5.1)
SODIUM: 139 mmol/L (ref 135–145)
TOTAL PROTEIN: 7.6 g/dL (ref 6.5–8.1)

## 2017-08-11 LAB — SEDIMENTATION RATE: Sed Rate: 19 mm/hr (ref 0–30)

## 2017-08-11 LAB — C-REACTIVE PROTEIN: CRP: 0.9 mg/dL (ref <1.0)

## 2017-08-11 NOTE — Progress Notes (Signed)
Cephas Darby, MD [address]  [place]  [place],  [address]  Main: [phone]  Fax: [phone]    Gastroenterology Consultation  Referring Provider:     [facility] Primary Care Physician:  Aretta Nip, MD Primary Gastroenterologist:  Dr. Cephas Darby Reason for Consultation:     Chronic Diarrhea        HPI:   Patricia Travis is a 66 y.o. y/o female referred by Dr. Aretta Nip, MD  for consultation & management of Chronic diarrhea that started 3 months ago. Describes the stools as watery, nonbloody, 5-6 times daily, immediately after eating, associated with severe bloating, early morning nausea. Denies abdominal pain, rectal bleeding, nocturnal diarrhea. Denies fever, chills, vomiting, weight loss, loss of appetite. She used to have regular BMs prior to this. She denies taking any new medications, over-the-counter supplements, antibiotics, sick contacts or travel. She is retired. She did not have any workup done for chronic diarrhea yet. She takes Imodium 1-2 tablets daily which helps. She denies any upper GI symptoms other than heartburn for which she takes over-the-counter antacids as needed. Lap chole 5-6years ago due to gallstones She had a hysterectomy She denies any family history of GI malignancy, IBD, celiac disease Not have any routine labs done in last few years GI Procedures: Colonoscopy 03/29/2005 normal  Past Medical History:  Diagnosis Date  . Anemia    hx- high school  . Asthma   . Complication of anesthesia    problem being combative awakening from  hysterectomy   79 but had total knee 08 and did fine  . COPD (chronic obstructive pulmonary disease) ([place])   . Diabetes mellitus   . GERD (gastroesophageal reflux disease)    occ  . Headache(784.0)    migraines hx  . Hyperlipidemia   . Hypertension   . Osteoarthritis   . Osteopenia     Past Surgical History:  Procedure Laterality Date  . ABDOMINAL  HYSTERECTOMY     total  . CHOLECYSTECTOMY  08/16/2012   Procedure: LAPAROSCOPIC CHOLECYSTECTOMY WITH INTRAOPERATIVE CHOLANGIOGRAM;  Surgeon: Imogene Burn. Georgette Dover, MD;  Location: [facility];  Service: General;  Laterality: N/A;  . KNEE ARTHROSCOPY  1990   L  . [place] TOTAL KNEE ARTHROPLASTY  7/08   L    Prior to Admission medications   Medication Sig Start Date End Date Taking? Authorizing Provider  amLODipine (NORVASC) 5 MG tablet Take 1 tablet (5 mg total) by mouth daily. 09/01/12  Yes Bedsole, Amy E, MD  gabapentin (NEURONTIN) 600 MG tablet Take 600 mg by mouth 2 (two) times daily. 10/15/11  Yes Clearnce Sorrel, MD  losartan (COZAAR) 25 MG tablet Take 1 tablet (25 mg total) by mouth daily. 09/01/12  Yes Bedsole, Amy E, MD  prochlorperazine (COMPAZINE) 10 MG tablet Take 10 mg by mouth every 6 (six) hours as needed. For nausea/vomiting 07/30/12  Yes [provider]  promethazine (PHENERGAN) 25 MG tablet Take 25 mg by mouth every 6 (six) hours as needed for nausea or vomiting.   Yes [provider]  simvastatin (ZOCOR) 40 MG tablet Take 40 mg by mouth every evening.   Yes [provider]  traMADol (ULTRAM) 50 MG tablet Take 50 mg by mouth every 6 (six) hours as needed. For pain   Yes [provider]  metFORMIN (GLUCOPHAGE) 500 MG tablet Take 1,000 mg by mouth 2 (two) times  daily with a meal.    [provider]  oxyCODONE-acetaminophen (PERCOCET/ROXICET) 5-325 MG per tablet Take 1 tablet by mouth every 4 (four) hours as needed for pain. Patient not taking: Reported on 08/11/2017 08/16/12   Donnie Mesa, MD  SUMAtriptan (IMITREX) 100 MG tablet Take 100 mg by mouth daily as needed. For migraine    [provider]  tiotropium (SPIRIVA) 18 MCG inhalation capsule Place 18 mcg into inhaler and inhale daily as needed. Shortness of breath or wheezing    [provider]    Family History  Problem Relation Age of Onset  .  Heart attack Father   . Coronary artery disease Father   . Heart disease Father   . Arthritis Mother        RA, and osteoarthritis  . Diabetes Sister   . Diabetes Unknown        grandparents  . Cancer Maternal Grandmother        breast  . Breast cancer Maternal Grandmother 33  . Breast cancer Maternal Aunt 70  . Cancer Paternal Grandfather      Social History  Substance Use Topics  . Smoking status: Former Smoker    Packs/day: 1.50    Years: 25.00    Quit date: 09/07/1992  . Smokeless tobacco: Never Used  . Alcohol use No    Allergies as of 08/11/2017 - Review Complete 08/11/2017  Allergen Reaction Noted  . Penicillins Anaphylaxis 02/03/2007  . Ace inhibitors  02/26/2009  . Cortisone acetate    . Tetracycline Other (See Comments) 01/01/2016  . Tetracyclines & related Nausea And Vomiting 10/02/2011    Review of Systems:    All systems reviewed and negative except where noted in HPI.   Physical Exam:  BP (!) 146/77   Pulse 73   Temp 98.1 F (36.7 C) (Oral)   Ht '5\' 5"'  (1.651 m)   Wt 170 lb (77.1 kg)   BMI 28.29 kg/m  No LMP recorded. Patient has had a hysterectomy.  General:   Alert,  Well-developed, well-nourished, pleasant and cooperative in NAD Head:  Normocephalic and atraumatic. Eyes:  Sclera clear, no icterus.   Conjunctiva pink. Ears:  Normal auditory acuity. Nose:  No deformity, discharge, or lesions. Mouth:  No deformity or lesions,oropharynx pink & moist. Neck:  Supple; no masses or thyromegaly. Short and thick neck, double chin Lungs:  Respirations even and unlabored.  Clear throughout to auscultation.   No wheezes, crackles, or rhonchi. No acute distress. Heart:  Regular rate and rhythm; no murmurs, clicks, rubs, or gallops. Abdomen:  Normal bowel sounds.  No bruits.  Soft, mild epigastric tenderness and non-distended without masses, hepatosplenomegaly or hernias noted.  No guarding or rebound tenderness.   Rectal: Nor performed Msk:  Symmetrical  without gross deformities. Good, equal movement & strength bilaterally. Pulses:  Normal pulses noted. Extremities:  No clubbing or edema.  No cyanosis. Neurologic:  Alert and oriented x3;  grossly normal neurologically. Skin:  Intact without significant lesions or rashes. No jaundice. Lymph Nodes:  No significant cervical adenopathy. Psych:  Alert and cooperative. Normal mood and affect.  Imaging Studies: None  Assessment and Plan:   Patricia Travis is a 66 y.o. y/o female with Diabetes, hemoglobin A1c 7.6, hypertension, obesity with 3 months of nonbloody diarrhea associated with bloating and early morning nausea. Epigastric tenderness on exam today.  - Routine labs, ESR, CRP - Celiac serologies - Stool studies for C. difficile and GI pathogen panel - H.  pylori stool antigen - Fecal Calprotectin - Pancreatic fecal elastase - EGD and colonoscopy with biopsies - Can continue to take Imodium as needed in the meantime  Follow up in 2 months or sooner after above workup   Cephas Darby, MD

## 2017-08-12 ENCOUNTER — Other Ambulatory Visit
Admission: RE | Admit: 2017-08-12 | Discharge: 2017-08-12 | Disposition: A | Discharge status: Home / Self Care | Payer: Medicare Other | Source: Ambulatory Visit | Attending: Gastroenterology | Admitting: Gastroenterology

## 2017-08-12 DIAGNOSIS — K529 Noninfective gastroenteritis and colitis, unspecified: Secondary | ICD-10-CM | POA: Diagnosis not present

## 2017-08-12 LAB — MISC LABCORP TEST (SEND OUT): Labcorp test code: 164640

## 2017-08-12 LAB — IGA: IgA: 216 mg/dL (ref 87–352)

## 2017-08-13 LAB — H. PYLORI ANTIGEN, STOOL: H. PYLORI STOOL AG, EIA: NEGATIVE

## 2017-08-18 LAB — PANCREATIC ELASTASE, FECAL: Pancreatic Elastase-1, Stool: >500 ug Elast./g (ref >200)

## 2017-08-18 LAB — CALPROTECTIN, FECAL: Calprotectin, Fecal: 75 ug/g (ref 0–120)

## 2017-08-19 ENCOUNTER — Telehealth: Payer: Self-pay | Admitting: Gastroenterology

## 2017-08-19 MED ORDER — RIFAXIMIN 550 MG PO TABS: 550.0000 mg | ORAL_TABLET | Freq: Three times a day (TID) | ORAL | 0 refills | Status: AC

## 2017-08-19 NOTE — Telephone Encounter (Signed)
Discussed with her that workup of diarrhea so far came back negative. Continues to have diarrhea and takes Imodium 1 pill daily which is helping. She continues to have postprandial bloating and epigastric pain. I will try 2 weeks course of rifaximin for possible small intestinal bacterial overgrowth/IBS-diarrhea  Scheduled EGD and colonoscopy with biopsies

## 2017-08-25 ENCOUNTER — Other Ambulatory Visit: Payer: Self-pay

## 2017-08-25 ENCOUNTER — Telehealth: Payer: Self-pay

## 2017-08-25 DIAGNOSIS — K529 Noninfective gastroenteritis and colitis, unspecified: Secondary | ICD-10-CM

## 2017-08-25 NOTE — Telephone Encounter (Signed)
Patient has been informed rx for [employer] [employer] has been approved and resent to pharmacy.  Thanks [employer]

## 2017-08-26 ENCOUNTER — Encounter: Payer: Self-pay | Admitting: Anesthesiology

## 2017-08-26 ENCOUNTER — Encounter: Payer: Self-pay | Admitting: *Deleted

## 2017-09-01 NOTE — Discharge Instructions (Signed)
General Anesthesia, Adult, Care After °These instructions provide you with information about caring for yourself after your procedure. Your health care provider may also give you more specific instructions. Your treatment has been planned according to current medical practices, but problems sometimes occur. Call your health care provider if you have any problems or questions after your procedure. °What can I expect after the procedure? °After the procedure, it is common to have: °· Vomiting. °· A sore throat. °· Mental slowness. ° °It is common to feel: °· Nauseous. °· Cold or shivery. °· Sleepy. °· Tired. °· Sore or achy, even in parts of your body where you did not have surgery. ° °Follow these instructions at home: °For at least 24 hours after the procedure: °· Do not: °? Participate in activities where you could fall or become injured. °? Drive. °? Use heavy machinery. °? Drink alcohol. °? Take sleeping pills or medicines that cause drowsiness. °? Make important decisions or sign legal documents. °? Take care of children on your own. °· Rest. °Eating and drinking °· If you vomit, drink water, juice, or soup when you can drink without vomiting. °· Drink enough fluid to keep your urine clear or pale yellow. °· Make sure you have little or no nausea before eating solid foods. °· Follow the diet recommended by your health care provider. °General instructions °· Have a responsible adult stay with you until you are awake and alert. °· Return to your normal activities as told by your health care provider. Ask your health care provider what activities are safe for you. °· Take over-the-counter and prescription medicines only as told by your health care provider. °· If you smoke, do not smoke without supervision. °· Keep all follow-up visits as told by your health care provider. This is important. °Contact a health care provider if: °· You continue to have nausea or vomiting at home, and medicines are not helpful. °· You  cannot drink fluids or start eating again. °· You cannot urinate after 8-12 hours. °· You develop a skin rash. °· You have fever. °· You have increasing redness at the site of your procedure. °Get help right away if: °· You have difficulty breathing. °· You have chest pain. °· You have unexpected bleeding. °· You feel that you are having a life-threatening or urgent problem. °This information is not intended to replace advice given to you by your health care provider. Make sure you discuss any questions you have with your health care provider. °Document Released: 02/16/2001 Document Revised: 04/14/2016 Document Reviewed: 10/25/2015 °Elsevier Interactive Patient Education © 2018 Elsevier Inc. ° °

## 2017-09-02 ENCOUNTER — Ambulatory Visit: Admission: RE | Admit: 2017-09-02 | Payer: Medicare Other | Source: Ambulatory Visit | Admitting: Gastroenterology

## 2017-09-02 HISTORY — DX: Nausea with vomiting, unspecified: R11.2

## 2017-09-02 HISTORY — DX: Other specified postprocedural states: Z98.890

## 2017-09-02 HISTORY — DX: Dyspnea, unspecified: R06.00

## 2017-09-02 SURGERY — COLONOSCOPY WITH PROPOFOL
Anesthesia: Choice

## 2017-09-07 ENCOUNTER — Telehealth: Payer: Self-pay | Admitting: Gastroenterology

## 2017-09-07 ENCOUNTER — Other Ambulatory Visit: Payer: Self-pay

## 2017-09-07 DIAGNOSIS — K529 Noninfective gastroenteritis and colitis, unspecified: Secondary | ICD-10-CM

## 2017-09-07 DIAGNOSIS — R110 Nausea: Secondary | ICD-10-CM

## 2017-09-07 NOTE — Telephone Encounter (Signed)
Patient called and stated she is ready to reschedule her colonoscopy

## 2017-09-08 DIAGNOSIS — E119 Type 2 diabetes mellitus without complications: Secondary | ICD-10-CM | POA: Diagnosis not present

## 2017-09-18 ENCOUNTER — Encounter: Payer: Self-pay | Admitting: Anesthesiology

## 2017-09-24 NOTE — Discharge Instructions (Signed)
General Anesthesia, Adult, Care After °These instructions provide you with information about caring for yourself after your procedure. Your health care provider may also give you more specific instructions. Your treatment has been planned according to current medical practices, but problems sometimes occur. Call your health care provider if you have any problems or questions after your procedure. °What can I expect after the procedure? °After the procedure, it is common to have: °· Vomiting. °· A sore throat. °· Mental slowness. ° °It is common to feel: °· Nauseous. °· Cold or shivery. °· Sleepy. °· Tired. °· Sore or achy, even in parts of your body where you did not have surgery. ° °Follow these instructions at home: °For at least 24 hours after the procedure: °· Do not: °? Participate in activities where you could fall or become injured. °? Drive. °? Use heavy machinery. °? Drink alcohol. °? Take sleeping pills or medicines that cause drowsiness. °? Make important decisions or sign legal documents. °? Take care of children on your own. °· Rest. °Eating and drinking °· If you vomit, drink water, juice, or soup when you can drink without vomiting. °· Drink enough fluid to keep your urine clear or pale yellow. °· Make sure you have little or no nausea before eating solid foods. °· Follow the diet recommended by your health care provider. °General instructions °· Have a responsible adult stay with you until you are awake and alert. °· Return to your normal activities as told by your health care provider. Ask your health care provider what activities are safe for you. °· Take over-the-counter and prescription medicines only as told by your health care provider. °· If you smoke, do not smoke without supervision. °· Keep all follow-up visits as told by your health care provider. This is important. °Contact a health care provider if: °· You continue to have nausea or vomiting at home, and medicines are not helpful. °· You  cannot drink fluids or start eating again. °· You cannot urinate after 8-12 hours. °· You develop a skin rash. °· You have fever. °· You have increasing redness at the site of your procedure. °Get help right away if: °· You have difficulty breathing. °· You have chest pain. °· You have unexpected bleeding. °· You feel that you are having a life-threatening or urgent problem. °This information is not intended to replace advice given to you by your health care provider. Make sure you discuss any questions you have with your health care provider. °Document Released: 02/16/2001 Document Revised: 04/14/2016 Document Reviewed: 10/25/2015 °Elsevier Interactive Patient Education © 2018 Elsevier Inc. ° °

## 2017-09-29 ENCOUNTER — Telehealth: Payer: Self-pay | Admitting: Gastroenterology

## 2017-09-29 NOTE — Telephone Encounter (Signed)
Patient has a family emergency and has to cancel her procedure for tomorrow 09/30/17.

## 2017-09-29 NOTE — Telephone Encounter (Signed)
Received call to cancel colonoscopy due to family emergency.  Colonoscopy has been canceled for tomorrow. Thanks [employer]

## 2017-09-30 ENCOUNTER — Ambulatory Visit: Admission: RE | Admit: 2017-09-30 | Payer: Medicare Other | Source: Ambulatory Visit | Admitting: Gastroenterology

## 2017-09-30 SURGERY — COLONOSCOPY WITH PROPOFOL
Anesthesia: Choice

## 2017-10-12 ENCOUNTER — Ambulatory Visit: Payer: Medicare Other | Admitting: Gastroenterology

## 2017-11-09 DIAGNOSIS — M25511 Pain in right shoulder: Secondary | ICD-10-CM | POA: Diagnosis not present

## 2017-11-09 DIAGNOSIS — E7800 Pure hypercholesterolemia, unspecified: Secondary | ICD-10-CM | POA: Diagnosis not present

## 2017-11-09 DIAGNOSIS — E1165 Type 2 diabetes mellitus with hyperglycemia: Secondary | ICD-10-CM | POA: Diagnosis not present

## 2017-11-09 DIAGNOSIS — I10 Essential (primary) hypertension: Secondary | ICD-10-CM | POA: Diagnosis not present

## 2017-11-09 DIAGNOSIS — Z7984 Long term (current) use of oral hypoglycemic drugs: Secondary | ICD-10-CM | POA: Diagnosis not present

## 2017-12-03 DIAGNOSIS — G43719 Chronic migraine without aura, intractable, without status migrainosus: Secondary | ICD-10-CM | POA: Diagnosis not present

## 2018-03-22 DIAGNOSIS — I10 Essential (primary) hypertension: Secondary | ICD-10-CM | POA: Diagnosis not present

## 2018-03-22 DIAGNOSIS — Z23 Encounter for immunization: Secondary | ICD-10-CM | POA: Diagnosis not present

## 2018-03-22 DIAGNOSIS — B070 Plantar wart: Secondary | ICD-10-CM | POA: Diagnosis not present

## 2018-03-22 DIAGNOSIS — E7800 Pure hypercholesterolemia, unspecified: Secondary | ICD-10-CM | POA: Diagnosis not present

## 2018-03-22 DIAGNOSIS — E119 Type 2 diabetes mellitus without complications: Secondary | ICD-10-CM | POA: Diagnosis not present

## 2018-03-22 DIAGNOSIS — G8929 Other chronic pain: Secondary | ICD-10-CM | POA: Diagnosis not present

## 2018-05-21 DIAGNOSIS — E119 Type 2 diabetes mellitus without complications: Secondary | ICD-10-CM | POA: Diagnosis not present

## 2018-05-24 DIAGNOSIS — G43719 Chronic migraine without aura, intractable, without status migrainosus: Secondary | ICD-10-CM | POA: Diagnosis not present

## 2018-09-22 DIAGNOSIS — J449 Chronic obstructive pulmonary disease, unspecified: Secondary | ICD-10-CM | POA: Diagnosis not present

## 2018-09-22 DIAGNOSIS — G8929 Other chronic pain: Secondary | ICD-10-CM | POA: Diagnosis not present

## 2018-09-22 DIAGNOSIS — E7800 Pure hypercholesterolemia, unspecified: Secondary | ICD-10-CM | POA: Diagnosis not present

## 2018-09-22 DIAGNOSIS — M7660 Achilles tendinitis, unspecified leg: Secondary | ICD-10-CM | POA: Diagnosis not present

## 2018-09-22 DIAGNOSIS — I10 Essential (primary) hypertension: Secondary | ICD-10-CM | POA: Diagnosis not present

## 2018-09-22 DIAGNOSIS — M2040 Other hammer toe(s) (acquired), unspecified foot: Secondary | ICD-10-CM | POA: Diagnosis not present

## 2018-09-22 DIAGNOSIS — E1165 Type 2 diabetes mellitus with hyperglycemia: Secondary | ICD-10-CM | POA: Diagnosis not present

## 2018-10-08 ENCOUNTER — Ambulatory Visit (INDEPENDENT_AMBULATORY_CARE_PROVIDER_SITE_OTHER): Payer: Medicare Other

## 2018-10-08 ENCOUNTER — Ambulatory Visit (INDEPENDENT_AMBULATORY_CARE_PROVIDER_SITE_OTHER): Payer: Medicare Other | Admitting: Podiatry

## 2018-10-08 ENCOUNTER — Encounter: Payer: Self-pay | Admitting: Podiatry

## 2018-10-08 ENCOUNTER — Other Ambulatory Visit: Payer: Self-pay | Admitting: Podiatry

## 2018-10-08 VITALS — BP 133/82 | HR 72 | Temp 97.8°F

## 2018-10-08 DIAGNOSIS — M79671 Pain in right foot: Secondary | ICD-10-CM | POA: Diagnosis not present

## 2018-10-08 DIAGNOSIS — M79672 Pain in left foot: Secondary | ICD-10-CM | POA: Diagnosis not present

## 2018-10-08 DIAGNOSIS — M722 Plantar fascial fibromatosis: Secondary | ICD-10-CM

## 2018-10-08 DIAGNOSIS — M659 Synovitis and tenosynovitis, unspecified: Secondary | ICD-10-CM

## 2018-10-08 DIAGNOSIS — M778 Other enthesopathies, not elsewhere classified: Secondary | ICD-10-CM

## 2018-10-08 DIAGNOSIS — M779 Enthesopathy, unspecified: Secondary | ICD-10-CM

## 2018-10-08 MED ORDER — MELOXICAM 15 MG PO TABS: 15.0000 mg | ORAL_TABLET | Freq: Every day | ORAL | 1 refills | Status: DC

## 2018-10-08 MED ORDER — METHYLPREDNISOLONE 4 MG PO TBPK: ORAL_TABLET | ORAL | 0 refills | Status: DC

## 2018-10-11 NOTE — Progress Notes (Signed)
   Subjective: 67 year old female with PMHx of DM presenting today as a new patient with a chief complaint of constant dull aching, throbbing pain diffusely over bilateral feet that has been ongoing for the past year. She states the pain is moving into her ankles and is worsening. Standing and walking increase the pain. She has been taking Tylenol which helps temporarily relieve some of the pain. Patient is here for further evaluation and treatment.   Past Medical History:  Diagnosis Date  . Anemia    hx- high school  . Asthma   . Complication of anesthesia    problem being combative awakening from  hysterectomy   79 but had total knee 08 and did fine  . COPD (chronic obstructive pulmonary disease) (HCC)   . Diabetes mellitus   . Dyspnea   . GERD (gastroesophageal reflux disease)    occ  . Headache(784.0)    migraines hx - couple/month  . Hyperlipidemia   . Hypertension   . Osteoarthritis    fingers, hands  . Osteopenia   . PONV (postoperative nausea and vomiting)      Objective: Physical Exam General: The patient is alert and oriented x3 in no acute distress.  Dermatology: Skin is warm, dry and supple bilateral lower extremities. Negative for open lesions or macerations bilateral.   Vascular: Dorsalis Pedis and Posterior Tibial pulses palpable bilateral.  Capillary fill time is immediate to all digits.  Neurological: Epicritic and protective threshold intact bilateral.   Musculoskeletal: Tenderness to palpation to the plantar aspect of the bilateral heels along the plantar fascia; to the anterior, medial and lateral aspects of the bilateral ankles; to the midfoot bilaterally. All other joints range of motion within normal limits bilateral. Strength 5/5 in all groups bilateral.   Radiographic exam: Normal osseous mineralization. Joint spaces preserved. No fracture/dislocation/boney destruction. No other soft tissue abnormalities or radiopaque foreign bodies.    Assessment: 1. plantar fasciitis bilateral feet 2. Ankle synovitis bilateral  3. Midfoot capsulitis bilateral 4. Generalized foot pain bilateral  Plan of Care:  1. Patient evaluated. Xrays reviewed.   2. Declined injections today due to previous bad experience with left knee.   3. Rx for Medrol Dose Pak placed 4. Rx for Meloxicam ordered for patient. 5. Plantar fascial band(s) dispensed for bilateral plantar fasciitis. 6. Instructed patient regarding therapies and modalities at home to alleviate symptoms.  7. Return to clinic in 4 weeks.    Felecia ShellingBrent M. Evans, DPM [facility]  Dr. Felecia ShellingBrent M. Evans, DPM    [address]                                   [place], [place] [address]                Office [phone]  Fax [phone]

## 2018-11-09 ENCOUNTER — Encounter: Payer: Self-pay | Admitting: Podiatry

## 2018-11-09 ENCOUNTER — Ambulatory Visit (INDEPENDENT_AMBULATORY_CARE_PROVIDER_SITE_OTHER): Payer: Medicare Other | Admitting: Podiatry

## 2018-11-09 DIAGNOSIS — M779 Enthesopathy, unspecified: Secondary | ICD-10-CM

## 2018-11-09 DIAGNOSIS — M659 Synovitis and tenosynovitis, unspecified: Secondary | ICD-10-CM | POA: Diagnosis not present

## 2018-11-09 DIAGNOSIS — M79672 Pain in left foot: Secondary | ICD-10-CM | POA: Diagnosis not present

## 2018-11-09 DIAGNOSIS — M778 Other enthesopathies, not elsewhere classified: Secondary | ICD-10-CM

## 2018-11-09 DIAGNOSIS — M722 Plantar fascial fibromatosis: Secondary | ICD-10-CM

## 2018-11-09 DIAGNOSIS — M79671 Pain in right foot: Secondary | ICD-10-CM

## 2018-11-09 NOTE — Progress Notes (Signed)
   Subjective: 67 year old female with PMHx of DM presenting today for follow up evaluation of bilateral foot and ankle pain. She states her pain has not changed much since her previous appointment. She has been wearing the fascial braces which have not provided any significant relief. Patient is here for further evaluation and treatment.   Past Medical History:  Diagnosis Date  . Anemia    hx- high school  . Asthma   . Complication of anesthesia    problem being combative awakening from  hysterectomy   79 but had total knee 08 and did fine  . COPD (chronic obstructive pulmonary disease) (HCC)   . Diabetes mellitus   . Dyspnea   . GERD (gastroesophageal reflux disease)    occ  . Headache(784.0)    migraines hx - couple/month  . Hyperlipidemia   . Hypertension   . Osteoarthritis    fingers, hands  . Osteopenia   . PONV (postoperative nausea and vomiting)      Objective: Physical Exam General: The patient is alert and oriented x3 in no acute distress.  Dermatology: Skin is warm, dry and supple bilateral lower extremities. Negative for open lesions or macerations bilateral.   Vascular: Dorsalis Pedis and Posterior Tibial pulses palpable bilateral.  Capillary fill time is immediate to all digits.  Neurological: Epicritic and protective threshold intact bilateral.   Musculoskeletal: Tenderness to palpation to the plantar aspect of the bilateral heels along the plantar fascia; to the anterior, medial and lateral aspects of the bilateral ankles; to the midfoot bilaterally. All other joints range of motion within normal limits bilateral. Strength 5/5 in all groups bilateral.    Assessment: 1. plantar fasciitis bilateral feet 2. Ankle synovitis bilateral  3. Midfoot capsulitis bilateral 4. Generalized foot pain bilateral  Plan of Care:  1. Patient evaluated. 2. Declined injections today due to previous bad experience with left knee.   3. Continue taking Meloxicam daily.  4.  Physical therapy ordered twice weekly for four weeks.  5. Recommended good supportive tennis shoes, but patient says she cannot afford them.  6. Return to clinic in 6 weeks.    Felecia ShellingBrent M. Curtistine Pettitt, DPM [facility]  Dr. Felecia ShellingBrent M. Anjelica Gorniak, DPM    [address]                                   [place], [place] [address]                Office [phone]  Fax [phone]

## 2018-11-11 DIAGNOSIS — M25571 Pain in right ankle and joints of right foot: Secondary | ICD-10-CM | POA: Diagnosis not present

## 2018-11-11 DIAGNOSIS — M25572 Pain in left ankle and joints of left foot: Secondary | ICD-10-CM | POA: Diagnosis not present

## 2018-11-16 DIAGNOSIS — M25572 Pain in left ankle and joints of left foot: Secondary | ICD-10-CM | POA: Diagnosis not present

## 2018-11-16 DIAGNOSIS — M25571 Pain in right ankle and joints of right foot: Secondary | ICD-10-CM | POA: Diagnosis not present

## 2018-11-19 DIAGNOSIS — M25572 Pain in left ankle and joints of left foot: Secondary | ICD-10-CM | POA: Diagnosis not present

## 2018-11-19 DIAGNOSIS — M25571 Pain in right ankle and joints of right foot: Secondary | ICD-10-CM | POA: Diagnosis not present

## 2018-11-25 DIAGNOSIS — M25572 Pain in left ankle and joints of left foot: Secondary | ICD-10-CM | POA: Diagnosis not present

## 2018-11-25 DIAGNOSIS — M25571 Pain in right ankle and joints of right foot: Secondary | ICD-10-CM | POA: Diagnosis not present

## 2018-11-26 DIAGNOSIS — M25572 Pain in left ankle and joints of left foot: Secondary | ICD-10-CM | POA: Diagnosis not present

## 2018-11-26 DIAGNOSIS — M25571 Pain in right ankle and joints of right foot: Secondary | ICD-10-CM | POA: Diagnosis not present

## 2018-12-06 DIAGNOSIS — G43719 Chronic migraine without aura, intractable, without status migrainosus: Secondary | ICD-10-CM | POA: Diagnosis not present

## 2018-12-08 ENCOUNTER — Other Ambulatory Visit: Payer: Self-pay | Admitting: Family Medicine

## 2018-12-08 DIAGNOSIS — Z1231 Encounter for screening mammogram for malignant neoplasm of breast: Secondary | ICD-10-CM

## 2018-12-14 ENCOUNTER — Ambulatory Visit (INDEPENDENT_AMBULATORY_CARE_PROVIDER_SITE_OTHER): Payer: Medicare Other | Admitting: Podiatry

## 2018-12-14 ENCOUNTER — Encounter: Payer: Self-pay | Admitting: Podiatry

## 2018-12-14 DIAGNOSIS — M779 Enthesopathy, unspecified: Secondary | ICD-10-CM | POA: Diagnosis not present

## 2018-12-14 DIAGNOSIS — M79671 Pain in right foot: Secondary | ICD-10-CM

## 2018-12-14 DIAGNOSIS — M778 Other enthesopathies, not elsewhere classified: Secondary | ICD-10-CM

## 2018-12-14 DIAGNOSIS — M659 Synovitis and tenosynovitis, unspecified: Secondary | ICD-10-CM

## 2018-12-14 DIAGNOSIS — M722 Plantar fascial fibromatosis: Secondary | ICD-10-CM | POA: Diagnosis not present

## 2018-12-14 DIAGNOSIS — M79672 Pain in left foot: Secondary | ICD-10-CM

## 2018-12-14 NOTE — Progress Notes (Signed)
   Subjective: 68 year old female with PMHx of DM presenting today for follow up evaluation of bilateral foot and ankle pain. She states the pain has intensified across the dorsum of both feet as well as the lateral ankles. She states the pain is constant. She has been taking Meloxicam as directed. She denies modifying factors. Patient is here for further evaluation and treatment.   Past Medical History:  Diagnosis Date  . Anemia    hx- high school  . Asthma   . Complication of anesthesia    problem being combative awakening from  hysterectomy   79 but had total knee 08 and did fine  . COPD (chronic obstructive pulmonary disease) (HCC)   . Diabetes mellitus   . Dyspnea   . GERD (gastroesophageal reflux disease)    occ  . Headache(784.0)    migraines hx - couple/month  . Hyperlipidemia   . Hypertension   . Osteoarthritis    fingers, hands  . Osteopenia   . PONV (postoperative nausea and vomiting)      Objective: Physical Exam General: The patient is alert and oriented x3 in no acute distress.  Dermatology: Skin is warm, dry and supple bilateral lower extremities. Negative for open lesions or macerations bilateral.   Vascular: Dorsalis Pedis and Posterior Tibial pulses palpable bilateral.  Capillary fill time is immediate to all digits.  Neurological: Epicritic and protective threshold intact bilateral.   Musculoskeletal: Tenderness to palpation to the plantar aspect of the bilateral heels along the plantar fascia; to the anterior, medial and lateral aspects of the bilateral ankles; to the midfoot bilaterally. All other joints range of motion within normal limits bilateral. Strength 5/5 in all groups bilateral.    Assessment: 1. plantar fasciitis bilateral feet 2. Ankle synovitis bilateral  3. Midfoot capsulitis bilateral 4. Generalized foot pain bilateral  Plan of Care:  1. Patient evaluated. 2. Physical therapy did not benefit the patient, so she discontinued.  3.  Continue taking Meloxicam.  4. Recommended OTC insoles from [employer].  5. Recommended prescription compounding antiinflammatory cream, but patient cannot afford it.  6. Offered pain management referral, but patient declined.  7. Return to clinic as needed.   Felecia Shelling, DPM [facility]  Dr. Felecia Shelling, DPM    [address] [place], [place] [address]                Office [phone]  Fax [phone]

## 2018-12-24 ENCOUNTER — Ambulatory Visit
Admission: RE | Admit: 2018-12-24 | Discharge: 2018-12-24 | Disposition: A | Discharge status: Home / Self Care | Payer: Medicare Other | Source: Ambulatory Visit | Attending: Family Medicine | Admitting: Family Medicine

## 2018-12-24 DIAGNOSIS — Z1231 Encounter for screening mammogram for malignant neoplasm of breast: Secondary | ICD-10-CM | POA: Diagnosis not present

## 2019-01-27 ENCOUNTER — Other Ambulatory Visit: Payer: Self-pay | Admitting: Podiatry

## 2019-02-07 DIAGNOSIS — E119 Type 2 diabetes mellitus without complications: Secondary | ICD-10-CM | POA: Diagnosis not present

## 2019-02-07 DIAGNOSIS — I10 Essential (primary) hypertension: Secondary | ICD-10-CM | POA: Diagnosis not present

## 2019-02-07 DIAGNOSIS — G8929 Other chronic pain: Secondary | ICD-10-CM | POA: Diagnosis not present

## 2019-02-07 DIAGNOSIS — E7800 Pure hypercholesterolemia, unspecified: Secondary | ICD-10-CM | POA: Diagnosis not present

## 2019-02-07 DIAGNOSIS — Z7984 Long term (current) use of oral hypoglycemic drugs: Secondary | ICD-10-CM | POA: Diagnosis not present

## 2019-02-07 DIAGNOSIS — Z1159 Encounter for screening for other viral diseases: Secondary | ICD-10-CM | POA: Diagnosis not present

## 2019-02-07 DIAGNOSIS — J449 Chronic obstructive pulmonary disease, unspecified: Secondary | ICD-10-CM | POA: Diagnosis not present

## 2019-05-23 DIAGNOSIS — M25561 Pain in right knee: Secondary | ICD-10-CM | POA: Diagnosis not present

## 2019-05-30 ENCOUNTER — Ambulatory Visit (INDEPENDENT_AMBULATORY_CARE_PROVIDER_SITE_OTHER): Payer: Medicare Other

## 2019-05-30 ENCOUNTER — Encounter: Payer: Self-pay | Admitting: Orthopaedic Surgery

## 2019-05-30 ENCOUNTER — Other Ambulatory Visit: Payer: Self-pay

## 2019-05-30 ENCOUNTER — Ambulatory Visit (INDEPENDENT_AMBULATORY_CARE_PROVIDER_SITE_OTHER): Payer: Medicare Other | Admitting: Orthopaedic Surgery

## 2019-05-30 DIAGNOSIS — M25561 Pain in right knee: Secondary | ICD-10-CM

## 2019-05-30 MED ORDER — METHYLPREDNISOLONE ACETATE 40 MG/ML IJ SUSP
40.0000 mg | INTRAMUSCULAR | Status: AC | PRN
  Administered 2019-05-30: 40 mg via INTRA_ARTICULAR

## 2019-05-30 MED ORDER — LIDOCAINE HCL 1 % IJ SOLN
3.0000 mL | INTRAMUSCULAR | Status: AC | PRN
  Administered 2019-05-30: 3 mL

## 2019-05-30 NOTE — Progress Notes (Signed)
Office Visit Note   Patient: Patricia Travis           Date of Birth: 05-25-1951           MRN: [id number] Visit Date: 05/30/2019              Requested by: Clayborn Heronankins, Victoria R, MD [address] [place],  [place] [address] PCP: Clayborn Heronankins, Victoria R, MD   Assessment & Plan: Visit Diagnoses:  1. Acute pain of right knee     Plan: I did talk to her about aspirating the fluid off the knee and only got about 15 cc of clear fluid from the knee.  I then placed a steroid injection in the knee.  I will have her try Voltaren gel as an over-the-counter supplement to place on the medial side of her knee and will try a hinged knee brace.  I would like to only see her back in 2 weeks.  She will work on Dance movement psychotherapistquad strengthening exercises.  If she is not improved an MRI would be warranted.  All question concerns were answered and addressed.  Follow-Up Instructions: Return in about 2 weeks (around 06/13/2019).   Orders:  Orders Placed This Encounter  Procedures  . Large Joint Inj  . XR Knee 1-2 Views Right   No orders of the defined types were placed in this encounter.     Procedures: Large Joint Inj: R knee on 05/30/2019 8:33 AM Indications: diagnostic evaluation and pain Details: 22 G 1.5 in needle, superolateral approach  Arthrogram: No  Medications: 3 mL lidocaine 1 %; 40 mg methylPREDNISolone acetate 40 MG/ML Outcome: tolerated well, no immediate complications Procedure, treatment alternatives, risks and benefits explained, specific risks discussed. Consent was given by the patient. Immediately prior to procedure a time out was called to verify the correct patient, procedure, equipment, support staff and site/side marked as required. Patient was prepped and draped in the usual sterile fashion.       Clinical Data: No additional findings.   Subjective: Chief Complaint  Patient presents with  . Right Knee - Pain  Patient is a very pleasant 68 year old female who comes in with a  one-month history of right knee pain and swelling.  She cannot think of any injury that she has had.  She points mainly to the medial side of her knee as a source of her pain.  Is a throbbing type of pain.  She is a diabetic but reports decent control.  She is never had a history of gout.  She does have a history of a left total knee arthroplasty done in 2008.  [facility]  It was done for significant arthritis.  She has not had any issues with her right knee until about a month ago.  HPI  Review of Systems She currently denies any headache, chest pain, shortness of breath, fever, chills, nausea, vomiting  Objective: Vital Signs: There were no vitals taken for this visit.  Physical Exam She is alert and orient x3 and in no acute distress.  She does walk with a limp favoring her right knee. Ortho Exam Examination of her right knee shows significant medial joint line tenderness and tenderness over the medial collateral ligament.  She also has a lot of pain in that knee past 90 degrees of flexion.  The knee feels ligamentously stable but is very painful on exam.  There is a mild effusion. Specialty Comments:  No specialty comments available.  Imaging: Xr Knee 1-2 Views Right  Result Date: 05/30/2019 An AP and lateral of the right knee shows no acute findings.  There is a slight effusion.  There is only slight varus malalignment.  The joint space is still well-maintained.    PMFS History: Patient Active Problem List   Diagnosis Date Noted  . Left knee pain 12/31/2015  . Chronic cholecystitis 08/05/2012  . Left arm pain 09/25/2011  . Left arm numbness 09/25/2011  . Tinnitus 09/12/2011  . MICROALBUMINURIA 01/03/2011  . VITAMIN B12 DEFICIENCY 03/13/2010  . PERIPHERAL NEUROPATHY 03/12/2010  . COPD, MILD 08/23/2009  . CARPAL TUNNEL SYNDROME, LEFT 05/08/2009  . ARTHRITIS, CARPOMETACARPAL JOINT 05/08/2009  . DIABETES MELLITUS, TYPE II 08/02/2008  .  HYPERLIPIDEMIA 02/07/2007  . MIGRAINE, CLASSICAL W/O INTRACTABLE MIGRAINE 02/07/2007  . HYPERTENSION 02/07/2007  . GERD 02/07/2007  . OSTEOARTHRITIS 02/07/2007  . KNEE PAIN, LEFT, CHRONIC 02/07/2007  . OSTEOPENIA 02/07/2007   Past Medical History:  Diagnosis Date  . Anemia    hx- high school  . Asthma   . Complication of anesthesia    problem being combative awakening from  hysterectomy   79 but had total knee 08 and did fine  . COPD (chronic obstructive pulmonary disease) ([place])   . Diabetes mellitus   . Dyspnea   . GERD (gastroesophageal reflux disease)    occ  . Headache(784.0)    migraines hx - couple/month  . Hyperlipidemia   . Hypertension   . Osteoarthritis    fingers, hands  . Osteopenia   . PONV (postoperative nausea and vomiting)     Family History  Problem Relation Age of Onset  . Heart attack Father   . Coronary artery disease Father   . Heart disease Father   . Arthritis Mother        RA, and osteoarthritis  . Cancer Maternal Grandmother        breast  . Breast cancer Maternal Grandmother 79  . Breast cancer Maternal Aunt 70  . Diabetes Sister   . Diabetes Other        grandparents  . Cancer Paternal Grandfather     Past Surgical History:  Procedure Laterality Date  . ABDOMINAL HYSTERECTOMY     total  . CHOLECYSTECTOMY  08/16/2012   Procedure: LAPAROSCOPIC CHOLECYSTECTOMY WITH INTRAOPERATIVE CHOLANGIOGRAM;  Surgeon: Imogene Burn. Georgette Dover, MD;  Location: [facility];  Service: General;  Laterality: N/A;  . KNEE ARTHROSCOPY  1990   L  . [place]    . TOTAL KNEE ARTHROPLASTY Left 06/01/2007   Social History   Occupational History  . Occupation: Scientist, research (medical): [employer]    Comment: Accounts Payable  Tobacco Use  . Smoking status: Former Smoker    Packs/day: 1.50    Years: 25.00    Pack years: 68.50    Quit date: 09/07/1992    Years since quitting: 26.7  . Smokeless tobacco: Never Used  Substance and  Sexual Activity  . Alcohol use: No  . Drug use: No  . Sexual activity: Not on file

## 2019-06-10 DIAGNOSIS — G43719 Chronic migraine without aura, intractable, without status migrainosus: Secondary | ICD-10-CM | POA: Diagnosis not present

## 2019-06-13 ENCOUNTER — Ambulatory Visit (INDEPENDENT_AMBULATORY_CARE_PROVIDER_SITE_OTHER): Payer: Medicare Other | Admitting: Orthopaedic Surgery

## 2019-06-13 ENCOUNTER — Other Ambulatory Visit: Payer: Self-pay

## 2019-06-13 ENCOUNTER — Encounter: Payer: Self-pay | Admitting: Orthopaedic Surgery

## 2019-06-13 DIAGNOSIS — M25561 Pain in right knee: Secondary | ICD-10-CM | POA: Diagnosis not present

## 2019-06-13 NOTE — Progress Notes (Signed)
The patient comes in today as follow-up for acute right knee pain.  We saw her 2 weeks ago and placed a steroid injection in the right knee.  She states that that has helped.  She does have a history of a left knee replacement was done remotely elsewhere.  He has still little bit of medial and lateral tenderness but she says is not nearly as bad as what it was only placed a steroid in her knee.  She said it did take several days for last 2 start having an effect.  On exam of her right knee there is no effusion.  The knee is ligamentously stable with good range of motion.  At this point follow-up can be as needed since she is doing well.  If her pain continues, I would recommend an MRI of the right knee given her plain film findings still showing well-maintained joint space.  I did give her handout about hyaluronic acid as well.  All questions concerns were answered and addressed.

## 2019-06-21 ENCOUNTER — Telehealth: Payer: Self-pay | Admitting: Orthopaedic Surgery

## 2019-06-21 NOTE — Telephone Encounter (Signed)
Can we get this authorized?

## 2019-06-21 NOTE — Telephone Encounter (Signed)
Patient left a voice mail message requesting an appointment for a Synvisc One Gel Injection.  CB#[phone] or [phone].  Thank you.

## 2019-06-23 ENCOUNTER — Telehealth: Payer: Self-pay | Admitting: Orthopaedic Surgery

## 2019-06-23 NOTE — Telephone Encounter (Signed)
Patient called asked if she can get the gel injection for her right knee? The number to contact patient is [phone]

## 2019-06-23 NOTE — Telephone Encounter (Signed)
Can we order this for her?  

## 2019-06-24 NOTE — Telephone Encounter (Signed)
Noted.  Duplicate. 

## 2019-06-24 NOTE — Telephone Encounter (Signed)
Noted  

## 2019-06-27 ENCOUNTER — Telehealth: Payer: Self-pay

## 2019-06-27 NOTE — Telephone Encounter (Signed)
Submitted VOB for Monovisc, right knee. 

## 2019-07-04 ENCOUNTER — Telehealth: Payer: Self-pay

## 2019-07-04 NOTE — Telephone Encounter (Addendum)
Called and left a VM advising patient to CB and schedule appointment with Dr. Ninfa Linden for gel injection.  Approved for Monovisc, right knee. [place] deductible has been met Secondary insurance will pick up remaining eligible expenses at 20%. No Co-pay No PA required

## 2019-07-07 ENCOUNTER — Ambulatory Visit (INDEPENDENT_AMBULATORY_CARE_PROVIDER_SITE_OTHER): Payer: Medicare Other | Admitting: Physician Assistant

## 2019-07-07 ENCOUNTER — Encounter: Payer: Self-pay | Admitting: Physician Assistant

## 2019-07-07 DIAGNOSIS — M25561 Pain in right knee: Secondary | ICD-10-CM

## 2019-07-07 DIAGNOSIS — M1711 Unilateral primary osteoarthritis, right knee: Secondary | ICD-10-CM

## 2019-07-07 MED ORDER — HYALURONAN 88 MG/4ML IX SOSY
88.0000 mg | PREFILLED_SYRINGE | INTRA_ARTICULAR | Status: AC | PRN
  Administered 2019-07-07: 88 mg via INTRA_ARTICULAR

## 2019-07-07 NOTE — Progress Notes (Signed)
   Procedure Note  Patient: Patricia Travis             Date of Birth: 1951/05/21           MRN: [id number]             Visit Date: 07/07/2019 HPI: Mrs. Hanna comes in today for Monovisc injection right knee. She had no injury to the knee.  Cortisone injection she had in early July helped some.  Still having same.   Procedures: Visit Diagnoses:  1. Acute pain of right knee     Large Joint Inj: R knee on 07/07/2019 2:15 PM Indications: pain Details: 22 G 1.5 in needle, anterolateral approach  Arthrogram: No  Medications: 88 mg Hyaluronan 88 MG/4ML Outcome: tolerated well, no immediate complications Procedure, treatment alternatives, risks and benefits explained, specific risks discussed. Consent was given by the patient. Immediately prior to procedure a time out was called to verify the correct patient, procedure, equipment, support staff and site/side marked as required. Patient was prepped and draped in the usual sterile fashion.    Plan: Discussed with her she can have cortisone injection every 3 months and supplemental injections no more than every 6 months.  She will need to call to have the supplemental injection approval.  Should follow-up with [place] only as needed basis.

## 2019-08-24 DIAGNOSIS — J449 Chronic obstructive pulmonary disease, unspecified: Secondary | ICD-10-CM | POA: Diagnosis not present

## 2019-08-24 DIAGNOSIS — E7800 Pure hypercholesterolemia, unspecified: Secondary | ICD-10-CM | POA: Diagnosis not present

## 2019-08-24 DIAGNOSIS — I10 Essential (primary) hypertension: Secondary | ICD-10-CM | POA: Diagnosis not present

## 2019-08-24 DIAGNOSIS — E1165 Type 2 diabetes mellitus with hyperglycemia: Secondary | ICD-10-CM | POA: Diagnosis not present

## 2019-08-24 DIAGNOSIS — Z7984 Long term (current) use of oral hypoglycemic drugs: Secondary | ICD-10-CM | POA: Diagnosis not present

## 2019-08-24 DIAGNOSIS — E119 Type 2 diabetes mellitus without complications: Secondary | ICD-10-CM | POA: Diagnosis not present

## 2019-12-05 ENCOUNTER — Other Ambulatory Visit: Payer: Self-pay | Admitting: Family Medicine

## 2019-12-05 DIAGNOSIS — Z1231 Encounter for screening mammogram for malignant neoplasm of breast: Secondary | ICD-10-CM

## 2019-12-27 ENCOUNTER — Telehealth: Payer: Self-pay | Admitting: Orthopaedic Surgery

## 2019-12-27 NOTE — Telephone Encounter (Signed)
Patient called advised she want to get another gel injection. The number to contact patient is [phone]

## 2019-12-27 NOTE — Telephone Encounter (Signed)
Can we order her a right knee monovisc injection please

## 2019-12-28 ENCOUNTER — Ambulatory Visit
Admission: RE | Admit: 2019-12-28 | Discharge: 2019-12-28 | Disposition: A | Discharge status: Home / Self Care | Payer: Medicare Other | Source: Ambulatory Visit | Attending: Family Medicine | Admitting: Family Medicine

## 2019-12-28 DIAGNOSIS — Z1231 Encounter for screening mammogram for malignant neoplasm of breast: Secondary | ICD-10-CM

## 2019-12-29 ENCOUNTER — Telehealth: Payer: Self-pay

## 2019-12-29 NOTE — Telephone Encounter (Signed)
Error

## 2019-12-30 ENCOUNTER — Telehealth: Payer: Self-pay

## 2019-12-30 NOTE — Telephone Encounter (Signed)
Next available gel injection would need to be after 01/07/2020.

## 2019-12-30 NOTE — Telephone Encounter (Signed)
Submitted VOB for Monovisc, right knee. 

## 2019-12-30 NOTE — Telephone Encounter (Signed)
Noted  

## 2019-12-30 NOTE — Telephone Encounter (Signed)
Patient aware that she is approved for gel injection.  Approved for Monovisc, right knee. [employer] Bill Patient's secondary plan will pick up remaining expenses at 100% and it cover Medicare Part B deductible. No Co-pay No PA required  Appt. 01/10/2020 with Dr. Magnus Ivan

## 2020-01-10 ENCOUNTER — Other Ambulatory Visit: Payer: Self-pay

## 2020-01-10 ENCOUNTER — Encounter: Payer: Self-pay | Admitting: Orthopaedic Surgery

## 2020-01-10 ENCOUNTER — Ambulatory Visit (INDEPENDENT_AMBULATORY_CARE_PROVIDER_SITE_OTHER): Payer: Medicare Other | Admitting: Orthopaedic Surgery

## 2020-01-10 DIAGNOSIS — M1711 Unilateral primary osteoarthritis, right knee: Secondary | ICD-10-CM | POA: Diagnosis not present

## 2020-01-10 MED ORDER — HYALURONAN 88 MG/4ML IX SOSY
88.0000 mg | PREFILLED_SYRINGE | INTRA_ARTICULAR | Status: AC | PRN
  Administered 2020-01-10: 08:00:00 88 mg via INTRA_ARTICULAR

## 2020-01-10 NOTE — Progress Notes (Signed)
   Procedure Note  Patient: Patricia Travis             Date of Birth: 1951/09/03           MRN: [id number]             Visit Date: 01/10/2020  Procedures: Visit Diagnoses:  1. Unilateral primary osteoarthritis, right knee     Large Joint Inj: R knee on 01/10/2020 8:11 AM Indications: diagnostic evaluation and pain Details: 22 G 1.5 in needle, superolateral approach  Arthrogram: No  Medications: 88 mg Hyaluronan 88 MG/4ML Outcome: tolerated well, no immediate complications Procedure, treatment alternatives, risks and benefits explained, specific risks discussed. Consent was given by the patient. Immediately prior to procedure a time out was called to verify the correct patient, procedure, equipment, support staff and site/side marked as required. Patient was prepped and draped in the usual sterile fashion.    The patient is being seen today for right knee.  She has osteoarthritis in that right knee.  6 months ago she did have a successful hyaluronic acid injection with Monovisc in the right knee.  She has had no acute change in medical status and her knee is hurting again.  She would like to have a hyaluronic acid injection with Monovisc in the right knee again today.  She has had daily right knee pain.  On examination of her right knee there is no effusion.  She has pain along the medial joint line and at the patellofemoral joint with some crepitation.  There is no ligamentous instability of the knee and she has full range of motion of her right knee.  I did place Monovisc injection into the right knee today without difficulty.  All questions and concerns were answered and addressed.  She understands we can repeat this in 6 months if needed and if it still helping her.

## 2020-01-16 DIAGNOSIS — Z7984 Long term (current) use of oral hypoglycemic drugs: Secondary | ICD-10-CM | POA: Diagnosis not present

## 2020-01-16 DIAGNOSIS — E7800 Pure hypercholesterolemia, unspecified: Secondary | ICD-10-CM | POA: Diagnosis not present

## 2020-01-16 DIAGNOSIS — I10 Essential (primary) hypertension: Secondary | ICD-10-CM | POA: Diagnosis not present

## 2020-01-16 DIAGNOSIS — J449 Chronic obstructive pulmonary disease, unspecified: Secondary | ICD-10-CM | POA: Diagnosis not present

## 2020-01-16 DIAGNOSIS — E1165 Type 2 diabetes mellitus with hyperglycemia: Secondary | ICD-10-CM | POA: Diagnosis not present

## 2020-01-16 DIAGNOSIS — E119 Type 2 diabetes mellitus without complications: Secondary | ICD-10-CM | POA: Diagnosis not present

## 2020-01-24 ENCOUNTER — Telehealth: Payer: Self-pay | Admitting: *Deleted

## 2020-01-27 NOTE — Telephone Encounter (Signed)
Pt called states she had asked for refill of the meloxicam and had not heard from the doctor. I left a message informing pt she had not been seen in office in over a year and would need an appt if she was continuing to have foot pain.

## 2020-01-31 ENCOUNTER — Other Ambulatory Visit: Payer: Self-pay

## 2020-01-31 ENCOUNTER — Ambulatory Visit (INDEPENDENT_AMBULATORY_CARE_PROVIDER_SITE_OTHER): Payer: Medicare Other | Admitting: Podiatry

## 2020-01-31 DIAGNOSIS — M79671 Pain in right foot: Secondary | ICD-10-CM | POA: Diagnosis not present

## 2020-01-31 DIAGNOSIS — M722 Plantar fascial fibromatosis: Secondary | ICD-10-CM | POA: Diagnosis not present

## 2020-01-31 DIAGNOSIS — M659 Synovitis and tenosynovitis, unspecified: Secondary | ICD-10-CM | POA: Diagnosis not present

## 2020-01-31 DIAGNOSIS — M79672 Pain in left foot: Secondary | ICD-10-CM

## 2020-01-31 MED ORDER — MELOXICAM 15 MG PO TABS: 15.0000 mg | ORAL_TABLET | Freq: Every day | ORAL | 1 refills | Status: DC

## 2020-02-02 DIAGNOSIS — M79672 Pain in left foot: Secondary | ICD-10-CM | POA: Diagnosis not present

## 2020-02-02 DIAGNOSIS — M79671 Pain in right foot: Secondary | ICD-10-CM | POA: Diagnosis not present

## 2020-02-03 LAB — ARTHRITIS PANEL
Anti Nuclear Antibody (ANA): NEGATIVE
Rheumatoid fact SerPl-aCnc: <10 IU/mL (ref 0.0–13.9)
Sed Rate: 3 mm/hr (ref 0–40)
Uric Acid: 4.2 mg/dL (ref 3.0–7.2)

## 2020-02-03 NOTE — Progress Notes (Signed)
   Subjective: 69 year old female with PMHx of DM presenting today for follow up evaluation of bilateral foot and ankle pain. She reports sharp, throbbing pain that is exacerbated by walking. She has been taking Meloxicam as directed and is interested in pain management. Patient is here for further evaluation and treatment.   Past Medical History:  Diagnosis Date  . Anemia    hx- high school  . Asthma   . Complication of anesthesia    problem being combative awakening from  hysterectomy   79 but had total knee 08 and did fine  . COPD (chronic obstructive pulmonary disease) (HCC)   . Diabetes mellitus   . Dyspnea   . GERD (gastroesophageal reflux disease)    occ  . Headache(784.0)    migraines hx - couple/month  . Hyperlipidemia   . Hypertension   . Osteoarthritis    fingers, hands  . Osteopenia   . PONV (postoperative nausea and vomiting)      Objective: Physical Exam General: The patient is alert and oriented x3 in no acute distress.  Dermatology: Skin is warm, dry and supple bilateral lower extremities. Negative for open lesions or macerations bilateral.   Vascular: Dorsalis Pedis and Posterior Tibial pulses palpable bilateral.  Capillary fill time is immediate to all digits.  Neurological: Epicritic and protective threshold intact bilateral.   Musculoskeletal: Tenderness to palpation to the plantar aspect of the bilateral heels along the plantar fascia; to the anterior, medial and lateral aspects of the bilateral ankles; to the midfoot bilaterally. All other joints range of motion within normal limits bilateral. Strength 5/5 in all groups bilateral.    Assessment: 1. plantar fasciitis bilateral feet 2. Ankle synovitis bilateral  3. Midfoot capsulitis bilateral 4. Generalized foot pain bilateral  Plan of Care:  1. Patient evaluated. 2. Refill prescription for Meloxicam provided to patient.  3. Order placed for arthritic panel.  4. Declined injection today.  5.  Return to clinic as needed.    Felecia Shelling, DPM [facility]  Dr. Felecia Shelling, DPM    [address] [place], [place] [address]                Office [phone]  Fax [phone]

## 2020-02-03 NOTE — Progress Notes (Signed)
Patient contacted and discussed results over the phone. - Dr. Logan Bores

## 2020-02-24 DIAGNOSIS — E7800 Pure hypercholesterolemia, unspecified: Secondary | ICD-10-CM | POA: Diagnosis not present

## 2020-02-24 DIAGNOSIS — E1165 Type 2 diabetes mellitus with hyperglycemia: Secondary | ICD-10-CM | POA: Diagnosis not present

## 2020-02-24 DIAGNOSIS — Z7984 Long term (current) use of oral hypoglycemic drugs: Secondary | ICD-10-CM | POA: Diagnosis not present

## 2020-02-24 DIAGNOSIS — E119 Type 2 diabetes mellitus without complications: Secondary | ICD-10-CM | POA: Diagnosis not present

## 2020-02-24 DIAGNOSIS — I10 Essential (primary) hypertension: Secondary | ICD-10-CM | POA: Diagnosis not present

## 2020-02-24 DIAGNOSIS — J449 Chronic obstructive pulmonary disease, unspecified: Secondary | ICD-10-CM | POA: Diagnosis not present

## 2020-04-17 ENCOUNTER — Other Ambulatory Visit: Payer: Self-pay

## 2020-05-21 ENCOUNTER — Other Ambulatory Visit: Payer: Self-pay | Admitting: Podiatry

## 2020-05-23 DIAGNOSIS — I10 Essential (primary) hypertension: Secondary | ICD-10-CM | POA: Diagnosis not present

## 2020-05-23 DIAGNOSIS — E7800 Pure hypercholesterolemia, unspecified: Secondary | ICD-10-CM | POA: Diagnosis not present

## 2020-05-23 DIAGNOSIS — E119 Type 2 diabetes mellitus without complications: Secondary | ICD-10-CM | POA: Diagnosis not present

## 2020-05-23 DIAGNOSIS — E1165 Type 2 diabetes mellitus with hyperglycemia: Secondary | ICD-10-CM | POA: Diagnosis not present

## 2020-05-23 DIAGNOSIS — J449 Chronic obstructive pulmonary disease, unspecified: Secondary | ICD-10-CM | POA: Diagnosis not present

## 2020-06-21 DIAGNOSIS — E7800 Pure hypercholesterolemia, unspecified: Secondary | ICD-10-CM | POA: Diagnosis not present

## 2020-06-21 DIAGNOSIS — E1165 Type 2 diabetes mellitus with hyperglycemia: Secondary | ICD-10-CM | POA: Diagnosis not present

## 2020-06-21 DIAGNOSIS — E119 Type 2 diabetes mellitus without complications: Secondary | ICD-10-CM | POA: Diagnosis not present

## 2020-06-21 DIAGNOSIS — I10 Essential (primary) hypertension: Secondary | ICD-10-CM | POA: Diagnosis not present

## 2020-06-21 DIAGNOSIS — J449 Chronic obstructive pulmonary disease, unspecified: Secondary | ICD-10-CM | POA: Diagnosis not present

## 2020-06-25 DIAGNOSIS — E119 Type 2 diabetes mellitus without complications: Secondary | ICD-10-CM | POA: Diagnosis not present

## 2020-06-25 DIAGNOSIS — H0288B Meibomian gland dysfunction left eye, upper and lower eyelids: Secondary | ICD-10-CM | POA: Diagnosis not present

## 2020-06-25 DIAGNOSIS — H2513 Age-related nuclear cataract, bilateral: Secondary | ICD-10-CM | POA: Diagnosis not present

## 2020-06-25 DIAGNOSIS — H0288A Meibomian gland dysfunction right eye, upper and lower eyelids: Secondary | ICD-10-CM | POA: Diagnosis not present

## 2020-07-12 ENCOUNTER — Telehealth: Payer: Self-pay | Admitting: Orthopaedic Surgery

## 2020-07-12 ENCOUNTER — Telehealth: Payer: Self-pay

## 2020-07-12 NOTE — Telephone Encounter (Signed)
Submitted VOB, Monovisc, right knee. 

## 2020-07-12 NOTE — Telephone Encounter (Signed)
Patient called asked if she can get the gel injection in her right knee?  The number to contact patient is [phone]

## 2020-07-12 NOTE — Telephone Encounter (Signed)
Noted  

## 2020-07-12 NOTE — Telephone Encounter (Signed)
Can we order? 

## 2020-07-13 ENCOUNTER — Telehealth: Payer: Self-pay

## 2020-07-13 NOTE — Telephone Encounter (Signed)
Approved, Monovisc, right knee. [place] deductible has been met Patient will be responsible for 20% OOP. No Co-pay No PA required

## 2020-07-18 ENCOUNTER — Ambulatory Visit (INDEPENDENT_AMBULATORY_CARE_PROVIDER_SITE_OTHER): Payer: Medicare Other | Admitting: Orthopaedic Surgery

## 2020-07-18 ENCOUNTER — Encounter: Payer: Self-pay | Admitting: Orthopaedic Surgery

## 2020-07-18 DIAGNOSIS — M1711 Unilateral primary osteoarthritis, right knee: Secondary | ICD-10-CM | POA: Diagnosis not present

## 2020-07-18 MED ORDER — HYALURONAN 88 MG/4ML IX SOSY
88.0000 mg | PREFILLED_SYRINGE | INTRA_ARTICULAR | Status: AC | PRN
  Administered 2020-07-18: 88 mg via INTRA_ARTICULAR

## 2020-07-18 NOTE — Progress Notes (Signed)
° °  Procedure Note  Patient: Patricia Travis             Date of Birth: [birth year]           MRN: [id number]             Visit Date: 07/18/2020  Procedures: Visit Diagnoses:  1. Unilateral primary osteoarthritis, right knee     Large Joint Inj on 07/18/2020 8:45 AM Indications: diagnostic evaluation and pain Details: 22 G 1.5 in needle, superolateral approach  Arthrogram: No  Medications: 88 mg Hyaluronan 88 MG/4ML Outcome: tolerated well, no immediate complications Procedure, treatment alternatives, risks and benefits explained, specific risks discussed. Consent was given by the patient. Immediately prior to procedure a time out was called to verify the correct patient, procedure, equipment, support staff and site/side marked as required. Patient was prepped and draped in the usual sterile fashion.    The patient comes in today for scheduled hyaluronic acid injection in her right knee to treat the pain from osteoarthritis.  These injections have worked for her in the past and she is doing well with them.  She has tried failed other conservative treatment measures including steroid injections for the right knee.  On examination of her right knee today there is no effusion but a painful medial joint line and patellofemoral joint.  The knee is ligamentously stable with good range of motion.  I did place Monovisc in her right knee today without difficulty.  All questions and concerns were answered addressed.  She knows to wait at least a minimum of 6 months between injections and only if needed.

## 2020-08-20 DIAGNOSIS — E1165 Type 2 diabetes mellitus with hyperglycemia: Secondary | ICD-10-CM | POA: Diagnosis not present

## 2020-08-20 DIAGNOSIS — I10 Essential (primary) hypertension: Secondary | ICD-10-CM | POA: Diagnosis not present

## 2020-08-20 DIAGNOSIS — E7800 Pure hypercholesterolemia, unspecified: Secondary | ICD-10-CM | POA: Diagnosis not present

## 2020-08-20 DIAGNOSIS — G8929 Other chronic pain: Secondary | ICD-10-CM | POA: Diagnosis not present

## 2020-08-20 DIAGNOSIS — E119 Type 2 diabetes mellitus without complications: Secondary | ICD-10-CM | POA: Diagnosis not present

## 2020-08-20 DIAGNOSIS — J449 Chronic obstructive pulmonary disease, unspecified: Secondary | ICD-10-CM | POA: Diagnosis not present

## 2020-08-22 DIAGNOSIS — Z23 Encounter for immunization: Secondary | ICD-10-CM | POA: Diagnosis not present

## 2020-09-14 ENCOUNTER — Other Ambulatory Visit: Payer: Self-pay | Admitting: Podiatry

## 2020-09-17 DIAGNOSIS — E119 Type 2 diabetes mellitus without complications: Secondary | ICD-10-CM | POA: Diagnosis not present

## 2020-09-17 DIAGNOSIS — G8929 Other chronic pain: Secondary | ICD-10-CM | POA: Diagnosis not present

## 2020-09-17 DIAGNOSIS — E1165 Type 2 diabetes mellitus with hyperglycemia: Secondary | ICD-10-CM | POA: Diagnosis not present

## 2020-09-17 DIAGNOSIS — J449 Chronic obstructive pulmonary disease, unspecified: Secondary | ICD-10-CM | POA: Diagnosis not present

## 2020-09-17 DIAGNOSIS — I10 Essential (primary) hypertension: Secondary | ICD-10-CM | POA: Diagnosis not present

## 2020-09-17 DIAGNOSIS — E7800 Pure hypercholesterolemia, unspecified: Secondary | ICD-10-CM | POA: Diagnosis not present

## 2020-09-25 DIAGNOSIS — G43909 Migraine, unspecified, not intractable, without status migrainosus: Secondary | ICD-10-CM | POA: Diagnosis not present

## 2020-09-25 DIAGNOSIS — I10 Essential (primary) hypertension: Secondary | ICD-10-CM | POA: Diagnosis not present

## 2020-09-25 DIAGNOSIS — E7800 Pure hypercholesterolemia, unspecified: Secondary | ICD-10-CM | POA: Diagnosis not present

## 2020-09-25 DIAGNOSIS — Z23 Encounter for immunization: Secondary | ICD-10-CM | POA: Diagnosis not present

## 2020-09-25 DIAGNOSIS — E119 Type 2 diabetes mellitus without complications: Secondary | ICD-10-CM | POA: Diagnosis not present

## 2020-09-25 DIAGNOSIS — G8929 Other chronic pain: Secondary | ICD-10-CM | POA: Diagnosis not present

## 2020-09-25 DIAGNOSIS — J449 Chronic obstructive pulmonary disease, unspecified: Secondary | ICD-10-CM | POA: Diagnosis not present

## 2020-10-17 DIAGNOSIS — E1165 Type 2 diabetes mellitus with hyperglycemia: Secondary | ICD-10-CM | POA: Diagnosis not present

## 2020-10-17 DIAGNOSIS — E119 Type 2 diabetes mellitus without complications: Secondary | ICD-10-CM | POA: Diagnosis not present

## 2020-10-17 DIAGNOSIS — G8929 Other chronic pain: Secondary | ICD-10-CM | POA: Diagnosis not present

## 2020-10-17 DIAGNOSIS — E7800 Pure hypercholesterolemia, unspecified: Secondary | ICD-10-CM | POA: Diagnosis not present

## 2020-10-17 DIAGNOSIS — I10 Essential (primary) hypertension: Secondary | ICD-10-CM | POA: Diagnosis not present

## 2020-10-17 DIAGNOSIS — K219 Gastro-esophageal reflux disease without esophagitis: Secondary | ICD-10-CM | POA: Diagnosis not present

## 2020-10-17 DIAGNOSIS — J449 Chronic obstructive pulmonary disease, unspecified: Secondary | ICD-10-CM | POA: Diagnosis not present

## 2020-11-22 ENCOUNTER — Other Ambulatory Visit: Payer: Self-pay | Admitting: Family Medicine

## 2020-11-22 DIAGNOSIS — Z1231 Encounter for screening mammogram for malignant neoplasm of breast: Secondary | ICD-10-CM

## 2020-11-30 ENCOUNTER — Telehealth: Payer: Self-pay

## 2020-11-30 NOTE — Telephone Encounter (Signed)
Submitted for VOB for Monovisc-Right knee 

## 2020-11-30 NOTE — Telephone Encounter (Signed)
Noted went ahead and called pt and scheduled her for 01/21/21-43months and a day from last injection. Will work on [employer]

## 2020-11-30 NOTE — Telephone Encounter (Signed)
Patient called she would like to start approval process for monovisc injection [phone]

## 2020-12-17 ENCOUNTER — Telehealth: Payer: Self-pay

## 2020-12-17 NOTE — Telephone Encounter (Signed)
If pt prefers [employer] cover the 20% after [employer]

## 2020-12-17 NOTE — Telephone Encounter (Signed)
Approved for SynviscONe-Right knee Buy and Bill Dr. Magnus Ivan No copay Covered @ 100%-[employer] to pick up cost after medicare No prior auth required

## 2020-12-28 ENCOUNTER — Ambulatory Visit
Admission: RE | Admit: 2020-12-28 | Discharge: 2020-12-28 | Disposition: A | Discharge status: Home / Self Care | Payer: Medicare Other | Source: Ambulatory Visit | Attending: Family Medicine | Admitting: Family Medicine

## 2020-12-28 ENCOUNTER — Other Ambulatory Visit: Payer: Self-pay

## 2020-12-28 DIAGNOSIS — Z1231 Encounter for screening mammogram for malignant neoplasm of breast: Secondary | ICD-10-CM

## 2021-01-05 ENCOUNTER — Other Ambulatory Visit: Payer: Self-pay | Admitting: Podiatry

## 2021-01-07 NOTE — Telephone Encounter (Signed)
Please advise 

## 2021-01-10 DIAGNOSIS — E785 Hyperlipidemia, unspecified: Secondary | ICD-10-CM | POA: Diagnosis not present

## 2021-01-10 DIAGNOSIS — G8929 Other chronic pain: Secondary | ICD-10-CM | POA: Diagnosis not present

## 2021-01-10 DIAGNOSIS — I10 Essential (primary) hypertension: Secondary | ICD-10-CM | POA: Diagnosis not present

## 2021-01-10 DIAGNOSIS — K219 Gastro-esophageal reflux disease without esophagitis: Secondary | ICD-10-CM | POA: Diagnosis not present

## 2021-01-10 DIAGNOSIS — J449 Chronic obstructive pulmonary disease, unspecified: Secondary | ICD-10-CM | POA: Diagnosis not present

## 2021-01-10 DIAGNOSIS — E7800 Pure hypercholesterolemia, unspecified: Secondary | ICD-10-CM | POA: Diagnosis not present

## 2021-01-10 DIAGNOSIS — E119 Type 2 diabetes mellitus without complications: Secondary | ICD-10-CM | POA: Diagnosis not present

## 2021-01-10 DIAGNOSIS — E1165 Type 2 diabetes mellitus with hyperglycemia: Secondary | ICD-10-CM | POA: Diagnosis not present

## 2021-01-21 ENCOUNTER — Encounter: Payer: Self-pay | Admitting: Orthopaedic Surgery

## 2021-01-21 ENCOUNTER — Ambulatory Visit (INDEPENDENT_AMBULATORY_CARE_PROVIDER_SITE_OTHER): Payer: Medicare Other | Admitting: Orthopaedic Surgery

## 2021-01-21 DIAGNOSIS — M1711 Unilateral primary osteoarthritis, right knee: Secondary | ICD-10-CM | POA: Diagnosis not present

## 2021-01-21 MED ORDER — HYLAN G-F 20 48 MG/6ML IX SOSY
48.0000 mg | PREFILLED_SYRINGE | INTRA_ARTICULAR | Status: AC | PRN
  Administered 2021-01-21: 48 mg via INTRA_ARTICULAR

## 2021-01-21 NOTE — Progress Notes (Signed)
   Procedure Note  Patient: Patricia Travis             Date of Birth: 09-Jul-1951           MRN: [id number]             Visit Date: 01/21/2021  Procedures: Visit Diagnoses:  1. Unilateral primary osteoarthritis, right knee     Large Joint Inj: R knee on 01/21/2021 8:32 AM Indications: pain and diagnostic evaluation Details: 22 G 1.5 in needle, superolateral approach  Arthrogram: No  Medications: 48 mg Hylan 48 MG/6ML Outcome: tolerated well, no immediate complications Procedure, treatment alternatives, risks and benefits explained, specific risks discussed. Consent was given by the patient. Immediately prior to procedure a time out was called to verify the correct patient, procedure, equipment, support staff and site/side marked as required. Patient was prepped and draped in the usual sterile fashion.    The patient comes in today for scheduled hyaluronic acid injection with Synvisc 1 in her right knee to treat the pain from osteoarthritis.  She is 70 years old.  She has been dealing with arthritis pain for some time.  She has tried and failed other conservative treatment measures including steroid injections.  There is no effusion with her right knee today.  I placed Synvisc 1 without difficulty.  All questions and concerns were answered and addressed.  She does have a history of a left total knee replacement that was done in 2008 in [facility].  That knee is giving her a lot of problems.  I would like to see her back in 3 months to see how she is doing overall but we can have an AP and lateral of the left total knee at that visit for further evaluation of this.

## 2021-02-18 IMAGING — MG MM DIGITAL SCREENING BILAT W/ TOMO AND CAD
6 of 10 series · 6 of 30 positions shown · non-contrast
Comparison: Previous exam(s).

CLINICAL DATA: Screening.

EXAM:
DIGITAL SCREENING BILATERAL MAMMOGRAM WITH TOMOSYNTHESIS AND CAD
TECHNIQUE: Bilateral screening digital craniocaudal and mediolateral oblique
mammograms were obtained. Bilateral screening digital breast
tomosynthesis was performed. The images were evaluated with
computer-aided detection.

[L CC synth-2D]
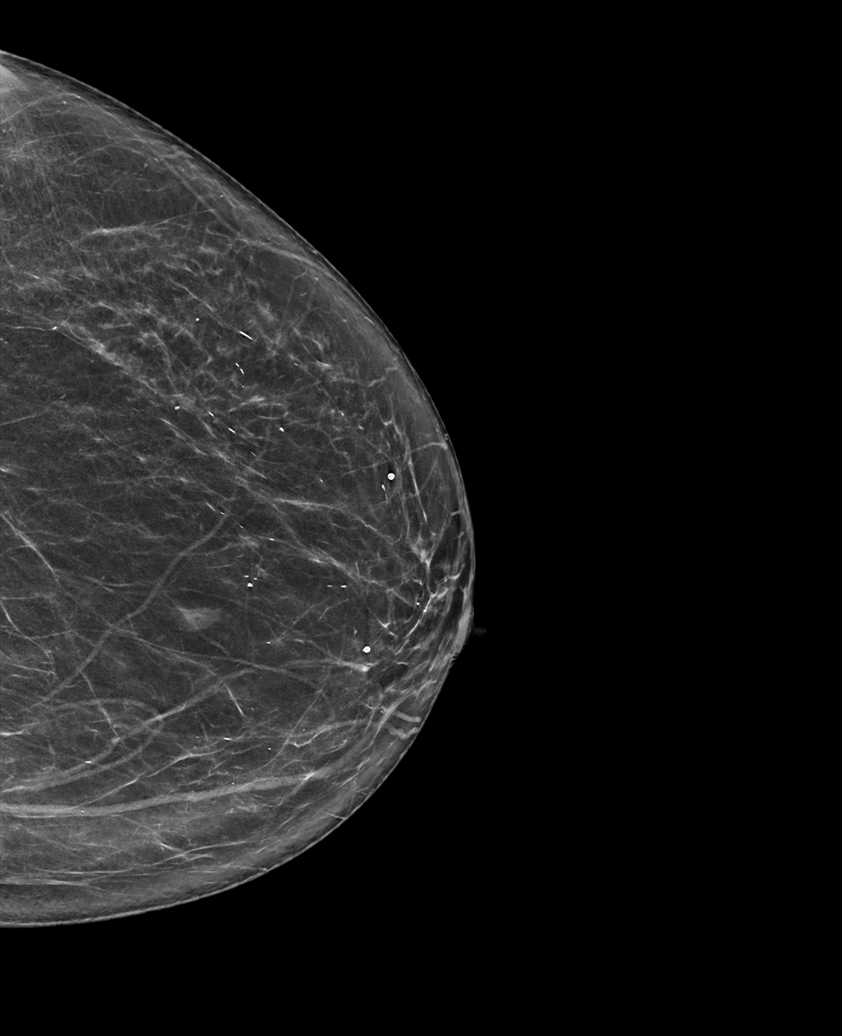

[L MLO synth-2D]
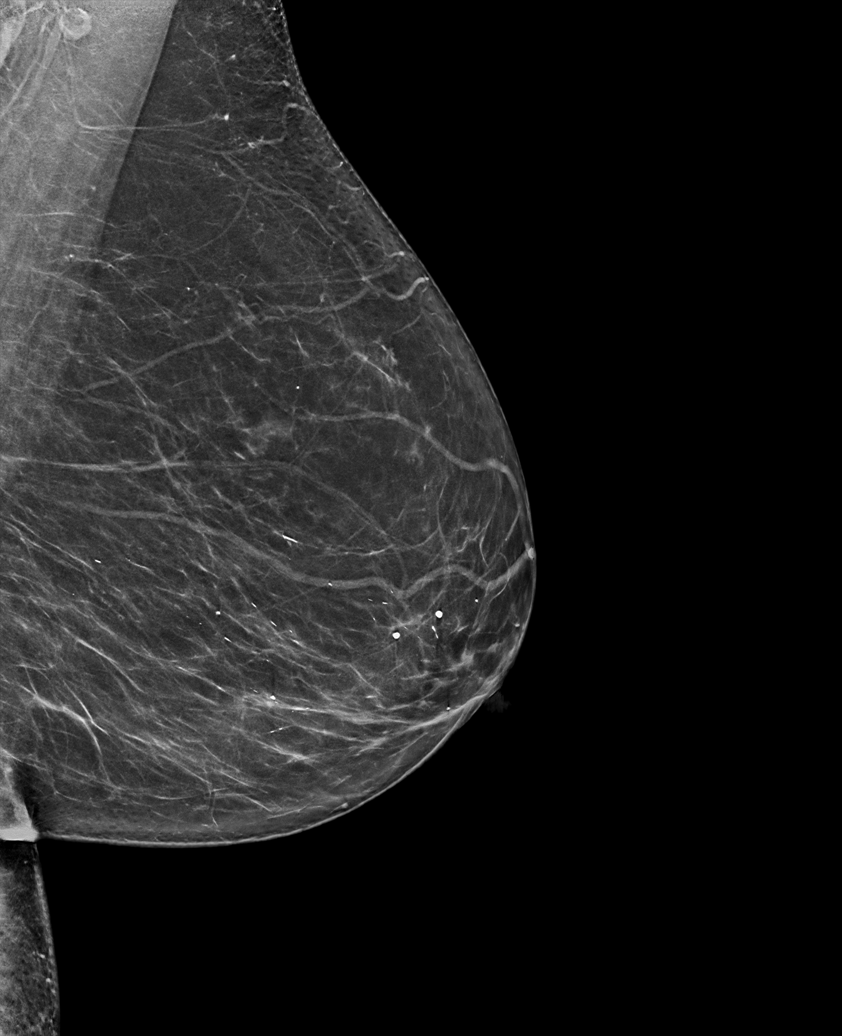

[R CC synth-2D]
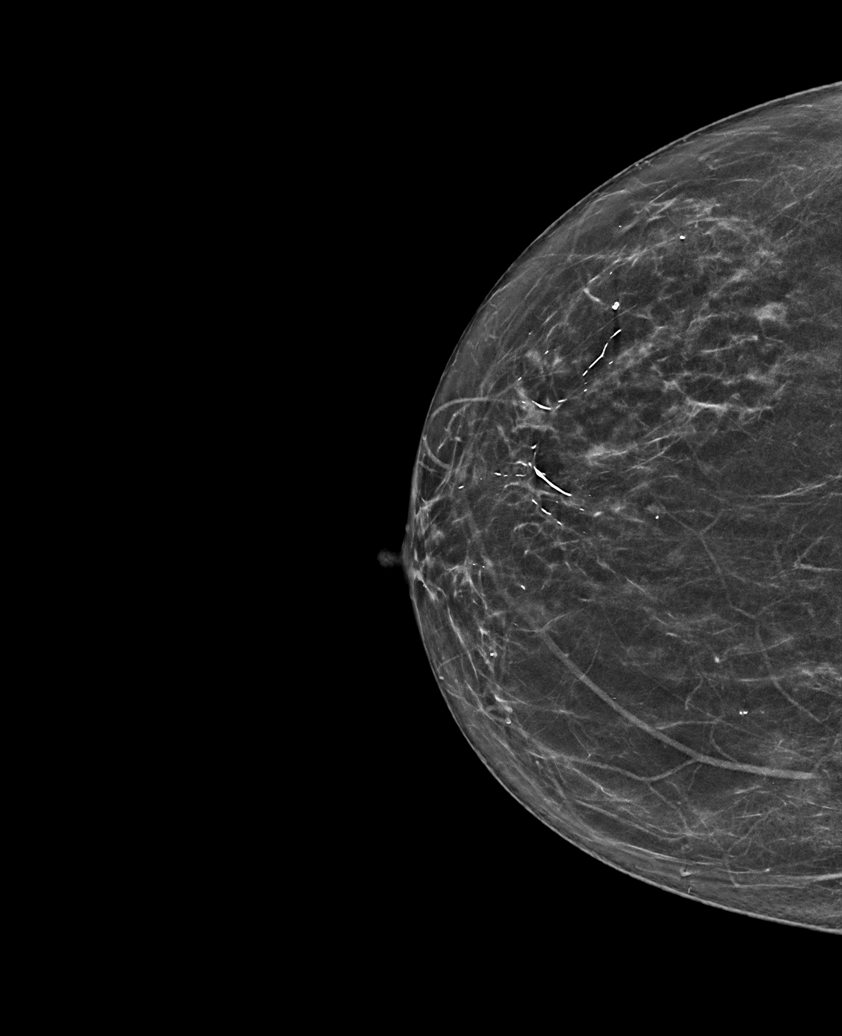

[R MLO synth-2D (1 of 2)]
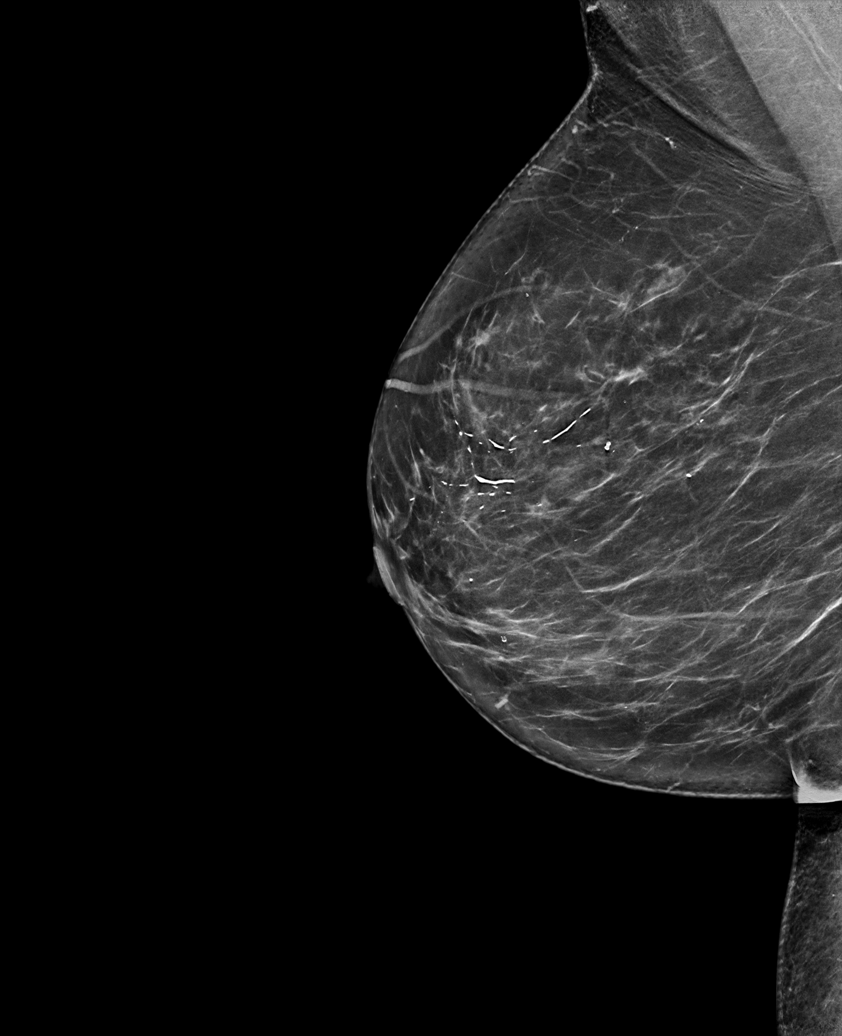

[R MLO synth-2D (2 of 2)]
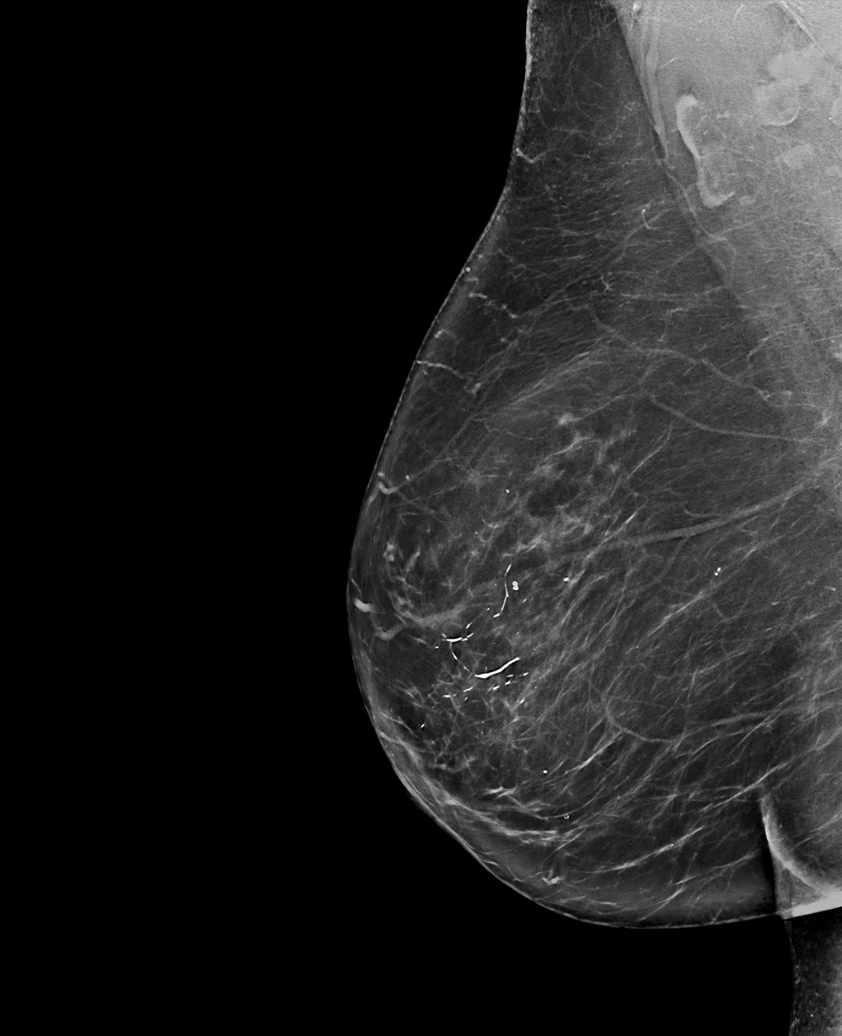

[L CC tomo · tomo slice 36/71.0]
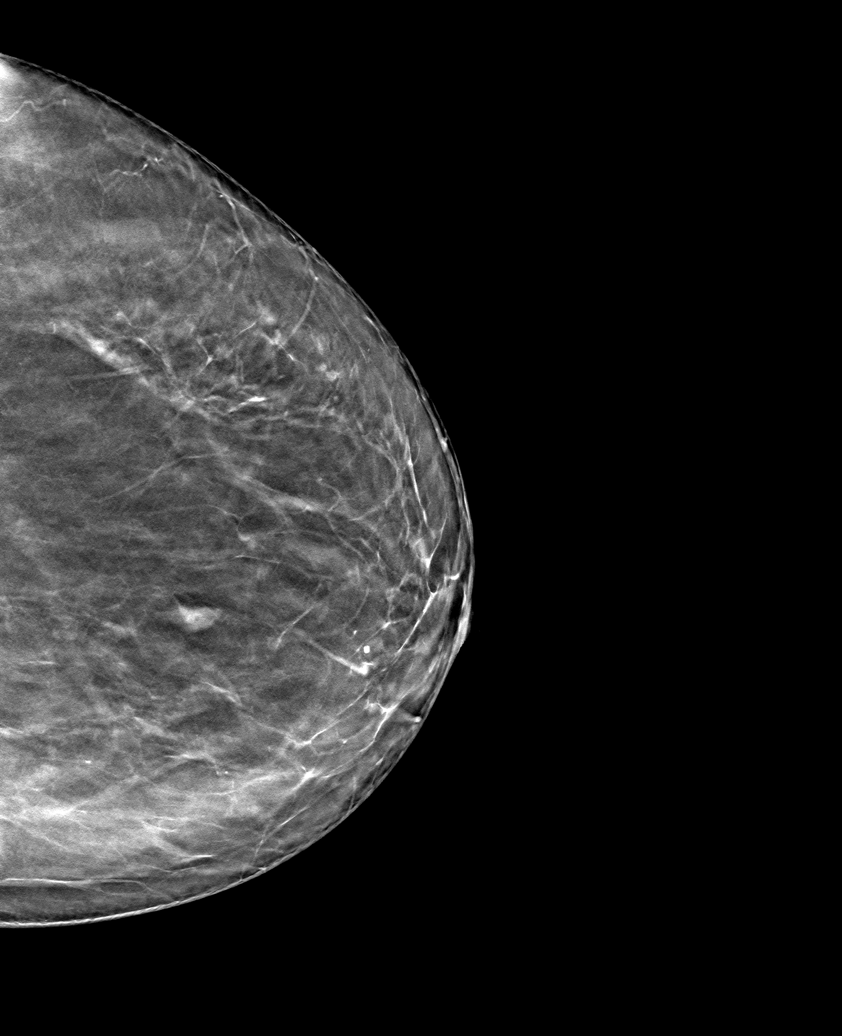

[6 of 30 positions shown; findings below may reference images not displayed]

ACR Breast Density Category b: There are scattered areas of
fibroglandular density.
FINDINGS: There are no findings suspicious for malignancy.
IMPRESSION: No mammographic evidence of malignancy. A result letter of this
screening mammogram will be mailed directly to the patient.

RECOMMENDATION:
Screening mammogram in one year. (Code:51-O-LD2)

BI-RADS CATEGORY  1: Negative.

## 2021-02-19 DIAGNOSIS — E785 Hyperlipidemia, unspecified: Secondary | ICD-10-CM | POA: Diagnosis not present

## 2021-02-19 DIAGNOSIS — E7800 Pure hypercholesterolemia, unspecified: Secondary | ICD-10-CM | POA: Diagnosis not present

## 2021-02-19 DIAGNOSIS — J449 Chronic obstructive pulmonary disease, unspecified: Secondary | ICD-10-CM | POA: Diagnosis not present

## 2021-02-19 DIAGNOSIS — I10 Essential (primary) hypertension: Secondary | ICD-10-CM | POA: Diagnosis not present

## 2021-02-19 DIAGNOSIS — K219 Gastro-esophageal reflux disease without esophagitis: Secondary | ICD-10-CM | POA: Diagnosis not present

## 2021-02-19 DIAGNOSIS — E119 Type 2 diabetes mellitus without complications: Secondary | ICD-10-CM | POA: Diagnosis not present

## 2021-02-19 DIAGNOSIS — G8929 Other chronic pain: Secondary | ICD-10-CM | POA: Diagnosis not present

## 2021-02-19 DIAGNOSIS — E1165 Type 2 diabetes mellitus with hyperglycemia: Secondary | ICD-10-CM | POA: Diagnosis not present

## 2021-03-11 DIAGNOSIS — J449 Chronic obstructive pulmonary disease, unspecified: Secondary | ICD-10-CM | POA: Diagnosis not present

## 2021-03-11 DIAGNOSIS — E785 Hyperlipidemia, unspecified: Secondary | ICD-10-CM | POA: Diagnosis not present

## 2021-03-11 DIAGNOSIS — E1165 Type 2 diabetes mellitus with hyperglycemia: Secondary | ICD-10-CM | POA: Diagnosis not present

## 2021-03-11 DIAGNOSIS — E7800 Pure hypercholesterolemia, unspecified: Secondary | ICD-10-CM | POA: Diagnosis not present

## 2021-03-11 DIAGNOSIS — K219 Gastro-esophageal reflux disease without esophagitis: Secondary | ICD-10-CM | POA: Diagnosis not present

## 2021-03-11 DIAGNOSIS — E119 Type 2 diabetes mellitus without complications: Secondary | ICD-10-CM | POA: Diagnosis not present

## 2021-03-11 DIAGNOSIS — G8929 Other chronic pain: Secondary | ICD-10-CM | POA: Diagnosis not present

## 2021-03-11 DIAGNOSIS — I10 Essential (primary) hypertension: Secondary | ICD-10-CM | POA: Diagnosis not present

## 2021-04-19 DIAGNOSIS — E785 Hyperlipidemia, unspecified: Secondary | ICD-10-CM | POA: Diagnosis not present

## 2021-04-19 DIAGNOSIS — E7800 Pure hypercholesterolemia, unspecified: Secondary | ICD-10-CM | POA: Diagnosis not present

## 2021-04-19 DIAGNOSIS — E119 Type 2 diabetes mellitus without complications: Secondary | ICD-10-CM | POA: Diagnosis not present

## 2021-04-19 DIAGNOSIS — I10 Essential (primary) hypertension: Secondary | ICD-10-CM | POA: Diagnosis not present

## 2021-04-19 DIAGNOSIS — J449 Chronic obstructive pulmonary disease, unspecified: Secondary | ICD-10-CM | POA: Diagnosis not present

## 2021-04-19 DIAGNOSIS — K219 Gastro-esophageal reflux disease without esophagitis: Secondary | ICD-10-CM | POA: Diagnosis not present

## 2021-04-19 DIAGNOSIS — E1165 Type 2 diabetes mellitus with hyperglycemia: Secondary | ICD-10-CM | POA: Diagnosis not present

## 2021-04-19 DIAGNOSIS — G8929 Other chronic pain: Secondary | ICD-10-CM | POA: Diagnosis not present

## 2021-04-23 ENCOUNTER — Encounter: Payer: Self-pay | Admitting: Orthopaedic Surgery

## 2021-04-23 ENCOUNTER — Ambulatory Visit (INDEPENDENT_AMBULATORY_CARE_PROVIDER_SITE_OTHER): Payer: Medicare Other

## 2021-04-23 ENCOUNTER — Ambulatory Visit (INDEPENDENT_AMBULATORY_CARE_PROVIDER_SITE_OTHER): Payer: Medicare Other | Admitting: Orthopaedic Surgery

## 2021-04-23 DIAGNOSIS — M25562 Pain in left knee: Secondary | ICD-10-CM

## 2021-04-23 NOTE — Addendum Note (Signed)
Addended by: Barbette Or on: 04/23/2021 10:19 AM   Modules accepted: Orders

## 2021-04-23 NOTE — Progress Notes (Signed)
Office Visit Note   Patient: Patricia Travis           Date of Birth: Jun 12, 1951           MRN: [id number] Visit Date: 04/23/2021              Requested by: Patricia Heron, MD [address] [place],  [place] [address] PCP: Patricia Heron, MD   Assessment & Plan: Visit Diagnoses:  1. Left knee pain, unspecified chronicity     Plan: We will obtain a bone scan to rule out loosening left total knee arthroplasty components.  Have her follow-up after the bone scan to go over the results and discuss further treatment.  She also will try to obtain her operative note so we can identify which manufacture which she used.  Questions encouraged and answered at length. In regards to the right knee she will follow-up with [place] approximately 3 months for repeat Synvisc 1 injection. Follow-Up Instructions: Return After bone scan.   Orders:  Orders Placed This Encounter  Procedures  . XR Knee 1-2 Views Left   No orders of the defined types were placed in this encounter.     Procedures: No procedures performed   Clinical Data: No additional findings.   Subjective: Chief Complaint  Patient presents with  . Right Knee - Follow-up  . Left Knee - Pain    HPI Patricia Travis returns today 3 months status post right knee Synvisc 1 injection.  She states her right knee overall is doing well.  She is happy with the results she got from the injection.  She continues to have left knee pain.  She had left total knee arthroplasty performed in [place] in 2008.  States she has had some discomfort in the knee since his surgery and swelling.  However she is now having giving way. Review of Systems  Constitutional: Negative for chills and fever.     Objective: Vital Signs: There were no vitals taken for this visit.  Physical Exam Constitutional:      Appearance: She is not ill-appearing or diaphoretic.  Pulmonary:     Effort: Pulmonary effort is normal.  Neurological:      Mental Status: She is alert and oriented to person, place, and time.  Psychiatric:        Mood and Affect: Mood normal.     Ortho Exam Right knee good range of motion without pain.  Left knee tenderness along medial joint line.  Slight opening with varus stressing.  Anterior drawer reveals slight laxity of the left knee.  Left knee full extension flexion to 110 degrees.  No abnormal warmth erythema or effusion of either knee.  Left knee surgical incisions well-healed. Specialty Comments:  No specialty comments available.  Imaging: XR Knee 1-2 Views Left  Result Date: 04/23/2021 Left knee AP lateral views: No acute fracture.  Left total knee arthroplasty component appear well-seated.  No bony abnormalities otherwise.    PMFS History: Patient Active Problem List   Diagnosis Date Noted  . Unilateral primary osteoarthritis, right knee 01/10/2020  . Left knee pain 12/31/2015  . Chronic cholecystitis 08/05/2012  . Left arm pain 09/25/2011  . Left arm numbness 09/25/2011  . Tinnitus 09/12/2011  . MICROALBUMINURIA 01/03/2011  . VITAMIN B12 DEFICIENCY 03/13/2010  . PERIPHERAL NEUROPATHY 03/12/2010  . COPD, MILD 08/23/2009  . CARPAL TUNNEL SYNDROME, LEFT 05/08/2009  . ARTHRITIS, CARPOMETACARPAL JOINT 05/08/2009  . DIABETES MELLITUS, TYPE II 08/02/2008  . HYPERLIPIDEMIA  02/07/2007  . MIGRAINE, CLASSICAL W/O INTRACTABLE MIGRAINE 02/07/2007  . HYPERTENSION 02/07/2007  . GERD 02/07/2007  . OSTEOARTHRITIS 02/07/2007  . KNEE PAIN, LEFT, CHRONIC 02/07/2007  . OSTEOPENIA 02/07/2007   Past Medical History:  Diagnosis Date  . Anemia    hx- high school  . Asthma   . Complication of anesthesia    problem being combative awakening from  hysterectomy   79 but had total knee 08 and did fine  . COPD (chronic obstructive pulmonary disease) (HCC)   . Diabetes mellitus   . Dyspnea   . GERD (gastroesophageal reflux disease)    occ  . Headache(784.0)    migraines hx - couple/month  .  Hyperlipidemia   . Hypertension   . Osteoarthritis    fingers, hands  . Osteopenia   . PONV (postoperative nausea and vomiting)     Family History  Problem Relation Age of Onset  . Heart attack Father   . Coronary artery disease Father   . Heart disease Father   . Arthritis Mother        RA, and osteoarthritis  . Cancer Maternal Grandmother        breast  . Breast cancer Maternal Grandmother 50  . Breast cancer Maternal Aunt 70  . Diabetes Sister   . Diabetes Other        grandparents  . Cancer Paternal Grandfather     Past Surgical History:  Procedure Laterality Date  . ABDOMINAL HYSTERECTOMY     total  . CHOLECYSTECTOMY  08/16/2012   Procedure: LAPAROSCOPIC CHOLECYSTECTOMY WITH INTRAOPERATIVE CHOLANGIOGRAM;  Surgeon: Wilmon Arms. Corliss Skains, MD;  Location: [facility] OR;  Service: General;  Laterality: N/A;  . KNEE ARTHROSCOPY  1990   L  . KNEE CARTILAGE SURGERY  1992   L  . OOPHORECTOMY    . TOTAL KNEE ARTHROPLASTY Left 06/01/2007   Social History   Occupational History  . Occupation: Civil Service fast streamer: [employer]    Comment: Accounts Payable  Tobacco Use  . Smoking status: Former Smoker    Packs/day: 1.50    Years: 25.00    Pack years: 14.50    Quit date: 09/07/1992    Years since quitting: 28.6  . Smokeless tobacco: Never Used  Vaping Use  . Vaping Use: Never used  Substance and Sexual Activity  . Alcohol use: No  . Drug use: No  . Sexual activity: Not on file

## 2021-05-06 ENCOUNTER — Telehealth: Payer: Self-pay | Admitting: Orthopaedic Surgery

## 2021-05-06 NOTE — Telephone Encounter (Signed)
Patient aware of the below message  

## 2021-05-06 NOTE — Telephone Encounter (Signed)
Pt called and she is wondering if she can get an IV that puts her to sleep for her bone scan which is on (05/07/2021) She states she has had it before and it the only thing that work for her.   CB [phone]

## 2021-05-07 ENCOUNTER — Encounter (HOSPITAL_COMMUNITY)
Admission: RE | Admit: 2021-05-07 | Discharge: 2021-05-07 | Disposition: A | Discharge status: Home / Self Care | Payer: Medicare Other | Source: Ambulatory Visit | Attending: Orthopaedic Surgery | Admitting: Orthopaedic Surgery

## 2021-05-07 ENCOUNTER — Other Ambulatory Visit: Payer: Self-pay

## 2021-05-07 DIAGNOSIS — M7989 Other specified soft tissue disorders: Secondary | ICD-10-CM | POA: Diagnosis not present

## 2021-05-07 DIAGNOSIS — M25562 Pain in left knee: Secondary | ICD-10-CM | POA: Insufficient documentation

## 2021-05-07 MED ORDER — TECHNETIUM TC 99M MEDRONATE IV KIT
21.2000 | PACK | Freq: Once | INTRAVENOUS | Status: AC | PRN
  Administered 2021-05-07: 21.2 via INTRAVENOUS

## 2021-05-22 ENCOUNTER — Telehealth: Payer: Self-pay | Admitting: Orthopaedic Surgery

## 2021-05-22 NOTE — Telephone Encounter (Signed)
Patient called needing to know the results of her test. The number to contact patient is [phone]

## 2021-06-03 ENCOUNTER — Other Ambulatory Visit: Payer: Self-pay

## 2021-06-03 ENCOUNTER — Encounter: Payer: Self-pay | Admitting: Orthopaedic Surgery

## 2021-06-03 ENCOUNTER — Ambulatory Visit (INDEPENDENT_AMBULATORY_CARE_PROVIDER_SITE_OTHER): Payer: Medicare Other | Admitting: Orthopaedic Surgery

## 2021-06-03 DIAGNOSIS — T84033D Mechanical loosening of internal left knee prosthetic joint, subsequent encounter: Secondary | ICD-10-CM

## 2021-06-03 DIAGNOSIS — T84063D Wear of articular bearing surface of internal prosthetic left knee joint, subsequent encounter: Secondary | ICD-10-CM | POA: Diagnosis not present

## 2021-06-03 NOTE — Progress Notes (Signed)
The patient is returning for follow-up after having a three-phase bone scan to rule out prosthetic loosening of a left total knee arthroplasty.  Her left knee was placed in 2008 elsewhere.  She has always had swelling since then.  She does have a dull aching pain in that knee that is constant.  It is worse with weightbearing.  There is also a burning type of sensation.  Plain films of the knee were inconclusive and it showed well-seated implants.  A three-phase bone scan is warranted at this standpoint.  There is currently listed no fever, chills, nausea, vomiting.  She has had no other acute change in her medical status.  The three-phase bone scan shows increased uptake around the tibial component medial and lateral.  This is suggestive of aseptic loosening.  On examination of her left knee is slightly warm but not red.  There is a mild effusion.  She has pretty good range of motion.  The knee feels stable.  I had a long and thorough discussion about what this means.  Our recommendation would be revision of the knee arthroplasty with hopefully potentially just revision of the tibial component but she have to be prepared for revision of the entire knee prosthesis.  I showed her knee model and explained in detail what the surgery involves.  We discussed the risks and benefits of surgery.  She understands this would be a more complicated surgery that could take 2 to 3 hours or more.  She has her surgery scheduler's card and will let us know if she would like to have this scheduled.  All questions and concerns were answered and addressed.

## 2021-06-05 DIAGNOSIS — E119 Type 2 diabetes mellitus without complications: Secondary | ICD-10-CM | POA: Diagnosis not present

## 2021-06-05 DIAGNOSIS — E1165 Type 2 diabetes mellitus with hyperglycemia: Secondary | ICD-10-CM | POA: Diagnosis not present

## 2021-06-05 DIAGNOSIS — E1169 Type 2 diabetes mellitus with other specified complication: Secondary | ICD-10-CM | POA: Diagnosis not present

## 2021-06-05 DIAGNOSIS — I10 Essential (primary) hypertension: Secondary | ICD-10-CM | POA: Diagnosis not present

## 2021-06-05 DIAGNOSIS — J449 Chronic obstructive pulmonary disease, unspecified: Secondary | ICD-10-CM | POA: Diagnosis not present

## 2021-06-05 DIAGNOSIS — E7800 Pure hypercholesterolemia, unspecified: Secondary | ICD-10-CM | POA: Diagnosis not present

## 2021-06-05 DIAGNOSIS — K219 Gastro-esophageal reflux disease without esophagitis: Secondary | ICD-10-CM | POA: Diagnosis not present

## 2021-06-05 DIAGNOSIS — G8929 Other chronic pain: Secondary | ICD-10-CM | POA: Diagnosis not present

## 2021-06-05 DIAGNOSIS — E785 Hyperlipidemia, unspecified: Secondary | ICD-10-CM | POA: Diagnosis not present

## 2021-06-10 ENCOUNTER — Telehealth: Payer: Self-pay

## 2021-06-10 NOTE — Telephone Encounter (Signed)
Please get auth for repeat inj thank you

## 2021-06-10 NOTE — Telephone Encounter (Signed)
Patient already has appt for gel injection in right knee at end of August.  Just wanted to make sure everything okay for that appt.

## 2021-06-11 ENCOUNTER — Telehealth: Payer: Self-pay

## 2021-06-11 NOTE — Telephone Encounter (Signed)
VOB submitted for Monovis, right knee. Pending BV.

## 2021-06-11 NOTE — Telephone Encounter (Signed)
Noted  

## 2021-06-18 ENCOUNTER — Telehealth: Payer: Self-pay

## 2021-06-18 NOTE — Telephone Encounter (Signed)
Approved for Monovisc, right knee. [place] deductible has been met Secondary insurance will pick up eligible expenses at 100%. No Co-pay No PA required  Appt. 07/24/2021 with Dr. Ninfa Linden

## 2021-06-26 DIAGNOSIS — E1169 Type 2 diabetes mellitus with other specified complication: Secondary | ICD-10-CM | POA: Diagnosis not present

## 2021-06-26 DIAGNOSIS — G43909 Migraine, unspecified, not intractable, without status migrainosus: Secondary | ICD-10-CM | POA: Diagnosis not present

## 2021-06-26 DIAGNOSIS — E7800 Pure hypercholesterolemia, unspecified: Secondary | ICD-10-CM | POA: Diagnosis not present

## 2021-06-26 DIAGNOSIS — J449 Chronic obstructive pulmonary disease, unspecified: Secondary | ICD-10-CM | POA: Diagnosis not present

## 2021-06-26 DIAGNOSIS — G8929 Other chronic pain: Secondary | ICD-10-CM | POA: Diagnosis not present

## 2021-06-26 DIAGNOSIS — I10 Essential (primary) hypertension: Secondary | ICD-10-CM | POA: Diagnosis not present

## 2021-06-26 DIAGNOSIS — M25562 Pain in left knee: Secondary | ICD-10-CM | POA: Diagnosis not present

## 2021-07-23 ENCOUNTER — Other Ambulatory Visit: Payer: Self-pay | Admitting: Podiatry

## 2021-07-23 ENCOUNTER — Ambulatory Visit (INDEPENDENT_AMBULATORY_CARE_PROVIDER_SITE_OTHER): Payer: Medicare Other | Admitting: Podiatry

## 2021-07-23 ENCOUNTER — Ambulatory Visit (INDEPENDENT_AMBULATORY_CARE_PROVIDER_SITE_OTHER): Payer: Medicare Other

## 2021-07-23 ENCOUNTER — Other Ambulatory Visit: Payer: Self-pay

## 2021-07-23 ENCOUNTER — Encounter: Payer: Self-pay | Admitting: Podiatry

## 2021-07-23 DIAGNOSIS — M722 Plantar fascial fibromatosis: Secondary | ICD-10-CM

## 2021-07-23 DIAGNOSIS — M19079 Primary osteoarthritis, unspecified ankle and foot: Secondary | ICD-10-CM

## 2021-07-23 MED ORDER — MELOXICAM 15 MG PO TABS: 15.0000 mg | ORAL_TABLET | Freq: Every day | ORAL | 3 refills | Status: DC

## 2021-07-23 NOTE — Progress Notes (Signed)
   Subjective: 70 year old female with PMHx of DM presenting today for follow up evaluation of bilateral foot and ankle pain. She reports sharp, throbbing pain that is exacerbated by walking. She has been taking Meloxicam as directed and she states that the meloxicam helped significantly  Past Medical History:  Diagnosis Date   Anemia    hx- high school   Asthma    Complication of anesthesia    problem being combative awakening from  hysterectomy   79 but had total knee 08 and did fine   COPD (chronic obstructive pulmonary disease) (HCC)    Diabetes mellitus    Dyspnea    GERD (gastroesophageal reflux disease)    occ   Headache(784.0)    migraines hx - couple/month   Hyperlipidemia    Hypertension    Osteoarthritis    fingers, hands   Osteopenia    PONV (postoperative nausea and vomiting)      Objective: Physical Exam General: The patient is alert and oriented x3 in no acute distress.  Dermatology: Skin is warm, dry and supple bilateral lower extremities. Negative for open lesions or macerations bilateral.   Vascular: Dorsalis Pedis and Posterior Tibial pulses palpable bilateral.  Capillary fill time is immediate to all digits.  Neurological: Epicritic and protective threshold intact bilateral.   Musculoskeletal: Tenderness to palpation to the plantar aspect of the bilateral heels along the plantar fascia; to the anterior, medial and lateral aspects of the bilateral ankles; to the midfoot bilaterally. All other joints range of motion within normal limits bilateral. Strength 5/5 in all groups bilateral.    Assessment: 1. plantar fasciitis bilateral feet   Plan of Care:  1. Patient evaluated. 2. Refill prescription for Meloxicam provided to patient.  3. Declined injection today.  4.  Continue wearing good supportive shoes and sandals advise against going barefoot 5.  Return to clinic as needed  Felecia Shelling, DPM [facility]  Dr. Felecia Shelling, DPM     [address] [place], [place] [address]                Office [phone]  Fax [phone]

## 2021-07-24 ENCOUNTER — Ambulatory Visit (INDEPENDENT_AMBULATORY_CARE_PROVIDER_SITE_OTHER): Payer: Medicare Other | Admitting: Orthopaedic Surgery

## 2021-07-24 DIAGNOSIS — M1711 Unilateral primary osteoarthritis, right knee: Secondary | ICD-10-CM

## 2021-07-24 MED ORDER — HYALURONAN 88 MG/4ML IX SOSY
88.0000 mg | PREFILLED_SYRINGE | INTRA_ARTICULAR | Status: AC | PRN
  Administered 2021-07-24: 88 mg via INTRA_ARTICULAR

## 2021-07-24 NOTE — Progress Notes (Signed)
   Procedure Note  Patient: Patricia Travis             Date of Birth: 06-25-1951           MRN: [id number]             Visit Date: 07/24/2021 HPI: Patricia Travis comes in today for right knee Monovisc injection.  She has had supplemental injections in the past which have done well.  Known mild osteoarthritis right knee she has had no new injury to the right knee.  She does have an upcoming left knee surgery for revision total knee that was done elsewhere that came loose.  She has no planned surgery on the right knee in the next 6 months.    Procedures: Visit Diagnoses:  1. Unilateral primary osteoarthritis, right knee     Large Joint Inj: R knee on 07/24/2021 9:07 AM Indications: pain Details: 22 G 1.5 in needle, anterolateral approach  Arthrogram: No  Medications: 88 mg Hyaluronan 88 MG/4ML Outcome: tolerated well, no immediate complications Procedure, treatment alternatives, risks and benefits explained, specific risks discussed. Consent was given by the patient. Immediately prior to procedure a time out was called to verify the correct patient, procedure, equipment, support staff and site/side marked as required. Patient was prepped and draped in the usual sterile fashion.    Plan: She understands to wait at least 6 months between supplemental injections right knee.  Follow-up on an as-needed basis for the right knee.  Questions were encouraged and answered.

## 2021-07-31 ENCOUNTER — Other Ambulatory Visit: Payer: Self-pay | Admitting: Physician Assistant

## 2021-08-01 ENCOUNTER — Other Ambulatory Visit: Payer: Self-pay

## 2021-08-08 NOTE — Pre-Procedure Instructions (Signed)
Surgical Instructions   Your procedure is scheduled on Tuesday, September 20th. Report to [facility] Main Entrance "A" at 12:30 P.M., then check in with the Admitting office. Call this number if you have problems the morning of surgery: [phone]   If you have any questions prior to your surgery date call [phone]: Open Monday-Friday 8am-4pm   Remember: Do not eat after midnight the night before your surgery  You may drink clear liquids until 11:30 AM the morning of your surgery.   Clear liquids allowed are: Water, Non-Citrus Juices (without pulp), Carbonated Beverages, Clear Tea, Black Coffee ONLY (NO MILK, CREAM OR POWDERED CREAMER of any kind), and Gatorade    Patient Instructions  The night before surgery:  No food after midnight. ONLY clear liquids after midnight  The day of surgery (if you have diabetes): Drink ONE (1) 10 ox water given to you in your pre admission testing appointment by 11:30 AM the morning of surgery. Drink in one sitting. Do not sip.  This drink was given to you during your hospital  pre-op appointment visit.  Nothing else to drink after completing the  water.         If you have questions, please contact your surgeon's office.    Take these medicines the morning of surgery with A SIP OF WATER  amLODipine (NORVASC)   If needed: albuterol (VENTOLIN HFA)- bring inhaler with you on day of surgery traMADol (ULTRAM) umeclidinium bromide (INCRUSE ELLIPTA)   WHAT DO I DO ABOUT MY DIABETES MEDICATION?   Do not take oral diabetes medicines (pills) the morning of surgery.  (9/19) THE DAY BEFORE SURGERY, take ONLY morning dose of glimepiride (AMARYL). Do NOT take evening dose of glimepiride (AMARYL). Take usual dose of metFORMIN (GLUCOPHAGE). Take usual dose of sitaGLIPtin (JANUVIA).   (9/20) THE MORNING OF SURGERY, DO NOT take glimepiride (AMARYL). Do NOT take metFORMIN (GLUCOPHAGE). Do NOT take sitaGLIPtin (JANUVIA).    HOW TO MANAGE YOUR  DIABETES BEFORE AND AFTER SURGERY  Why is it important to control my blood sugar before and after surgery? Improving blood sugar levels before and after surgery helps healing and can limit problems. A way of improving blood sugar control is eating a healthy diet by:  Eating less sugar and carbohydrates  Increasing activity/exercise  Talking with your doctor about reaching your blood sugar goals High blood sugars (greater than 180 mg/dL) can raise your risk of infections and slow your recovery, so you will need to focus on controlling your diabetes during the weeks before surgery. Make sure that the doctor who takes care of your diabetes knows about your planned surgery including the date and location.  How do I manage my blood sugar before surgery? Check your blood sugar at least 4 times a day, starting 2 days before surgery, to make sure that the level is not too high or low.  Check your blood sugar the morning of your surgery when you wake up and every 2 hours until you get to the Short Stay unit.  If your blood sugar is less than 70 mg/dL, you will need to treat for low blood sugar: Do not take insulin. Treat a low blood sugar (less than 70 mg/dL) with  cup of clear juice (cranberry or apple), 4 glucose tablets, OR glucose gel. Recheck blood sugar in 15 minutes after treatment (to make sure it is greater than 70 mg/dL). If your blood sugar is not greater than 70 mg/dL on recheck, call [phone] for further  instructions. Report your blood sugar to the short stay nurse when you get to Short Stay.  If you are admitted to the hospital after surgery: Your blood sugar will be checked by the staff and you will probably be given insulin after surgery (instead of oral diabetes medicines) to make sure you have good blood sugar levels. The goal for blood sugar control after surgery is 80-180 mg/dL.     As of today, STOP taking any Aspirin (unless otherwise instructed by your surgeon) Aleve,  meloxicam (MOBIC), Naproxen, Ibuprofen, Motrin, Advil, Goody's, BC's, all herbal medications, fish oil, and all vitamins.                       Do NOT Smoke (Tobacco/Vaping)  24 hours prior to your procedure If you use a CPAP at night, you may bring your mask for your overnight stay.   Contacts, glasses, dentures or bridgework may not be worn into surgery, please bring cases for these belongings   For patients admitted to the hospital, discharge time will be determined by your treatment team.   Patients discharged the day of surgery will not be allowed to drive home, and someone needs to stay with them for 24 hours.  NO VISITORS WILL BE ALLOWED IN PRE-OP WHERE PATIENTS GET READY FOR SURGERY.  ONLY 1 SUPPORT PERSON MAY BE PRESENT WHILE YOU ARE IN SURGERY.  IF YOU ARE TO BE ADMITTED, ONCE YOU ARE IN YOUR ROOM YOU WILL BE ALLOWED TWO (2) VISITORS.  Minor children may have two parents present. Special consideration for safety and communication needs will be reviewed on a case by case basis.  Special instructions:    Oral Hygiene is also important to reduce your risk of infection.  Remember - BRUSH YOUR TEETH THE MORNING OF SURGERY WITH YOUR REGULAR TOOTHPASTE   [facility]- Preparing For Surgery  Before surgery, you can play an important role. Because skin is not sterile, your skin needs to be as free of germs as possible. You can reduce the number of germs on your skin by washing with CHG (chlorahexidine gluconate) Soap before surgery.  CHG is an antiseptic cleaner which kills germs and bonds with the skin to continue killing germs even after washing.     Please do not use if you have an allergy to CHG or antibacterial soaps. If your skin becomes reddened/irritated stop using the CHG.  Do not shave (including legs and underarms) for at least 48 hours prior to first CHG shower. It is OK to shave your face.  Please follow these instructions carefully.     Shower the NIGHT BEFORE SURGERY and  the MORNING OF SURGERY with CHG Soap.   If you chose to wash your hair, wash your hair first as usual with your normal shampoo. After you shampoo, rinse your hair and body thoroughly to remove the shampoo.  Then [employer] and genitals (private parts) with your normal soap and rinse thoroughly to remove soap.  After that Use CHG Soap as you would any other liquid soap. You can apply CHG directly to the skin and wash gently with a scrungie or a clean washcloth.   Apply the CHG Soap to your body ONLY FROM THE NECK DOWN.  Do not use on open wounds or open sores. Avoid contact with your eyes, ears, mouth and genitals (private parts). Wash Face and genitals (private parts)  with your normal soap.   Wash thoroughly, paying special attention to the  area where your surgery will be performed.  Thoroughly rinse your body with warm water from the neck down.  DO NOT shower/wash with your normal soap after using and rinsing off the CHG Soap.  Pat yourself dry with a CLEAN TOWEL.  Wear CLEAN PAJAMAS to bed the night before surgery  Place CLEAN SHEETS on your bed the night before your surgery  DO NOT SLEEP WITH PETS.   Day of Surgery: Do not wear jewelry or makeup Do not wear lotions, powders, perfumes or deodorant. Do not shave 48 hours prior to surgery.   Do not bring valuables to the hospital. [facility] is not responsible for any belongings or valuables. Do not wear nail polish, gel polish, artificial nails, or any other type of covering on natural nails including finger and toenails. If patients have artificial nails, gel coating, etc. that need to be removed by a nail salon please have this removed prior to surgery or surgery may need to be canceled/delayed if the surgeon/ anesthesia feels like the patient is unable to be adequately monitored. Take a shower with CHG soap. Wear Clean/Comfortable clothing the morning of surgery Do not apply any deodorants/lotions.   Remember to brush your teeth  WITH YOUR REGULAR TOOTHPASTE.   Please read over the following fact sheets that you were given.

## 2021-08-09 ENCOUNTER — Encounter (HOSPITAL_COMMUNITY): Payer: Self-pay

## 2021-08-09 ENCOUNTER — Encounter (HOSPITAL_COMMUNITY)
Admission: RE | Admit: 2021-08-09 | Discharge: 2021-08-09 | Disposition: A | Discharge status: Home / Self Care | Payer: Medicare Other | Source: Ambulatory Visit | Attending: Orthopaedic Surgery | Admitting: Orthopaedic Surgery

## 2021-08-09 ENCOUNTER — Other Ambulatory Visit: Payer: Self-pay

## 2021-08-09 DIAGNOSIS — Z20822 Contact with and (suspected) exposure to covid-19: Secondary | ICD-10-CM | POA: Diagnosis not present

## 2021-08-09 DIAGNOSIS — E118 Type 2 diabetes mellitus with unspecified complications: Secondary | ICD-10-CM | POA: Diagnosis not present

## 2021-08-09 DIAGNOSIS — Z01818 Encounter for other preprocedural examination: Secondary | ICD-10-CM | POA: Diagnosis not present

## 2021-08-09 LAB — CBC
HCT: 39.9 % (ref 36.0–46.0)
Hemoglobin: 12.6 g/dL (ref 12.0–15.0)
MCH: 29.3 pg (ref 26.0–34.0)
MCHC: 31.6 g/dL (ref 30.0–36.0)
MCV: 92.8 fL (ref 80.0–100.0)
Platelets: 210 10*3/uL (ref 150–400)
RBC: 4.3 MIL/uL (ref 3.87–5.11)
RDW: 12.5 % (ref 11.5–15.5)
WBC: 10.2 10*3/uL (ref 4.0–10.5)
nRBC: 0 % (ref 0.0–0.2)

## 2021-08-09 LAB — SURGICAL PCR SCREEN
MRSA, PCR: NEGATIVE
Staphylococcus aureus: NEGATIVE

## 2021-08-09 LAB — TYPE AND SCREEN
ABO/RH(D): O POS
Antibody Screen: NEGATIVE

## 2021-08-09 LAB — BASIC METABOLIC PANEL
Anion gap: 8 (ref 5–15)
BUN: 15 mg/dL (ref 8–23)
CO2: 24 mmol/L (ref 22–32)
Calcium: 9 mg/dL (ref 8.9–10.3)
Chloride: 104 mmol/L (ref 98–111)
Creatinine, Ser: 0.61 mg/dL (ref 0.44–1.00)
GFR, Estimated: >60 mL/min (ref >60)
Glucose, Bld: 221 mg/dL — ABNORMAL HIGH (ref 70–99)
Potassium: 4.5 mmol/L (ref 3.5–5.1)
Sodium: 136 mmol/L (ref 135–145)

## 2021-08-09 LAB — GLUCOSE, CAPILLARY: Glucose-Capillary: 229 mg/dL — ABNORMAL HIGH (ref 70–99)

## 2021-08-09 LAB — SARS CORONAVIRUS 2 (TAT 6-24 HRS): SARS Coronavirus 2: NEGATIVE

## 2021-08-09 NOTE — Progress Notes (Signed)
PCP - Dr. Beverley Fiedler Cardiologist - denies  PPM/ICD - denies   Chest x-ray - 08/11/12 EKG - 08/17/12 Stress Test - denies ECHO - denies Cardiac Cath - denies  Sleep Study - denies  DM: Type 2 Fasting Blood Sugar - 140-170 Checks Blood Sugar 2 times a day  Blood Thinner Instructions: n/a Aspirin Instructions: n/a  ERAS Protcol - yes PRE-SURGERY water  COVID TEST- 08/09/21 at PAT appt, results pending   Anesthesia review: No  Patient denies shortness of breath, fever, cough and chest pain at PAT appointment   All instructions explained to the patient, with a verbal understanding of the material. Patient agrees to go over the instructions while at home for a better understanding.  The opportunity to ask questions was provided.

## 2021-08-12 ENCOUNTER — Telehealth: Payer: Self-pay | Admitting: Orthopaedic Surgery

## 2021-08-12 NOTE — Telephone Encounter (Signed)
Dorene Sorrow from [facility] calling on behalf of the pt to verify which ins we had on file for the pt. They are getting paper work set up in prep for post surg and wanted to make sure medicare was what was on file for [place]. No further questions at this time. Call back for him is [phone].

## 2021-08-13 ENCOUNTER — Inpatient Hospital Stay (HOSPITAL_COMMUNITY): Payer: Medicare Other | Admitting: Anesthesiology

## 2021-08-13 ENCOUNTER — Inpatient Hospital Stay (HOSPITAL_COMMUNITY)
Admission: RE | Admit: 2021-08-13 | Discharge: 2021-08-15 | DRG: 468 | Disposition: A | Discharge status: Home Health | Payer: Medicare Other | Attending: Orthopaedic Surgery | Admitting: Orthopaedic Surgery

## 2021-08-13 ENCOUNTER — Inpatient Hospital Stay (HOSPITAL_COMMUNITY): Payer: Medicare Other

## 2021-08-13 ENCOUNTER — Encounter (HOSPITAL_COMMUNITY): Payer: Self-pay | Admitting: Orthopaedic Surgery

## 2021-08-13 ENCOUNTER — Encounter (HOSPITAL_COMMUNITY)
Admission: RE | Disposition: A | Discharge status: Home Health | Payer: Self-pay | Source: Home / Self Care | Attending: Orthopaedic Surgery

## 2021-08-13 DIAGNOSIS — J449 Chronic obstructive pulmonary disease, unspecified: Secondary | ICD-10-CM | POA: Diagnosis present

## 2021-08-13 DIAGNOSIS — M2352 Chronic instability of knee, left knee: Secondary | ICD-10-CM | POA: Diagnosis present

## 2021-08-13 DIAGNOSIS — I10 Essential (primary) hypertension: Secondary | ICD-10-CM | POA: Diagnosis present

## 2021-08-13 DIAGNOSIS — Z9889 Other specified postprocedural states: Secondary | ICD-10-CM | POA: Diagnosis not present

## 2021-08-13 DIAGNOSIS — Z8249 Family history of ischemic heart disease and other diseases of the circulatory system: Secondary | ICD-10-CM | POA: Diagnosis not present

## 2021-08-13 DIAGNOSIS — Z803 Family history of malignant neoplasm of breast: Secondary | ICD-10-CM | POA: Diagnosis not present

## 2021-08-13 DIAGNOSIS — Z881 Allergy status to other antibiotic agents status: Secondary | ICD-10-CM

## 2021-08-13 DIAGNOSIS — Z888 Allergy status to other drugs, medicaments and biological substances status: Secondary | ICD-10-CM

## 2021-08-13 DIAGNOSIS — Z87891 Personal history of nicotine dependence: Secondary | ICD-10-CM | POA: Diagnosis not present

## 2021-08-13 DIAGNOSIS — Z96652 Presence of left artificial knee joint: Secondary | ICD-10-CM

## 2021-08-13 DIAGNOSIS — Z7984 Long term (current) use of oral hypoglycemic drugs: Secondary | ICD-10-CM

## 2021-08-13 DIAGNOSIS — Z884 Allergy status to anesthetic agent status: Secondary | ICD-10-CM | POA: Diagnosis not present

## 2021-08-13 DIAGNOSIS — Z20822 Contact with and (suspected) exposure to covid-19: Secondary | ICD-10-CM | POA: Diagnosis present

## 2021-08-13 DIAGNOSIS — T84063A Wear of articular bearing surface of internal prosthetic left knee joint, initial encounter: Secondary | ICD-10-CM | POA: Diagnosis not present

## 2021-08-13 DIAGNOSIS — G8918 Other acute postprocedural pain: Secondary | ICD-10-CM | POA: Diagnosis not present

## 2021-08-13 DIAGNOSIS — Z79899 Other long term (current) drug therapy: Secondary | ICD-10-CM | POA: Diagnosis not present

## 2021-08-13 DIAGNOSIS — Z96642 Presence of left artificial hip joint: Secondary | ICD-10-CM | POA: Diagnosis not present

## 2021-08-13 DIAGNOSIS — M25462 Effusion, left knee: Secondary | ICD-10-CM | POA: Diagnosis present

## 2021-08-13 DIAGNOSIS — M25562 Pain in left knee: Secondary | ICD-10-CM | POA: Diagnosis present

## 2021-08-13 DIAGNOSIS — Z833 Family history of diabetes mellitus: Secondary | ICD-10-CM

## 2021-08-13 DIAGNOSIS — M1990 Unspecified osteoarthritis, unspecified site: Secondary | ICD-10-CM | POA: Diagnosis present

## 2021-08-13 DIAGNOSIS — Z880 Allergy status to penicillin: Secondary | ICD-10-CM

## 2021-08-13 DIAGNOSIS — E785 Hyperlipidemia, unspecified: Secondary | ICD-10-CM | POA: Diagnosis not present

## 2021-08-13 DIAGNOSIS — M8580 Other specified disorders of bone density and structure, unspecified site: Secondary | ICD-10-CM | POA: Diagnosis not present

## 2021-08-13 DIAGNOSIS — D649 Anemia, unspecified: Secondary | ICD-10-CM | POA: Diagnosis present

## 2021-08-13 DIAGNOSIS — K219 Gastro-esophageal reflux disease without esophagitis: Secondary | ICD-10-CM | POA: Diagnosis present

## 2021-08-13 DIAGNOSIS — Z8261 Family history of arthritis: Secondary | ICD-10-CM

## 2021-08-13 DIAGNOSIS — Z791 Long term (current) use of non-steroidal anti-inflammatories (NSAID): Secondary | ICD-10-CM

## 2021-08-13 DIAGNOSIS — T84063D Wear of articular bearing surface of internal prosthetic left knee joint, subsequent encounter: Secondary | ICD-10-CM | POA: Diagnosis not present

## 2021-08-13 DIAGNOSIS — T84033A Mechanical loosening of internal left knee prosthetic joint, initial encounter: Secondary | ICD-10-CM | POA: Diagnosis not present

## 2021-08-13 DIAGNOSIS — E119 Type 2 diabetes mellitus without complications: Secondary | ICD-10-CM | POA: Diagnosis present

## 2021-08-13 DIAGNOSIS — R600 Localized edema: Secondary | ICD-10-CM | POA: Diagnosis not present

## 2021-08-13 DIAGNOSIS — Z471 Aftercare following joint replacement surgery: Secondary | ICD-10-CM | POA: Diagnosis not present

## 2021-08-13 DIAGNOSIS — E1142 Type 2 diabetes mellitus with diabetic polyneuropathy: Secondary | ICD-10-CM | POA: Diagnosis not present

## 2021-08-13 HISTORY — PX: TOTAL KNEE REVISION: SHX996

## 2021-08-13 LAB — GLUCOSE, CAPILLARY
Glucose-Capillary: 191 mg/dL — ABNORMAL HIGH (ref 70–99)
Glucose-Capillary: 148 mg/dL — ABNORMAL HIGH (ref 70–99)

## 2021-08-13 LAB — ABO/RH: ABO/RH(D): O POS

## 2021-08-13 SURGERY — TOTAL KNEE REVISION
Anesthesia: Spinal | Site: Knee | Laterality: Left

## 2021-08-13 MED ORDER — METHOCARBAMOL 1000 MG/10ML IJ SOLN
500.0000 mg | Freq: Four times a day (QID) | INTRAVENOUS | Status: DC | PRN
  Filled 2021-08-13: qty 5

## 2021-08-13 MED ORDER — MIDAZOLAM HCL 2 MG/2ML IJ SOLN: 1.0000 mg | Freq: Once | INTRAMUSCULAR | Status: AC

## 2021-08-13 MED ORDER — LOSARTAN POTASSIUM 25 MG PO TABS
25.0000 mg | ORAL_TABLET | Freq: Every day | ORAL | Status: DC
  Administered 2021-08-14 – 2021-08-15 (×2): 25 mg via ORAL
  Filled 2021-08-13 (×2): qty 1

## 2021-08-13 MED ORDER — MENTHOL 3 MG MT LOZG: 1.0000 | LOZENGE | OROMUCOSAL | Status: DC | PRN

## 2021-08-13 MED ORDER — LACTATED RINGERS IV SOLN: INTRAVENOUS | Status: DC

## 2021-08-13 MED ORDER — CHLORHEXIDINE GLUCONATE 0.12 % MT SOLN
15.0000 mL | Freq: Once | OROMUCOSAL | Status: AC
  Administered 2021-08-13: 15 mL via OROMUCOSAL
  Filled 2021-08-13: qty 15

## 2021-08-13 MED ORDER — MIDAZOLAM HCL 2 MG/2ML IJ SOLN
INTRAMUSCULAR | Status: AC
  Administered 2021-08-13: 1 mg via INTRAVENOUS
  Filled 2021-08-13: qty 2

## 2021-08-13 MED ORDER — PROPOFOL 500 MG/50ML IV EMUL
INTRAVENOUS | Status: DC | PRN
  Administered 2021-08-13: 75 ug/kg/min via INTRAVENOUS

## 2021-08-13 MED ORDER — GABAPENTIN 600 MG PO TABS
600.0000 mg | ORAL_TABLET | Freq: Every day | ORAL | Status: DC
  Administered 2021-08-13 – 2021-08-14 (×2): 600 mg via ORAL
  Filled 2021-08-13 (×2): qty 1

## 2021-08-13 MED ORDER — SODIUM CHLORIDE 0.9 % IV SOLN: INTRAVENOUS | Status: DC

## 2021-08-13 MED ORDER — UMECLIDINIUM BROMIDE 62.5 MCG/INH IN AEPB: 1.0000 | INHALATION_SPRAY | Freq: Every day | RESPIRATORY_TRACT | Status: DC | PRN

## 2021-08-13 MED ORDER — ONDANSETRON HCL 4 MG PO TABS
4.0000 mg | ORAL_TABLET | Freq: Four times a day (QID) | ORAL | Status: DC | PRN
  Administered 2021-08-13 – 2021-08-14 (×2): 4 mg via ORAL
  Filled 2021-08-13 (×2): qty 1

## 2021-08-13 MED ORDER — METOCLOPRAMIDE HCL 5 MG PO TABS: 5.0000 mg | ORAL_TABLET | Freq: Three times a day (TID) | ORAL | Status: DC | PRN

## 2021-08-13 MED ORDER — ASPIRIN 81 MG PO CHEW
81.0000 mg | CHEWABLE_TABLET | Freq: Two times a day (BID) | ORAL | Status: DC
  Administered 2021-08-13 – 2021-08-15 (×4): 81 mg via ORAL
  Filled 2021-08-13 (×4): qty 1

## 2021-08-13 MED ORDER — POLYETHYLENE GLYCOL 3350 17 G PO PACK: 17.0000 g | PACK | Freq: Every day | ORAL | Status: DC | PRN

## 2021-08-13 MED ORDER — FENTANYL CITRATE (PF) 100 MCG/2ML IJ SOLN
INTRAMUSCULAR | Status: AC
  Administered 2021-08-13: 50 ug via INTRAVENOUS
  Filled 2021-08-13: qty 2

## 2021-08-13 MED ORDER — LINAGLIPTIN 5 MG PO TABS
5.0000 mg | ORAL_TABLET | Freq: Every day | ORAL | Status: DC
  Administered 2021-08-14 – 2021-08-15 (×2): 5 mg via ORAL
  Filled 2021-08-13 (×2): qty 1

## 2021-08-13 MED ORDER — GLIMEPIRIDE 4 MG PO TABS
2.0000 mg | ORAL_TABLET | Freq: Two times a day (BID) | ORAL | Status: DC
  Administered 2021-08-14 – 2021-08-15 (×3): 2 mg via ORAL
  Filled 2021-08-13 (×4): qty 1

## 2021-08-13 MED ORDER — METHOCARBAMOL 500 MG PO TABS
500.0000 mg | ORAL_TABLET | Freq: Four times a day (QID) | ORAL | Status: DC | PRN
  Administered 2021-08-13 – 2021-08-15 (×2): 500 mg via ORAL
  Filled 2021-08-13 (×2): qty 1

## 2021-08-13 MED ORDER — HYDROMORPHONE HCL 1 MG/ML IJ SOLN
0.5000 mg | INTRAMUSCULAR | Status: DC | PRN
  Administered 2021-08-14: 1 mg via INTRAVENOUS
  Filled 2021-08-13: qty 1

## 2021-08-13 MED ORDER — DIPHENHYDRAMINE HCL 12.5 MG/5ML PO ELIX: 12.5000 mg | ORAL_SOLUTION | ORAL | Status: DC | PRN

## 2021-08-13 MED ORDER — CLINDAMYCIN PHOSPHATE 900 MG/50ML IV SOLN
900.0000 mg | INTRAVENOUS | Status: AC
  Administered 2021-08-13: 900 mg via INTRAVENOUS
  Filled 2021-08-13: qty 50

## 2021-08-13 MED ORDER — SIMVASTATIN 20 MG PO TABS
40.0000 mg | ORAL_TABLET | Freq: Every evening | ORAL | Status: DC
  Administered 2021-08-13 – 2021-08-14 (×2): 40 mg via ORAL
  Filled 2021-08-13 (×3): qty 2

## 2021-08-13 MED ORDER — BUPIVACAINE-EPINEPHRINE (PF) 0.5% -1:200000 IJ SOLN
INTRAMUSCULAR | Status: DC | PRN
  Administered 2021-08-13: 30 mL via PERINEURAL

## 2021-08-13 MED ORDER — PROPOFOL 10 MG/ML IV BOLUS
INTRAVENOUS | Status: DC | PRN
  Administered 2021-08-13: 20 mg via INTRAVENOUS

## 2021-08-13 MED ORDER — ONDANSETRON HCL 4 MG/2ML IJ SOLN
4.0000 mg | Freq: Four times a day (QID) | INTRAMUSCULAR | Status: DC | PRN
  Filled 2021-08-13: qty 2

## 2021-08-13 MED ORDER — CLINDAMYCIN PHOSPHATE 600 MG/50ML IV SOLN
600.0000 mg | Freq: Four times a day (QID) | INTRAVENOUS | Status: AC
  Administered 2021-08-13 – 2021-08-14 (×2): 600 mg via INTRAVENOUS
  Filled 2021-08-13 (×2): qty 50

## 2021-08-13 MED ORDER — POVIDONE-IODINE 10 % EX SWAB: 2.0000 "application " | Freq: Once | CUTANEOUS | Status: DC

## 2021-08-13 MED ORDER — OXYCODONE HCL 5 MG PO TABS
10.0000 mg | ORAL_TABLET | ORAL | Status: DC | PRN
  Administered 2021-08-13: 15 mg via ORAL
  Filled 2021-08-13: qty 3

## 2021-08-13 MED ORDER — GABAPENTIN 100 MG PO CAPS
100.0000 mg | ORAL_CAPSULE | Freq: Every day | ORAL | Status: DC
  Administered 2021-08-13 – 2021-08-14 (×2): 100 mg via ORAL
  Filled 2021-08-13 (×2): qty 1

## 2021-08-13 MED ORDER — ACETAMINOPHEN 325 MG PO TABS
325.0000 mg | ORAL_TABLET | Freq: Four times a day (QID) | ORAL | Status: DC | PRN
  Administered 2021-08-15: 650 mg via ORAL
  Filled 2021-08-13: qty 2

## 2021-08-13 MED ORDER — SODIUM CHLORIDE 0.9 % IR SOLN
Status: DC | PRN
  Administered 2021-08-13: 1000 mL
  Administered 2021-08-13: 3000 mL

## 2021-08-13 MED ORDER — AMLODIPINE BESYLATE 5 MG PO TABS
5.0000 mg | ORAL_TABLET | Freq: Every day | ORAL | Status: DC
  Administered 2021-08-14 – 2021-08-15 (×2): 5 mg via ORAL
  Filled 2021-08-13 (×3): qty 1

## 2021-08-13 MED ORDER — DOCUSATE SODIUM 100 MG PO CAPS
100.0000 mg | ORAL_CAPSULE | Freq: Two times a day (BID) | ORAL | Status: DC
  Administered 2021-08-13 – 2021-08-15 (×4): 100 mg via ORAL
  Filled 2021-08-13 (×5): qty 1

## 2021-08-13 MED ORDER — FENTANYL CITRATE (PF) 100 MCG/2ML IJ SOLN: 50.0000 ug | Freq: Once | INTRAMUSCULAR | Status: AC

## 2021-08-13 MED ORDER — PANTOPRAZOLE SODIUM 40 MG PO TBEC
40.0000 mg | DELAYED_RELEASE_TABLET | Freq: Every day | ORAL | Status: DC
  Administered 2021-08-14 – 2021-08-15 (×2): 40 mg via ORAL
  Filled 2021-08-13 (×2): qty 1

## 2021-08-13 MED ORDER — TRANEXAMIC ACID-NACL 1000-0.7 MG/100ML-% IV SOLN
1000.0000 mg | INTRAVENOUS | Status: AC
  Administered 2021-08-13: 1000 mg via INTRAVENOUS
  Filled 2021-08-13: qty 100

## 2021-08-13 MED ORDER — HYDROMORPHONE HCL 1 MG/ML IJ SOLN: 0.2500 mg | INTRAMUSCULAR | Status: DC | PRN

## 2021-08-13 MED ORDER — ORAL CARE MOUTH RINSE: 15.0000 mL | Freq: Once | OROMUCOSAL | Status: AC

## 2021-08-13 MED ORDER — BUPIVACAINE IN DEXTROSE 0.75-8.25 % IT SOLN
INTRATHECAL | Status: DC | PRN
  Administered 2021-08-13: 1.8 mL via INTRATHECAL

## 2021-08-13 MED ORDER — OXYCODONE HCL 5 MG PO TABS: 5.0000 mg | ORAL_TABLET | ORAL | Status: DC | PRN

## 2021-08-13 MED ORDER — METOCLOPRAMIDE HCL 5 MG/ML IJ SOLN: 5.0000 mg | Freq: Three times a day (TID) | INTRAMUSCULAR | Status: DC | PRN

## 2021-08-13 MED ORDER — ALBUTEROL SULFATE HFA 108 (90 BASE) MCG/ACT IN AERS: 1.0000 | INHALATION_SPRAY | Freq: Four times a day (QID) | RESPIRATORY_TRACT | Status: DC | PRN

## 2021-08-13 MED ORDER — ALUM & MAG HYDROXIDE-SIMETH 200-200-20 MG/5ML PO SUSP: 30.0000 mL | ORAL | Status: DC | PRN

## 2021-08-13 MED ORDER — METFORMIN HCL 500 MG PO TABS
1000.0000 mg | ORAL_TABLET | Freq: Two times a day (BID) | ORAL | Status: DC
  Administered 2021-08-14 – 2021-08-15 (×3): 1000 mg via ORAL
  Filled 2021-08-13 (×3): qty 2

## 2021-08-13 MED ORDER — PHENOL 1.4 % MT LIQD: 1.0000 | OROMUCOSAL | Status: DC | PRN

## 2021-08-13 SURGICAL SUPPLY — 73 items
BAG COUNTER SPONGE SURGICOUNT (BAG) ×2 IMPLANT
BAG SPNG CNTER NS LX DISP (BAG) ×1
BANDAGE ESMARK 6X9 LF (GAUZE/BANDAGES/DRESSINGS) ×1 IMPLANT
BLADE CLIPPER SURG (BLADE) IMPLANT
BLADE SAGITTAL (BLADE) ×2
BLADE SAW THK.89X75X18XSGTL (BLADE) ×1 IMPLANT
BNDG CMPR 9X6 STRL LF SNTH (GAUZE/BANDAGES/DRESSINGS) ×1
BNDG COHESIVE 6X5 TAN STRL LF (GAUZE/BANDAGES/DRESSINGS) ×4 IMPLANT
BNDG ELASTIC 6X5.8 VLCR STR LF (GAUZE/BANDAGES/DRESSINGS) ×2 IMPLANT
BNDG ESMARK 6X9 LF (GAUZE/BANDAGES/DRESSINGS) ×2
BOWL SMART MIX CTS (DISPOSABLE) ×2 IMPLANT
CEMENT BONE R 1X40 (Cement) ×2 IMPLANT
COOLER ICEMAN CLASSIC (MISCELLANEOUS) ×2 IMPLANT
COVER SURGICAL LIGHT HANDLE (MISCELLANEOUS) ×2 IMPLANT
CUFF TOURN SGL QUICK 34 (TOURNIQUET CUFF) ×2
CUFF TOURN SGL QUICK 42 (TOURNIQUET CUFF) IMPLANT
CUFF TRNQT CYL 34X4.125X (TOURNIQUET CUFF) ×1 IMPLANT
DRAPE ORTHO SPLIT 77X108 STRL (DRAPES) ×4
DRAPE SURG ORHT 6 SPLT 77X108 (DRAPES) ×2 IMPLANT
DRAPE U-SHAPE 47X51 STRL (DRAPES) ×2 IMPLANT
DRSG PAD ABDOMINAL 8X10 ST (GAUZE/BANDAGES/DRESSINGS) ×2 IMPLANT
DRSG XEROFORM 1X8 (GAUZE/BANDAGES/DRESSINGS) ×2 IMPLANT
DURAPREP 26ML APPLICATOR (WOUND CARE) ×2 IMPLANT
ELECT REM PT RETURN 9FT ADLT (ELECTROSURGICAL) ×2
ELECTRODE REM PT RTRN 9FT ADLT (ELECTROSURGICAL) ×1 IMPLANT
EVACUATOR 1/8 PVC DRAIN (DRAIN) IMPLANT
FACESHIELD STD STERILE (MASK) ×4 IMPLANT
GAUZE SPONGE 4X4 12PLY STRL (GAUZE/BANDAGES/DRESSINGS) ×2 IMPLANT
GAUZE SPONGE 4X4 12PLY STRL LF (GAUZE/BANDAGES/DRESSINGS) ×2 IMPLANT
GAUZE XEROFORM 1X8 LF (GAUZE/BANDAGES/DRESSINGS) ×2 IMPLANT
GLOVE SRG 8 PF TXTR STRL LF DI (GLOVE) ×2 IMPLANT
GLOVE SURG ENC MOIS LTX SZ8 (GLOVE) ×2 IMPLANT
GLOVE SURG ORTHO LTX SZ7.5 (GLOVE) ×2 IMPLANT
GLOVE SURG UNDER POLY LF SZ8 (GLOVE) ×4
GOWN STRL REUS W/ TWL LRG LVL3 (GOWN DISPOSABLE) ×1 IMPLANT
GOWN STRL REUS W/ TWL XL LVL3 (GOWN DISPOSABLE) ×2 IMPLANT
GOWN STRL REUS W/TWL LRG LVL3 (GOWN DISPOSABLE) ×2
GOWN STRL REUS W/TWL XL LVL3 (GOWN DISPOSABLE) ×4
HANDPIECE INTERPULSE COAX TIP (DISPOSABLE)
IMMOBILIZER KNEE 22 UNIV (SOFTGOODS) ×2 IMPLANT
INSERT TIB NK FLEX 1/2 11 LT (Joint) ×2 IMPLANT
K-WIRE SURGICAL 1.6X102 (WIRE) ×2 IMPLANT
KIT BASIN OR (CUSTOM PROCEDURE TRAY) ×2 IMPLANT
KIT TURNOVER KIT B (KITS) ×2 IMPLANT
MANIFOLD NEPTUNE II (INSTRUMENTS) ×2 IMPLANT
NS IRRIG 1000ML POUR BTL (IV SOLUTION) ×2 IMPLANT
PACK TOTAL JOINT (CUSTOM PROCEDURE TRAY) ×2 IMPLANT
PAD COLD SHLDR WRAP-ON (PAD) ×2 IMPLANT
PADDING CAST ABS 6INX4YD NS (CAST SUPPLIES) ×1
PADDING CAST ABS COTTON 6X4 NS (CAST SUPPLIES) ×1 IMPLANT
PADDING CAST COTTON 6X4 STRL (CAST SUPPLIES) ×2 IMPLANT
SET HNDPC FAN SPRY TIP SCT (DISPOSABLE) IMPLANT
SET PAD KNEE POSITIONER (MISCELLANEOUS) ×2 IMPLANT
STAPLER VISISTAT 35W (STAPLE) ×2 IMPLANT
STEM POLY PAT PLY 32M KNEE (Knees) ×2 IMPLANT
SUCTION FRAZIER HANDLE 10FR (MISCELLANEOUS) ×2
SUCTION FRAZIER TIP 8 FR DISP (SUCTIONS) ×2
SUCTION TUBE FRAZIER 10FR DISP (MISCELLANEOUS) ×1 IMPLANT
SUCTION TUBE FRAZIER 8FR DISP (SUCTIONS) ×1 IMPLANT
SUT VIC AB 0 CT1 27 (SUTURE) ×4
SUT VIC AB 0 CT1 27XBRD ANBCTR (SUTURE) ×2 IMPLANT
SUT VIC AB 1 CT1 27 (SUTURE) ×4
SUT VIC AB 1 CT1 27XBRD ANBCTR (SUTURE) ×2 IMPLANT
SUT VIC AB 2-0 CT1 27 (SUTURE) ×4
SUT VIC AB 2-0 CT1 TAPERPNT 27 (SUTURE) ×2 IMPLANT
SWAB COLLECTION DEVICE MRSA (MISCELLANEOUS) IMPLANT
SWAB CULTURE ESWAB REG 1ML (MISCELLANEOUS) ×2 IMPLANT
TOWEL GREEN STERILE (TOWEL DISPOSABLE) ×2 IMPLANT
TOWEL GREEN STERILE FF (TOWEL DISPOSABLE) ×2 IMPLANT
TRAY FOLEY W/BAG SLVR 16FR (SET/KITS/TRAYS/PACK)
TRAY FOLEY W/BAG SLVR 16FR ST (SET/KITS/TRAYS/PACK) IMPLANT
WATER STERILE IRR 1000ML POUR (IV SOLUTION) ×6 IMPLANT
WRAP KNEE MAXI GEL POST OP (GAUZE/BANDAGES/DRESSINGS) ×2 IMPLANT

## 2021-08-13 NOTE — Op Note (Signed)
polyNAME: Patricia Travis, Patricia Travis MEDICAL RECORD NO: [id number] ACCOUNT NO: [id number] DATE OF BIRTH: 1951-07-31 FACILITY: [facility] LOCATION: [facility] PHYSICIAN: Vanita Panda. 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  PROCEDURE PERFORMED:  1.  Left knee polyethylene liner exchange. 2.  Left knee revision of patella polyethylene.  FINDINGS:  Worn polyethylene central liner and worn polyethylene patella poly with stable metal components of the femoral and tibial components.  SURGEON:  Vanita Panda. Magnus Ivan, MD  ASSISTANT:  Richardean Canal, PA-C  ANESTHESIA: 1.  Left lower extremity adductor canal block. 2.  Spinal.  ANTIBIOTICS: 900 mg IV clindamycin.  BLOOD LOSS:  Less than 100 mL.  TOURNIQUET TIME:  Less than 1 hour.  COMPLICATIONS:  None.  INDICATIONS:  The patient is a 70 year old female who is 14 years status post a left total knee arthroplasty.  The left knee has been swelling and has felt somewhat unstable to her.  She has not had any fever, chills or evidence of infection.  This has  been going on for the last year or so.  Her x-ray shows well-seated femoral and tibial components, but definitely narrowing of the joint space, suggesting poly liner wear.  On physical exam, there is just mild effusion, but no redness, but there is  definitely play in the knee with stressing varus and valgus and drawer sign stressing.  At this point, we have recommended at the minimal an exchange for a poly liner with the possibility of revising all components depending on our intraoperative  findings and if we find any components being loose.  A 3-phase bone scan was obtained, suggests potential loosening of the tibial component, but it does appear well seated on  plain films.  DESCRIPTION OF PROCEDURE:  After informed consent was obtained, appropriate left knee was marked.  An adductor canal block was obtained in the left lower extremity in the holding room.  She was then brought to the operating room and placed supine on the  operating table and sat up on the operating table where spinal anesthesia was obtained.  She was laid in the supine position.  A Foley catheter was placed and a nonsterile tourniquet was placed around her upper left thigh.  Her left thigh, knee, leg,  ankle and foot were prepped and draped with DuraPrep and sterile drapes.  A timeout was called and she was identified correct patient, correct left knee before I opened up the knee.  I then assessed it again and again there is no redness, but just a very  faint effusion.  There is definitely instability on stressing the knee.  We then used Esmarch to wrap out the leg and tourniquet was inflated to 300 mm pressure.  I made a direct midline incision over the patella and carried this proximally and distally  through her old incision.  I dissected down the knee joint.  We carried out a medial parapatellar arthrotomy and found minimal effusion with no evidence of infection.  There was no evidence of metallosis either.  When we were able to invert the patella,  we could see there was actually a crack in the polyethylene of the patella and worn patella undersurface.  Thus we had to remove the patella in its entirety, knowing that we would need to drill for a new  patella and cement a new patella.  We then flexed  the knee and removed our previous polyethylene liner.  There was significant wear on the polyethylene.  We found the femoral component and tibial component to be stable with no evidence of loosening.  We removed some scar tissue from around the knee and  then we irrigated the knee with 3 liters normal saline solution using pulsatile lavage.  We then upsized her from a 9 mm poly to an 11 mm left  poly for her Zimmer knee.  Again, this was a Zimmer component and we went up to 11 mm thickness for this as it  relates to this prosthesis.  It was very stable with 11 mm polyethylene liner.  We then mixed our cement and after we drilled for the patellar holes, cemented for a size 32 patellar button.  Once that had hardened, we put the knee through range of  motion.  It was definitely very stable with varus and valgus stressing and had a negative drawer sign.  We then let the tourniquet down.  Hemostasis was obtained with electrocautery.  We irrigated the knee again and then closed the arthrotomy with  interrupted #1 Vicryl suture followed by 0 Vicryl to close the deep tissue and 2-0 Vicryl to close the subcutaneous tissue.  The skin was closed with staples.  Xeroform well-padded sterile dressing was applied.  She was taken to recovery room in stable  condition with all final counts being correct.  No complications noted.  Of note, Rexene Edison, PA-C, did assist during the entire case and assistance was crucial for facilitating every aspect of this case.   SHW D: 08/13/2021 4:29:03 pm T: 08/13/2021 11:32:00 pm  JOB: [id number]/ [id number]

## 2021-08-13 NOTE — H&P (Signed)
Patricia Travis is an 70 y.o. female.   Chief Complaint:   Left knee pain and swelling status post total knee arthroplasty. HPI: The patient is a very pleasant 70 year old female who underwent a direct primary total knee arthroplasty for her left knee in 2008.  She has had pain in that knee for some time now and occasional swelling with no redness.  It hurts with mobility.  This was a Zimmer cemented knee.  Recent plain film showed no evidence of loosening or other acute or nonacute findings.  A three-phase bone scan is suggesting loosening of the tibial component.  On plain films there is certainly a significant mount of narrowing between the femur and tibia components suggesting polyliner wear.  At this point we recommended a revision arthroplasty for the left knee and she understands this and agrees given her continued pain with left knee swelling.  This is definitely affecting her mobility, her actives day living and her quality of life.  Past Medical History:  Diagnosis Date  . Anemia    hx- high school  . Asthma   . Complication of anesthesia    problem being combative awakening from  hysterectomy   79 but had total knee 08 and did fine  . COPD (chronic obstructive pulmonary disease) (HCC)   . Diabetes mellitus   . Dyspnea   . GERD (gastroesophageal reflux disease)    occ  . Headache(784.0)    migraines hx - couple/month  . Hyperlipidemia   . Hypertension   . Osteoarthritis    fingers, hands  . Osteopenia   . PONV (postoperative nausea and vomiting)     Past Surgical History:  Procedure Laterality Date  . ABDOMINAL HYSTERECTOMY     total  . CHOLECYSTECTOMY  08/16/2012   Procedure: LAPAROSCOPIC CHOLECYSTECTOMY WITH INTRAOPERATIVE CHOLANGIOGRAM;  Surgeon: Wilmon Arms. Corliss Skains, MD;  Location: [facility] OR;  Service: General;  Laterality: N/A;  . KNEE ARTHROSCOPY  1990   L  . KNEE CARTILAGE SURGERY  1992   L  . OOPHORECTOMY    . TOTAL KNEE ARTHROPLASTY Left 06/01/2007    Family  History  Problem Relation Age of Onset  . Heart attack Father   . Coronary artery disease Father   . Heart disease Father   . Arthritis Mother        RA, and osteoarthritis  . Cancer Maternal Grandmother        breast  . Breast cancer Maternal Grandmother 50  . Breast cancer Maternal Aunt 70  . Diabetes Sister   . Diabetes Other        grandparents  . Cancer Paternal Grandfather    Social History:  reports that she quit smoking about 28 years ago. Her smoking use included cigarettes. She has a 37.50 pack-year smoking history. She has never used smokeless tobacco. She reports that she does not drink alcohol and does not use drugs.  Allergies:  Allergies  Allergen Reactions  . Penicillins Anaphylaxis  . Ace Inhibitors Cough  . Cortisone Acetate     REACTION: Makes pain worse  . Tetracyclines & Related Nausea And Vomiting    Medications Prior to Admission  Medication Sig Dispense Refill  . albuterol (VENTOLIN HFA) 108 (90 Base) MCG/ACT inhaler Inhale 1-2 puffs into the lungs every 6 (six) hours as needed for wheezing or shortness of breath.    Marland Kitchen amLODipine (NORVASC) 5 MG tablet Take 1 tablet (5 mg total) by mouth daily. 90 tablet 0  . gabapentin (NEURONTIN)  100 MG capsule Take 100 mg by mouth at bedtime.    . gabapentin (NEURONTIN) 600 MG tablet Take 600 mg by mouth at bedtime.    Marland Kitchen glimepiride (AMARYL) 4 MG tablet Take 2 mg by mouth 2 (two) times daily with a meal.  0  . losartan (COZAAR) 25 MG tablet Take 1 tablet (25 mg total) by mouth daily. 90 tablet 0  . meloxicam (MOBIC) 15 MG tablet Take 1 tablet (15 mg total) by mouth daily. 90 tablet 3  . metFORMIN (GLUCOPHAGE) 500 MG tablet Take 1,000 mg by mouth 2 (two) times daily with a meal.    . simvastatin (ZOCOR) 40 MG tablet Take 40 mg by mouth every evening.    . sitaGLIPtin (JANUVIA) 100 MG tablet Take 100 mg by mouth daily.    . traMADol (ULTRAM) 50 MG tablet Take 50 mg by mouth every 8 (eight) hours as needed for moderate  pain. For pain    . umeclidinium bromide (INCRUSE ELLIPTA) 62.5 MCG/INH AEPB Inhale 1 puff into the lungs daily as needed (shortness of breath).    . methylPREDNISolone (MEDROL DOSEPAK) 4 MG TBPK tablet 6 day dose pack - take as directed (Patient not taking: No sig reported) 21 tablet 0    Results for orders placed or performed during the hospital encounter of 08/13/21 (from the past 48 hour(s))  Glucose, capillary     Status: Abnormal   Collection Time: 08/13/21 12:10 PM  Result Value Ref Range   Glucose-Capillary 191 (H) 70 - 99 mg/dL    Comment: Glucose reference range applies only to samples taken after fasting for at least 8 hours.  ABO/Rh     Status: None   Collection Time: 08/13/21 12:55 PM  Result Value Ref Range   ABO/RH(D)      O POS Performed at [facility], [address] [place], [place] [address]    No results found.  Review of Systems  Musculoskeletal:  Positive for joint swelling.  All other systems reviewed and are negative.  Blood pressure (!) 152/62, pulse 82, temperature 99 F (37.2 C), temperature source Oral, resp. rate 16, height 5\' 5"  (1.651 m), weight 74.4 kg, SpO2 96 %. Physical Exam Vitals reviewed.  Constitutional:      Appearance: Normal appearance.  HENT:     Head: Normocephalic and atraumatic.  Eyes:     Extraocular Movements: Extraocular movements intact.     Pupils: Pupils are equal, round, and reactive to light.  Cardiovascular:     Rate and Rhythm: Normal rate.     Pulses: Normal pulses.  Pulmonary:     Effort: Pulmonary effort is normal.     Breath sounds: Normal breath sounds.  Abdominal:     Palpations: Abdomen is soft.  Musculoskeletal:     Cervical back: Normal range of motion and neck supple.     Left knee: Effusion present. Tenderness present over the medial joint line and lateral joint line.  Neurological:     Mental Status: She is alert and oriented to person, place, and time.  Psychiatric:        Behavior: Behavior  normal.     Assessment/Plan Left knee with polyliner wear 14 years status post a primary total knee arthroplasty  The patient understands thoroughly the goal of surgery today is try to get her left knee feeling better.  She understands it we will open the knee and perform a synovectomy and take out the old polyliner.  We  will assess the metal components and if they are stable we will be able to change out and upsize her poly-.  Obviously if there is any instability of either component we will revise the knee accordingly and revise the components accordingly.  We had a long and thorough discussion of the risk of acute blood loss anemia, nerve and vessel injury, fracture, DVT, implant failure and skin and soft tissue issues.  She understands her goals are hopefully improving her knee stability with decreased pain and decreased swelling over time and improve knee function.  Kathryne Hitch, MD 08/13/2021, 2:13 PM

## 2021-08-13 NOTE — Transfer of Care (Signed)
Immediate Anesthesia Transfer of Care Note  Patient: Patricia Travis  Procedure(s) Performed: LEFT TOTAL KNEE REVISION (Left: Knee)  Patient Location: PACU  Anesthesia Type:Spinal  Level of Consciousness: awake and patient cooperative  Airway & Oxygen Therapy: Patient Spontanous Breathing and Patient connected to nasal cannula oxygen  Post-op Assessment: Report given to RN, Post -op Vital signs reviewed and stable and Patient moving all extremities  Post vital signs: Reviewed and stable  Last Vitals:  Vitals Value Taken Time  BP 118/57 08/13/21 1651  Temp    Pulse 68 08/13/21 1653  Resp 21 08/13/21 1653  SpO2 96 % 08/13/21 1653  Vitals shown include unvalidated device data.  Last Pain:  Vitals:   08/13/21 1405  TempSrc:   PainSc: 0-No pain      Patients Stated Pain Goal: 3 (08/13/21 1253)  Complications: No notable events documented.

## 2021-08-13 NOTE — Brief Op Note (Signed)
08/13/2021  4:31 PM  PATIENT:  Patricia Travis  70 y.o. female  PRE-OPERATIVE DIAGNOSIS:  loosening prosthesis left total knee arthroplasty  POST-OPERATIVE DIAGNOSIS:  loosening prosthesis left total knee arthroplasty  PROCEDURE:  Procedure(s): LEFT TOTAL KNEE REVISION (Left) Revision of poly liner and patella component  SURGEON:  Surgeon(s) and Role:    Kathryne Hitch, MD - Primary  PHYSICIAN ASSISTANT:  Rexene Edison, PA-C  ANESTHESIA:   regional and spinal  COUNTS:  YES  TOURNIQUET:   Total Tourniquet Time Documented: Thigh (Left) - 51 minutes Total: Thigh (Left) - 51 minutes   DICTATION: .Other Dictation: Dictation Number [id number]  PLAN OF CARE: Admit to inpatient   PATIENT DISPOSITION:  PACU - hemodynamically stable.   Delay start of Pharmacological VTE agent (>24hrs) due to surgical blood loss or risk of bleeding: no

## 2021-08-13 NOTE — Anesthesia Procedure Notes (Signed)
Procedure Name: MAC Date/Time: 08/13/2021 2:53 PM Performed by: Moshe Salisbury, CRNA Pre-anesthesia Checklist: Patient identified, Emergency Drugs available, Suction available, Patient being monitored and Timeout performed Patient Re-evaluated:Patient Re-evaluated prior to induction Oxygen Delivery Method: Nasal cannula Placement Confirmation: positive ETCO2 Dental Injury: Teeth and Oropharynx as per pre-operative assessment

## 2021-08-13 NOTE — Anesthesia Procedure Notes (Signed)
Anesthesia Regional Block: Adductor canal block   Pre-Anesthetic Checklist: , timeout performed,  Correct Patient, Correct Site, Correct Laterality,  Correct Procedure, Correct Position, site marked,  Risks and benefits discussed,  Surgical consent,  Pre-op evaluation,  At surgeon's request and post-op pain management  Laterality: Left  Prep: chloraprep       Needles:  Injection technique: Single-shot  Needle Type: Echogenic Needle     Needle Length: 9cm  Needle Gauge: 21     Additional Needles:   Narrative:  Start time: 08/13/2021 1:52 PM End time: 08/13/2021 2:02 PM Injection made incrementally with aspirations every 5 mL.  Performed by: Personally  Anesthesiologist: Achille Rich, MD  Additional Notes: Pt tolerated the procedure well.

## 2021-08-13 NOTE — Anesthesia Procedure Notes (Signed)
Spinal  Start time: 08/13/2021 2:55 PM End time: 08/13/2021 3:05 PM Staffing Performed: anesthesiologist  Anesthesiologist: Dorris Singh, MD Preanesthetic Checklist Completed: patient identified, IV checked, site marked, risks and benefits discussed, surgical consent, monitors and equipment checked, pre-op evaluation and timeout performed Spinal Block Patient position: sitting Prep: ChloraPrep Patient monitoring: heart rate, cardiac monitor, continuous pulse ox and blood pressure Location: L3-4 Injection technique: single-shot Needle Needle type: Pencan and Introducer  Needle gauge: 24 G Assessment Sensory level: T12 Additional Notes Clear CSF. Spinal marcaine 0.75% 1.8cc Pt tolerated procedure well Dr. Chilton Si

## 2021-08-13 NOTE — Anesthesia Preprocedure Evaluation (Addendum)
Anesthesia Evaluation  Patient identified by MRN, date of birth, ID band Patient awake    Reviewed: Allergy & Precautions, H&P , NPO status , Patient's Chart, lab work & pertinent test results, reviewed documented beta blocker date and time   History of Anesthesia Complications (+) PONV and history of anesthetic complications  Airway Mallampati: I  TM Distance: >3 FB Neck ROM: Full    Dental  (+) Dental Advisory Given, Chipped, Poor Dentition   Pulmonary shortness of breath and with exertion, asthma , COPD,  COPD inhaler, former smoker,    breath sounds clear to auscultation       Cardiovascular hypertension, Pt. on medications  Rhythm:Regular Rate:Normal     Neuro/Psych  Headaches, Peripheral neuropathy  Neuromuscular disease    GI/Hepatic Neg liver ROS, GERD  ,  Endo/Other  diabetes, Poorly Controlled, Type 2, Oral Hypoglycemic AgentsHyperlipidemia   Renal/GU negative Renal ROS  negative genitourinary   Musculoskeletal  (+) Arthritis , Osteoarthritis,  S/P Left TKR with loose implant Osteopenia   Abdominal   Peds  Hematology  (+) anemia ,   Anesthesia Other Findings   Reproductive/Obstetrics                            Anesthesia Physical  Anesthesia Plan  ASA: 3  Anesthesia Plan: Spinal   Post-op Pain Management:    Induction: Intravenous  PONV Risk Score and Plan: 4 or greater and Treatment may vary due to age or medical condition, Ondansetron and Propofol infusion  Airway Management Planned: Oral ETT, Natural Airway and Simple Face Mask  Additional Equipment:   Intra-op Plan:   Post-operative Plan:   Informed Consent: I have reviewed the patients History and Physical, chart, labs and discussed the procedure including the risks, benefits and alternatives for the proposed anesthesia with the patient or authorized representative who has indicated his/her understanding and  acceptance.     Dental advisory given  Plan Discussed with: CRNA and Anesthesiologist  Anesthesia Plan Comments:         Anesthesia Quick Evaluation

## 2021-08-14 LAB — BASIC METABOLIC PANEL
Anion gap: 11 (ref 5–15)
BUN: 11 mg/dL (ref 8–23)
CO2: 24 mmol/L (ref 22–32)
Calcium: 8.5 mg/dL — ABNORMAL LOW (ref 8.9–10.3)
Chloride: 101 mmol/L (ref 98–111)
Creatinine, Ser: 0.65 mg/dL (ref 0.44–1.00)
GFR, Estimated: >60 mL/min (ref >60)
Glucose, Bld: 281 mg/dL — ABNORMAL HIGH (ref 70–99)
Potassium: 4.5 mmol/L (ref 3.5–5.1)
Sodium: 136 mmol/L (ref 135–145)

## 2021-08-14 LAB — CBC
HCT: 36.2 % (ref 36.0–46.0)
Hemoglobin: 12 g/dL (ref 12.0–15.0)
MCH: 30.1 pg (ref 26.0–34.0)
MCHC: 33.1 g/dL (ref 30.0–36.0)
MCV: 90.7 fL (ref 80.0–100.0)
Platelets: 193 10*3/uL (ref 150–400)
RBC: 3.99 MIL/uL (ref 3.87–5.11)
RDW: 12.6 % (ref 11.5–15.5)
WBC: 15.3 10*3/uL — ABNORMAL HIGH (ref 4.0–10.5)
nRBC: 0 % (ref 0.0–0.2)

## 2021-08-14 LAB — GLUCOSE, CAPILLARY
Glucose-Capillary: 261 mg/dL — ABNORMAL HIGH (ref 70–99)
Glucose-Capillary: 225 mg/dL — ABNORMAL HIGH (ref 70–99)
Glucose-Capillary: 253 mg/dL — ABNORMAL HIGH (ref 70–99)
Glucose-Capillary: 279 mg/dL — ABNORMAL HIGH (ref 70–99)

## 2021-08-14 MED ORDER — KETOROLAC TROMETHAMINE 15 MG/ML IJ SOLN
15.0000 mg | Freq: Four times a day (QID) | INTRAMUSCULAR | Status: DC | PRN
  Administered 2021-08-14 – 2021-08-15 (×3): 15 mg via INTRAVENOUS
  Filled 2021-08-14 (×3): qty 1

## 2021-08-14 MED ORDER — INSULIN ASPART 100 UNIT/ML IJ SOLN: 0.0000 [IU] | Freq: Every day | INTRAMUSCULAR | Status: DC

## 2021-08-14 MED ORDER — TRAMADOL HCL 50 MG PO TABS
100.0000 mg | ORAL_TABLET | Freq: Four times a day (QID) | ORAL | Status: DC | PRN
  Administered 2021-08-14: 50 mg via ORAL
  Administered 2021-08-15: 100 mg via ORAL
  Filled 2021-08-14 (×2): qty 2

## 2021-08-14 MED ORDER — INSULIN ASPART 100 UNIT/ML IJ SOLN
0.0000 [IU] | Freq: Three times a day (TID) | INTRAMUSCULAR | Status: DC
  Administered 2021-08-14 (×2): 5 [IU] via SUBCUTANEOUS
  Administered 2021-08-15: 3 [IU] via SUBCUTANEOUS

## 2021-08-14 NOTE — Progress Notes (Signed)
Inpatient Diabetes Program Recommendations  AACE/ADA: New Consensus Statement on Inpatient Glycemic Control (2015)  Target Ranges:  Prepandial:   less than 140 mg/dL      Peak postprandial:   less than 180 mg/dL (1-2 hours)      Critically ill patients:  140 - 180 mg/dL   Lab Results  Component Value Date   GLUCAP 148 (H) 08/13/2021   HGBA1C 6.5 09/08/2011    Review of Glycemic Control Results for Patricia Travis, Patricia Travis (MRN [id number]) as of 08/14/2021 09:44  Ref. Range 08/13/2021 12:10 08/13/2021 16:52  Glucose-Capillary Latest Ref Range: 70 - 99 mg/dL 220 (H) 254 (H)   Diabetes history: Type 2 DM Outpatient Diabetes medications: Januvia 100 mg QD, Amaryl 2 mg bid, Metformin 1000 mg bid Current orders for Inpatient glycemic control: Amaryl 2 mg bid, Metformin 1000 mg bid, Tradjenta 5 mg QD  Inpatient Diabetes Program Recommendations:   Consider adding Novolog 0-9 units TID & HS.   Thanks, Lujean Rave, MSN, RNC-OB Diabetes Coordinator [phone] (8a-5p)

## 2021-08-14 NOTE — Plan of Care (Signed)

## 2021-08-14 NOTE — Discharge Instructions (Signed)

## 2021-08-14 NOTE — Anesthesia Postprocedure Evaluation (Signed)
Anesthesia Post Note  Patient: Patricia Travis  Procedure(s) Performed: LEFT TOTAL KNEE REVISION (Left: Knee)     Patient location during evaluation: PACU Anesthesia Type: Spinal Level of consciousness: awake Pain management: pain level controlled Vital Signs Assessment: post-procedure vital signs reviewed and stable Respiratory status: spontaneous breathing Cardiovascular status: stable Postop Assessment: no apparent nausea or vomiting Anesthetic complications: no   No notable events documented.  Last Vitals:  Vitals:   08/14/21 0811 08/14/21 1204  BP: (!) 147/72 (!) 134/56  Pulse: 80 86  Resp: 18 20  Temp: 36.5 C 37.1 C  SpO2: 94% 95%    Last Pain:  Vitals:   08/14/21 1255  TempSrc:   PainSc: Asleep                 Letroy Vazguez

## 2021-08-14 NOTE — Progress Notes (Signed)
Subjective: 1 Day Post-Op Procedure(s) (LRB): LEFT TOTAL KNEE REVISION (Left) Patient reports pain as moderate.    Objective: Vital signs in last 24 hours: Temp:  [97 F (36.1 C)-99 F (37.2 C)] 97.7 F (36.5 C) (09/21 0559) Pulse Rate:  [56-88] 77 (09/21 0559) Resp:  [12-18] 16 (09/21 0559) BP: (118-165)/(57-77) 148/66 (09/21 0559) SpO2:  [91 %-100 %] 95 % (09/21 0559) Weight:  [74.4 kg] 74.4 kg (09/20 1214)  Intake/Output from previous day: 09/20 0701 - 09/21 0700 In: 1470 [P.O.:120; I.V.:1200; IV Piggyback:150] Out: 325 [Urine:275; Emesis/NG output:50] Intake/Output this shift: No intake/output data recorded.  Recent Labs    08/14/21 0055  HGB 12.0   Recent Labs    08/14/21 0055  WBC 15.3*  RBC 3.99  HCT 36.2  PLT 193   Recent Labs    08/14/21 0055  NA 136  K 4.5  CL 101  CO2 24  BUN 11  CREATININE 0.65  GLUCOSE 281*  CALCIUM 8.5*   No results for input(s): LABPT, INR in the last 72 hours.  Sensation intact distally Intact pulses distally Dorsiflexion/Plantar flexion intact Incision: dressing C/D/I Compartment soft   Assessment/Plan: 1 Day Post-Op Procedure(s) (LRB): LEFT TOTAL KNEE REVISION (Left) Up with therapy      Kathryne Hitch 08/14/2021, 7:41 AM

## 2021-08-14 NOTE — Progress Notes (Signed)
Physical Therapy Evaluation Patient Details Name: Patricia Travis MRN: [id number] DOB: 1950-12-01 Today's Date: 08/14/2021  History of Present Illness  Pt admit 9/20 for left TKA revision.  Previous left TKA in 2008.  PMH: COPD, DM,HTN, HLD, Asthma  Clinical Impression  Pt admitted with above diagnosis. Pt was able to ambulate with RW with min guard assist and cues.  Pt slightly dizzy initially but that resolved.  Should progress well.  Pt currently with functional limitations due to the deficits listed below (see PT Problem List). Pt will benefit from skilled PT to increase their independence and safety with mobility to allow discharge to the venue listed below.          Recommendations for follow up therapy are one component of a multi-disciplinary discharge planning process, led by the attending physician.  Recommendations may be updated based on patient status, additional functional criteria and insurance authorization.  Follow Up Recommendations Follow surgeon's recommendation for DC plan and follow-up therapies    Equipment Recommendations  None recommended by PT    Recommendations for Other Services       Precautions / Restrictions Precautions Precautions: Fall;Knee Restrictions Weight Bearing Restrictions: Yes LLE Weight Bearing: Weight bearing as tolerated      Mobility  Bed Mobility Overal bed mobility: Needs Assistance Bed Mobility: Supine to Sit     Supine to sit: Min assist     General bed mobility comments: Needed assist for left LE movement needing incr time due to pain.    Transfers Overall transfer level: Needs assistance Equipment used: Rolling walker (2 wheeled) Transfers: Sit to/from Stand Sit to Stand: Min guard         General transfer comment: cues for hand placement. Able to stand without assist. Report dizziness upon intiiatl stand but it resolved the longer pt was up.  Ambulation/Gait Ambulation/Gait assistance: Min guard Gait  Distance (Feet): 45 Feet Assistive device: Rolling walker (2 wheeled) Gait Pattern/deviations: Step-to pattern;Decreased step length - left;Decreased weight shift to left;Antalgic   Gait velocity interpretation: <1.31 ft/sec, indicative of household ambulator General Gait Details: Pt able to ambulate to door and back with cues for sequencing steps and rW. No LOB with RW.  Stairs            Wheelchair Mobility    Modified Rankin (Stroke Patients Only)       Balance Overall balance assessment: Needs assistance Sitting-balance support: No upper extremity supported;Feet supported Sitting balance-Leahy Scale: Fair     Standing balance support: Bilateral upper extremity supported;During functional activity Standing balance-Leahy Scale: Poor Standing balance comment: relies on UE support for balance on RW                             Pertinent Vitals/Pain Pain Assessment: Faces Faces Pain Scale: Hurts even more Pain Location: left knee Pain Descriptors / Indicators: Aching;Grimacing;Guarding;Discomfort Pain Intervention(s): Limited activity within patient's tolerance;Monitored during session;Repositioned;Premedicated before session;Ice applied    Home Living Family/patient expects to be discharged to:: Private residence Living Arrangements: Spouse/significant other Available Help at Discharge: Family;Available 24 hours/day (husband and sister) Type of Home: House Home Access: Stairs to enter Entrance Stairs-Rails: Right;Left;Can reach both Entrance Stairs-Number of Steps: 3 Home Layout: One level Home Equipment: Walker - 2 wheels;Shower seat;Grab bars - tub/shower;Hand held shower head;Grab bars - toilet      Prior Function Level of Independence: Independent  Hand Dominance   Dominant Hand: Right    Extremity/Trunk Assessment   Upper Extremity Assessment Upper Extremity Assessment: Defer to OT evaluation    Lower Extremity  Assessment Lower Extremity Assessment: LLE deficits/detail LLE: Unable to fully assess due to pain    Cervical / Trunk Assessment Cervical / Trunk Assessment: Normal  Communication   Communication: No difficulties  Cognition Arousal/Alertness: Awake/alert Behavior During Therapy: WFL for tasks assessed/performed Overall Cognitive Status: Within Functional Limits for tasks assessed                                        General Comments      Exercises Total Joint Exercises Ankle Circles/Pumps: AROM;Both;5 reps;Supine Quad Sets: AROM;Both;10 reps;Supine Towel Squeeze: AROM;Both;10 reps;Supine Heel Slides: Left;5 reps;Supine;AAROM Straight Leg Raises: AAROM;Left;10 reps;Supine Long Arc Quad: AAROM;Left;5 reps;Seated Knee Flexion: AAROM;Left;5 reps;Seated Goniometric ROM: 5-50   Assessment/Plan    PT Assessment Patient needs continued PT services  PT Problem List Decreased activity tolerance;Decreased balance;Decreased mobility;Decreased knowledge of use of DME;Decreased safety awareness;Decreased knowledge of precautions;Decreased range of motion;Decreased strength;Pain       PT Treatment Interventions DME instruction;Gait training;Functional mobility training;Therapeutic activities;Therapeutic exercise;Balance training;Patient/family education;Stair training    PT Goals (Current goals can be found in the Care Plan section)  Acute Rehab PT Goals Patient Stated Goal: to gohome PT Goal Formulation: With patient Time For Goal Achievement: 08/28/21 Potential to Achieve Goals: Good    Frequency Min 6X/week   Barriers to discharge        Co-evaluation               AM-PAC PT "6 Clicks" Mobility  Outcome Measure Help needed turning from your back to your side while in a flat bed without using bedrails?: A Little Help needed moving from lying on your back to sitting on the side of a flat bed without using bedrails?: A Little Help needed moving to  and from a bed to a chair (including a wheelchair)?: A Little Help needed standing up from a chair using your arms (e.g., wheelchair or bedside chair)?: A Little Help needed to walk in hospital room?: A Little Help needed climbing 3-5 steps with a railing? : A Little 6 Click Score: 18    End of Session Equipment Utilized During Treatment: Gait belt Activity Tolerance: Patient limited by fatigue Patient left: in chair;with call bell/phone within reach;with chair alarm set Nurse Communication: Mobility status PT Visit Diagnosis: Muscle weakness (generalized) (M62.81);Pain Pain - Right/Left: Left Pain - part of body: Knee    Time: 1761-6073 PT Time Calculation (min) (ACUTE ONLY): 26 min   Charges:   PT Evaluation $PT Eval Moderate Complexity: 1 Mod PT Treatments $Gait Training: 8-22 mins        Yani Coventry M,PT Acute Rehab Services [phone] (pager)   Bevelyn Buckles 08/14/2021, 1:36 PM

## 2021-08-14 NOTE — TOC Initial Note (Signed)
Transition of Care [facility]) - Initial/Assessment Note    Patient Details  Name: Patricia Travis MRN: [id number] Date of Birth: 1951-07-05  [facility]) CM/SW Contact:    Kingsley Plan, RN Phone Number: 08/14/2021, 10:13 AM  Clinical Narrative:                  Spoke to patient at bedside. Patient from home with significant other. Patient already has walker and 3 in1 at home from prior surgery.   MD office has arranged home health services with [facility], patient in agreement.  Expected Discharge Plan: Home w Home Health Services Barriers to Discharge: No Barriers Identified   Patient Goals and CMS Choice Patient states their goals for this hospitalization and ongoing recovery are:: to return to home CMS Medicare.gov Compare Post Acute Care list provided to:: Patient Choice offered to / list presented to : Patient  Expected Discharge Plan and Services Expected Discharge Plan: Home w Home Health Services In-house Referral: NA Discharge Planning Services: CM Consult Post Acute Care Choice: Home Health Living arrangements for the past 2 months: Single Family Home                 DME Arranged: N/A         HH Arranged: PT HH Agency: [facility] Date HH Agency Contacted: 08/14/21 Time HH Agency Contacted: 1013 Representative spoke with at [facility] Agency: Stacie  Prior Living Arrangements/Services Living arrangements for the past 2 months: Single Family Home Lives with:: Significant Other Patient language and need for interpreter reviewed:: Yes Do you feel safe going back to the place where you live?: Yes      Need for Family Participation in Patient Care: Yes (Comment) Care giver support system in place?: Yes (comment) Current home services: DME Criminal Activity/Legal Involvement Pertinent to Current Situation/Hospitalization: No - Comment as needed  Activities of Daily Living Home Assistive Devices/Equipment: Dan Humphreys (specify type) ADL  Screening (condition at time of admission) Patient's cognitive ability adequate to safely complete daily activities?: Yes Is the patient deaf or have difficulty hearing?: No Does the patient have difficulty seeing, even when wearing glasses/contacts?: No Does the patient have difficulty concentrating, remembering, or making decisions?: No Patient able to express need for assistance with ADLs?: Yes Does the patient have difficulty dressing or bathing?: No Independently performs ADLs?: Yes (appropriate for developmental age) Does the patient have difficulty walking or climbing stairs?: Yes Weakness of Legs: Left Weakness of Arms/Hands: None  Permission Sought/Granted   Permission granted to share information with : No              Emotional Assessment Appearance:: Appears stated age Attitude/Demeanor/Rapport: Engaged Affect (typically observed): Accepting Orientation: : Oriented to Self, Oriented to Place, Oriented to  Time, Oriented to Situation Alcohol / Substance Use: Not Applicable Psych Involvement: No (comment)  Admission diagnosis:  Status post revision of total replacement of left knee [Z96.652] Patient Active Problem List   Diagnosis Date Noted   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) 08/13/2021   Status post revision of total replacement of left knee 08/13/2021   Unilateral primary osteoarthritis, right knee 01/10/2020   Left knee pain 12/31/2015   Chronic cholecystitis 08/05/2012   Left arm pain 09/25/2011   Left arm numbness 09/25/2011   Tinnitus 09/12/2011   MICROALBUMINURIA 01/03/2011   VITAMIN B12 DEFICIENCY 03/13/2010   PERIPHERAL NEUROPATHY 03/12/2010   COPD, MILD 08/23/2009   CARPAL TUNNEL SYNDROME, LEFT 05/08/2009  ARTHRITIS, CARPOMETACARPAL JOINT 05/08/2009   DIABETES MELLITUS, TYPE II 08/02/2008   HYPERLIPIDEMIA 02/07/2007   MIGRAINE, CLASSICAL W/O INTRACTABLE MIGRAINE 02/07/2007   HYPERTENSION 02/07/2007   GERD 02/07/2007    OSTEOARTHRITIS 02/07/2007   KNEE PAIN, LEFT, CHRONIC 02/07/2007   OSTEOPENIA 02/07/2007   PCP:  Clayborn Heron, MD Pharmacy:   [phone] [facility] - [place], [place] - [address] [place] [address] Phone: [phone] Fax: [phone]  [facility] - [place], [place] - [address] [place] [address] Phone: [phone] Fax: [phone]  [facility] - [place], [place] - [address] [place] [address] Phone: [phone] Fax: [phone]     Social Determinants of Health (SDOH) Interventions    Readmission Risk Interventions No flowsheet data found.

## 2021-08-15 LAB — GLUCOSE, CAPILLARY
Glucose-Capillary: 242 mg/dL — ABNORMAL HIGH (ref 70–99)
Glucose-Capillary: 243 mg/dL — ABNORMAL HIGH (ref 70–99)

## 2021-08-15 MED ORDER — TRAMADOL HCL 50 MG PO TABS: 50.0000 mg | ORAL_TABLET | Freq: Four times a day (QID) | ORAL | 0 refills | Status: DC | PRN

## 2021-08-15 MED ORDER — ONDANSETRON 4 MG PO TBDP: 4.0000 mg | ORAL_TABLET | Freq: Three times a day (TID) | ORAL | 0 refills | Status: DC | PRN

## 2021-08-15 MED ORDER — ASPIRIN 81 MG PO CHEW: 81.0000 mg | CHEWABLE_TABLET | Freq: Two times a day (BID) | ORAL | 0 refills | Status: DC

## 2021-08-15 MED ORDER — METHOCARBAMOL 500 MG PO TABS: 500.0000 mg | ORAL_TABLET | Freq: Four times a day (QID) | ORAL | 1 refills | Status: DC | PRN

## 2021-08-15 NOTE — Progress Notes (Signed)
Physical Therapy Treatment Patient Details Name: Patricia Travis MRN: [id number] DOB: 1950-12-01 Today's Date: 08/15/2021   History of Present Illness Pt admit 9/20 for left TKA revision.  Previous left TKA in 2008.  PMH: COPD, DM,HTN, HLD, Asthma    PT Comments    Pt admitted with above diagnosis. Pt was able to ambulate with min guard assist to supervision of all aspects of mobility. Pt education complete and pt going home today.  Pt currently with functional limitations due t o ROM and strength deficits. Pt will benefit from skilled PT to increase their independence and safety with mobility to allow discharge to the venue listed below.      Recommendations for follow up therapy are one component of a multi-disciplinary discharge planning process, led by the attending physician.  Recommendations may be updated based on patient status, additional functional criteria and insurance authorization.  Follow Up Recommendations  Follow surgeon's recommendation for DC plan and follow-up therapies     Equipment Recommendations  None recommended by PT    Recommendations for Other Services       Precautions / Restrictions Precautions Precautions: Fall;Knee Restrictions Weight Bearing Restrictions: Yes LLE Weight Bearing: Weight bearing as tolerated     Mobility  Bed Mobility Overal bed mobility: Needs Assistance Bed Mobility: Supine to Sit     Supine to sit: Supervision     General bed mobility comments: No assist needed    Transfers Overall transfer level: Needs assistance Equipment used: Rolling walker (2 wheeled) Transfers: Sit to/from Stand Sit to Stand: Supervision         General transfer comment: No assist needed and good technique  Ambulation/Gait Ambulation/Gait assistance: Modified independent (Device/Increase time);Supervision Gait Distance (Feet): 700 Feet Assistive device: Rolling walker (2 wheeled) Gait Pattern/deviations: Step-to pattern;Decreased  step length - left;Decreased weight shift to left;Antalgic   Gait velocity interpretation: <1.8 ft/sec, indicate of risk for recurrent falls General Gait Details: Pt ambulated to gym on 5N to practice steps. No LOB with RW.   Stairs Stairs: Yes Stairs assistance: Supervision Stair Management: Two rails;Step to pattern;Forwards Number of Stairs: 2 General stair comments: Pt was able to go up and down 2 steps with rails without assist.   Wheelchair Mobility    Modified Rankin (Stroke Patients Only)       Balance Overall balance assessment: Needs assistance Sitting-balance support: No upper extremity supported;Feet supported Sitting balance-Leahy Scale: Fair     Standing balance support: Bilateral upper extremity supported;During functional activity Standing balance-Leahy Scale: Poor Standing balance comment: relies on UE support for balance on RW                            Cognition Arousal/Alertness: Awake/alert Behavior During Therapy: WFL for tasks assessed/performed Overall Cognitive Status: Within Functional Limits for tasks assessed                                        Exercises Total Joint Exercises Ankle Circles/Pumps: AROM;Both;5 reps;Supine Quad Sets: AROM;Both;10 reps;Supine Towel Squeeze: AROM;Both;10 reps;Supine Heel Slides: Left;5 reps;Supine;AAROM Straight Leg Raises: AAROM;Left;10 reps;Supine Long Arc Quad: AAROM;Left;5 reps;Seated Knee Flexion: AAROM;Left;5 reps;Seated Goniometric ROM: 2-55    General Comments        Pertinent Vitals/Pain Pain Assessment: Faces Faces Pain Scale: Hurts even more Pain Location: left knee Pain Descriptors / Indicators: Aching;Grimacing;Guarding;Discomfort Pain Intervention(s):  Limited activity within patient's tolerance;Monitored during session;Repositioned;Premedicated before session    Home Living                      Prior Function            PT Goals (current  goals can now be found in the care plan section) Acute Rehab PT Goals Patient Stated Goal: to gohome Progress towards PT goals: Progressing toward goals    Frequency    Min 6X/week      PT Plan Current plan remains appropriate    Co-evaluation              AM-PAC PT "6 Clicks" Mobility   Outcome Measure  Help needed turning from your back to your side while in a flat bed without using bedrails?: None Help needed moving from lying on your back to sitting on the side of a flat bed without using bedrails?: None Help needed moving to and from a bed to a chair (including a wheelchair)?: None Help needed standing up from a chair using your arms (e.g., wheelchair or bedside chair)?: None Help needed to walk in hospital room?: A Little Help needed climbing 3-5 steps with a railing? : A Little 6 Click Score: 22    End of Session Equipment Utilized During Treatment: Gait belt Activity Tolerance: Patient limited by fatigue Patient left: in chair;with call bell/phone within reach;with chair alarm set Nurse Communication: Mobility status PT Visit Diagnosis: Muscle weakness (generalized) (M62.81);Pain Pain - Right/Left: Left Pain - part of body: Knee     Time: 0902-0925 PT Time Calculation (min) (ACUTE ONLY): 23 min  Charges:  $Gait Training: 8-22 mins $Therapeutic Exercise: 8-22 mins                     Patricia Travis,PT Acute Rehab Services [phone] (pager)    Patricia Travis 08/15/2021, 11:07 AM

## 2021-08-15 NOTE — Progress Notes (Signed)
Nursing DC note Patient alert and oriented x4, verbalized understanding of dc instructions. Patients sister Wille Celeste will take patient home around 3 pm. Janie aware patient is in Dc lounge. Piv dcd. Site unremarkable. All belongings given to patient. Report given to Ideal , NT on [employer].

## 2021-08-15 NOTE — Progress Notes (Signed)
Patient ID: Patricia Travis, female   DOB: 1951-08-21, 70 y.o.   MRN: [id number] Doing well overall.  Knee stable.  Can be discharged to home today.

## 2021-08-15 NOTE — Discharge Summary (Signed)
Patient ID: Patricia Travis MRN: [id number] DOB/AGE: Apr 14, 1951 70 y.o.  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) Active Problems:   Status post revision of total replacement of left knee   Discharge Diagnoses:  Same  Past Medical History:  Diagnosis Date   Anemia    hx- high school   Asthma    Complication of anesthesia    problem being combative awakening from  hysterectomy   79 but had total knee 08 and did fine   COPD (chronic obstructive pulmonary disease) (HCC)    Diabetes mellitus    Dyspnea    GERD (gastroesophageal reflux disease)    occ   Headache(784.0)    migraines hx - couple/month   Hyperlipidemia    Hypertension    Osteoarthritis    fingers, hands   Osteopenia    PONV (postoperative nausea and vomiting)     Surgeries: Procedure(s): LEFT TOTAL KNEE REVISION on 08/13/2021   Consultants:   Discharged Condition: Improved  Hospital Course: Patricia Travis is an 70 y.o. female who was admitted 08/13/2021 for operative treatment of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). Patient has severe unremitting pain that affects sleep, daily activities, and work/hobbies. After pre-op clearance the patient was taken to the operating room on 08/13/2021 and underwent  Procedure(s): LEFT TOTAL KNEE REVISION.    Patient was given perioperative antibiotics:  Anti-infectives (From admission, onward)    Start     Dose/Rate Route Frequency Ordered Stop   08/13/21 2100  clindamycin (CLEOCIN) IVPB 600 mg        600 mg 100 mL/hr over 30 Minutes Intravenous Every 6 hours 08/13/21 1822 08/14/21 0423   08/13/21 1300  clindamycin (CLEOCIN) IVPB 900 mg        900 mg 100 mL/hr over 30 Minutes Intravenous On call to O.R. 08/13/21 1131 08/13/21 1508        Patient was given sequential compression devices, early ambulation, and chemoprophylaxis to prevent  DVT.  Patient benefited maximally from hospital stay and there were no complications.    Recent vital signs: Patient Vitals for the past 24 hrs:  BP Temp Temp src Pulse Resp SpO2  08/15/21 0813 132/64 98.6 F (37 C) Oral 85 18 93 %  08/15/21 0528 (!) 146/64 98.1 F (36.7 C) Oral 91 16 96 %  08/14/21 2038 (!) 135/59 98.5 F (36.9 C) Oral 86 16 97 %  08/14/21 1728 (!) 131/58 98.5 F (36.9 C) Oral 81 18 95 %  08/14/21 1204 (!) 134/56 98.8 F (37.1 C) Oral 86 20 95 %     Recent laboratory studies:  Recent Labs    08/14/21 0055  WBC 15.3*  HGB 12.0  HCT 36.2  PLT 193  NA 136  K 4.5  CL 101  CO2 24  BUN 11  CREATININE 0.65  GLUCOSE 281*  CALCIUM 8.5*     Discharge Medications:   Allergies as of 08/15/2021       Reactions   Penicillins Anaphylaxis   Ace Inhibitors Cough   Cortisone Acetate    REACTION: Makes pain worse   Tetracyclines & Related Nausea And Vomiting        Medication List     TAKE these medications    albuterol 108 (90 Base) MCG/ACT inhaler Commonly known as: VENTOLIN HFA Inhale 1-2 puffs into the lungs every 6 (six) hours as needed for wheezing or  shortness of breath.   amLODipine 5 MG tablet Commonly known as: NORVASC Take 1 tablet (5 mg total) by mouth daily.   aspirin 81 MG chewable tablet Chew 1 tablet (81 mg total) by mouth 2 (two) times daily.   gabapentin 600 MG tablet Commonly known as: NEURONTIN Take 600 mg by mouth at bedtime.   gabapentin 100 MG capsule Commonly known as: NEURONTIN Take 100 mg by mouth at bedtime.   glimepiride 4 MG tablet Commonly known as: AMARYL Take 2 mg by mouth 2 (two) times daily with a meal.   Incruse Ellipta 62.5 MCG/INH Aepb Generic drug: umeclidinium bromide Inhale 1 puff into the lungs daily as needed (shortness of breath).   losartan 25 MG tablet Commonly known as: COZAAR Take 1 tablet (25 mg total) by mouth daily.   meloxicam 15 MG tablet Commonly known as: MOBIC Take 1 tablet (15  mg total) by mouth daily.   metFORMIN 500 MG tablet Commonly known as: GLUCOPHAGE Take 1,000 mg by mouth 2 (two) times daily with a meal.   methocarbamol 500 MG tablet Commonly known as: ROBAXIN Take 1 tablet (500 mg total) by mouth every 6 (six) hours as needed for muscle spasms.   methylPREDNISolone 4 MG Tbpk tablet Commonly known as: MEDROL DOSEPAK 6 day dose pack - take as directed   ondansetron 4 MG disintegrating tablet Commonly known as: Zofran ODT Take 1 tablet (4 mg total) by mouth every 8 (eight) hours as needed for nausea or vomiting.   simvastatin 40 MG tablet Commonly known as: ZOCOR Take 40 mg by mouth every evening.   sitaGLIPtin 100 MG tablet Commonly known as: JANUVIA Take 100 mg by mouth daily.   traMADol 50 MG tablet Commonly known as: ULTRAM Take 1-2 tablets (50-100 mg total) by mouth every 6 (six) hours as needed for moderate pain. What changed: You were already taking a medication with the same name, and this prescription was added. Make sure you understand how and when to take each.   traMADol 50 MG tablet Commonly known as: ULTRAM Take 50 mg by mouth every 8 (eight) hours as needed for moderate pain. For pain What changed: Another medication with the same name was added. Make sure you understand how and when to take each.               Durable Medical Equipment  (From admission, onward)           Start     Ordered   08/13/21 1823  DME 3 n 1  Once        08/13/21 1822   08/13/21 1823  DME Walker rolling  Once       Question Answer Comment  Walker: With 5 Inch Wheels   Patient needs a walker to treat with the following condition Status post revision of total replacement of left knee      08/13/21 1822            Diagnostic Studies: DG Knee Left Port  Result Date: 08/13/2021 CLINICAL DATA:  Status post revision of total knee replacement. EXAM: PORTABLE LEFT KNEE - 1-2 VIEW COMPARISON:  Radiograph 04/23/2021 FINDINGS: Left knee  arthroplasty in expected alignment. No periprosthetic lucency or fracture. There has been patellar resurfacing. Recent postsurgical change includes air and edema in the soft tissues and joint space. Anterior skin staples in place. IMPRESSION: Left knee arthroplasty without immediate postoperative complication. Electronically Signed   By: Narda Rutherford M.D.   On:  08/13/2021 17:35   DG Foot Complete Left  Result Date: 07/23/2021 Please see detailed radiograph report in office note.  DG Foot Complete Right  Result Date: 07/23/2021 Please see detailed radiograph report in office note.   Disposition: Discharge disposition: 01-Home or Self Care          Follow-up Information     [facility] Follow up.   Specialty: [facility] Contact information: [address] [place] [address] [place] [address] [phone]         Kathryne Hitch, MD Follow up in 2 week(s).   Specialty: Orthopedic Surgery Contact information: [address] [place] [address] [phone]                  Signed: Kathryne Hitch 08/15/2021, 8:30 AM

## 2021-08-16 DIAGNOSIS — D649 Anemia, unspecified: Secondary | ICD-10-CM | POA: Diagnosis not present

## 2021-08-16 DIAGNOSIS — I10 Essential (primary) hypertension: Secondary | ICD-10-CM | POA: Diagnosis not present

## 2021-08-16 DIAGNOSIS — K219 Gastro-esophageal reflux disease without esophagitis: Secondary | ICD-10-CM | POA: Diagnosis not present

## 2021-08-16 DIAGNOSIS — G43109 Migraine with aura, not intractable, without status migrainosus: Secondary | ICD-10-CM | POA: Diagnosis not present

## 2021-08-16 DIAGNOSIS — E538 Deficiency of other specified B group vitamins: Secondary | ICD-10-CM | POA: Diagnosis not present

## 2021-08-16 DIAGNOSIS — Z7982 Long term (current) use of aspirin: Secondary | ICD-10-CM | POA: Diagnosis not present

## 2021-08-16 DIAGNOSIS — T84063D Wear of articular bearing surface of internal prosthetic left knee joint, subsequent encounter: Secondary | ICD-10-CM | POA: Diagnosis not present

## 2021-08-16 DIAGNOSIS — Z87891 Personal history of nicotine dependence: Secondary | ICD-10-CM | POA: Diagnosis not present

## 2021-08-16 DIAGNOSIS — M19049 Primary osteoarthritis, unspecified hand: Secondary | ICD-10-CM | POA: Diagnosis not present

## 2021-08-16 DIAGNOSIS — Z7984 Long term (current) use of oral hypoglycemic drugs: Secondary | ICD-10-CM | POA: Diagnosis not present

## 2021-08-16 DIAGNOSIS — Z9049 Acquired absence of other specified parts of digestive tract: Secondary | ICD-10-CM | POA: Diagnosis not present

## 2021-08-16 DIAGNOSIS — J449 Chronic obstructive pulmonary disease, unspecified: Secondary | ICD-10-CM | POA: Diagnosis not present

## 2021-08-16 DIAGNOSIS — E785 Hyperlipidemia, unspecified: Secondary | ICD-10-CM | POA: Diagnosis not present

## 2021-08-16 DIAGNOSIS — E1140 Type 2 diabetes mellitus with diabetic neuropathy, unspecified: Secondary | ICD-10-CM | POA: Diagnosis not present

## 2021-08-19 ENCOUNTER — Encounter (HOSPITAL_COMMUNITY): Payer: Self-pay | Admitting: Orthopaedic Surgery

## 2021-08-19 DIAGNOSIS — E1140 Type 2 diabetes mellitus with diabetic neuropathy, unspecified: Secondary | ICD-10-CM | POA: Diagnosis not present

## 2021-08-19 DIAGNOSIS — E119 Type 2 diabetes mellitus without complications: Secondary | ICD-10-CM | POA: Diagnosis not present

## 2021-08-19 DIAGNOSIS — E785 Hyperlipidemia, unspecified: Secondary | ICD-10-CM | POA: Diagnosis not present

## 2021-08-19 DIAGNOSIS — J449 Chronic obstructive pulmonary disease, unspecified: Secondary | ICD-10-CM | POA: Diagnosis not present

## 2021-08-19 DIAGNOSIS — K219 Gastro-esophageal reflux disease without esophagitis: Secondary | ICD-10-CM | POA: Diagnosis not present

## 2021-08-19 DIAGNOSIS — E7800 Pure hypercholesterolemia, unspecified: Secondary | ICD-10-CM | POA: Diagnosis not present

## 2021-08-19 DIAGNOSIS — G8929 Other chronic pain: Secondary | ICD-10-CM | POA: Diagnosis not present

## 2021-08-19 DIAGNOSIS — I10 Essential (primary) hypertension: Secondary | ICD-10-CM | POA: Diagnosis not present

## 2021-08-19 DIAGNOSIS — D649 Anemia, unspecified: Secondary | ICD-10-CM | POA: Diagnosis not present

## 2021-08-19 DIAGNOSIS — E1165 Type 2 diabetes mellitus with hyperglycemia: Secondary | ICD-10-CM | POA: Diagnosis not present

## 2021-08-19 DIAGNOSIS — E1169 Type 2 diabetes mellitus with other specified complication: Secondary | ICD-10-CM | POA: Diagnosis not present

## 2021-08-19 DIAGNOSIS — T84063D Wear of articular bearing surface of internal prosthetic left knee joint, subsequent encounter: Secondary | ICD-10-CM | POA: Diagnosis not present

## 2021-08-21 DIAGNOSIS — K219 Gastro-esophageal reflux disease without esophagitis: Secondary | ICD-10-CM | POA: Diagnosis not present

## 2021-08-21 DIAGNOSIS — E1140 Type 2 diabetes mellitus with diabetic neuropathy, unspecified: Secondary | ICD-10-CM | POA: Diagnosis not present

## 2021-08-21 DIAGNOSIS — J449 Chronic obstructive pulmonary disease, unspecified: Secondary | ICD-10-CM | POA: Diagnosis not present

## 2021-08-21 DIAGNOSIS — T84063D Wear of articular bearing surface of internal prosthetic left knee joint, subsequent encounter: Secondary | ICD-10-CM | POA: Diagnosis not present

## 2021-08-21 DIAGNOSIS — D649 Anemia, unspecified: Secondary | ICD-10-CM | POA: Diagnosis not present

## 2021-08-21 DIAGNOSIS — I10 Essential (primary) hypertension: Secondary | ICD-10-CM | POA: Diagnosis not present

## 2021-08-23 DIAGNOSIS — D649 Anemia, unspecified: Secondary | ICD-10-CM | POA: Diagnosis not present

## 2021-08-23 DIAGNOSIS — I10 Essential (primary) hypertension: Secondary | ICD-10-CM | POA: Diagnosis not present

## 2021-08-23 DIAGNOSIS — E1140 Type 2 diabetes mellitus with diabetic neuropathy, unspecified: Secondary | ICD-10-CM | POA: Diagnosis not present

## 2021-08-23 DIAGNOSIS — J449 Chronic obstructive pulmonary disease, unspecified: Secondary | ICD-10-CM | POA: Diagnosis not present

## 2021-08-23 DIAGNOSIS — K219 Gastro-esophageal reflux disease without esophagitis: Secondary | ICD-10-CM | POA: Diagnosis not present

## 2021-08-23 DIAGNOSIS — T84063D Wear of articular bearing surface of internal prosthetic left knee joint, subsequent encounter: Secondary | ICD-10-CM | POA: Diagnosis not present

## 2021-08-27 ENCOUNTER — Other Ambulatory Visit: Payer: Self-pay

## 2021-08-27 ENCOUNTER — Ambulatory Visit (INDEPENDENT_AMBULATORY_CARE_PROVIDER_SITE_OTHER): Payer: Medicare Other | Admitting: Orthopaedic Surgery

## 2021-08-27 ENCOUNTER — Ambulatory Visit (HOSPITAL_COMMUNITY)
Admission: RE | Admit: 2021-08-27 | Discharge: 2021-08-27 | Disposition: A | Discharge status: Home / Self Care | Payer: Medicare Other | Source: Ambulatory Visit | Attending: Orthopaedic Surgery | Admitting: Orthopaedic Surgery

## 2021-08-27 ENCOUNTER — Encounter: Payer: Self-pay | Admitting: Orthopaedic Surgery

## 2021-08-27 ENCOUNTER — Telehealth: Payer: Self-pay

## 2021-08-27 DIAGNOSIS — M7989 Other specified soft tissue disorders: Secondary | ICD-10-CM | POA: Insufficient documentation

## 2021-08-27 DIAGNOSIS — Z96652 Presence of left artificial knee joint: Secondary | ICD-10-CM

## 2021-08-27 NOTE — Telephone Encounter (Signed)
Patricia Travis with [facility] wanted to let Dr. Magnus Ivan know that patient is Negative for DVT, left leg.

## 2021-08-27 NOTE — Progress Notes (Signed)
HPI: Patricia Travis returns today status post left total knee arthroplasty.  She is overall doing well.  She is only taking tramadol once daily.  She has been taking aspirin just once daily.  Denies any fevers or chills.  Does note that she has had 2 asthma attacks earlier this week and this is odd for her to have at night.  She did use her inhaler and this seemed to resolve the asthma attacks.  She notes increased swelling in her left leg over the last few days.  Physical exam: Left knee surgical incisions healing well no signs of infection or dehiscence.  Left calf slight swelling.  No tenderness.  She has full extension and flexion to 80 degrees.  Ambulates with a cane.  Impression: Status post left total knee arthroplasty 08/13/2021  Plan: We will send her for an ultrasound to rule out DVT lower leg.  Continue aspirin 81 mg once daily for another week.  We will call her if we need to put her on anticoagulant due to DVT.  In regards to the incision she will work on scar tissue mobilization.  She is given a prescription for steroids to the [facility] per her request for outpatient physical therapy to work on range of motion strengthening the knee.  We will see her back in 1 month sooner if there is any questions concerns.

## 2021-08-28 DIAGNOSIS — D649 Anemia, unspecified: Secondary | ICD-10-CM | POA: Diagnosis not present

## 2021-08-28 DIAGNOSIS — I10 Essential (primary) hypertension: Secondary | ICD-10-CM | POA: Diagnosis not present

## 2021-08-28 DIAGNOSIS — K219 Gastro-esophageal reflux disease without esophagitis: Secondary | ICD-10-CM | POA: Diagnosis not present

## 2021-08-28 DIAGNOSIS — T84063D Wear of articular bearing surface of internal prosthetic left knee joint, subsequent encounter: Secondary | ICD-10-CM | POA: Diagnosis not present

## 2021-08-28 DIAGNOSIS — J449 Chronic obstructive pulmonary disease, unspecified: Secondary | ICD-10-CM | POA: Diagnosis not present

## 2021-08-28 DIAGNOSIS — E1140 Type 2 diabetes mellitus with diabetic neuropathy, unspecified: Secondary | ICD-10-CM | POA: Diagnosis not present

## 2021-09-04 DIAGNOSIS — R2689 Other abnormalities of gait and mobility: Secondary | ICD-10-CM | POA: Diagnosis not present

## 2021-09-04 DIAGNOSIS — M25562 Pain in left knee: Secondary | ICD-10-CM | POA: Diagnosis not present

## 2021-09-06 DIAGNOSIS — M25562 Pain in left knee: Secondary | ICD-10-CM | POA: Diagnosis not present

## 2021-09-06 DIAGNOSIS — R2689 Other abnormalities of gait and mobility: Secondary | ICD-10-CM | POA: Diagnosis not present

## 2021-09-10 DIAGNOSIS — M25562 Pain in left knee: Secondary | ICD-10-CM | POA: Diagnosis not present

## 2021-09-10 DIAGNOSIS — R2689 Other abnormalities of gait and mobility: Secondary | ICD-10-CM | POA: Diagnosis not present

## 2021-09-13 DIAGNOSIS — M25562 Pain in left knee: Secondary | ICD-10-CM | POA: Diagnosis not present

## 2021-09-13 DIAGNOSIS — R2689 Other abnormalities of gait and mobility: Secondary | ICD-10-CM | POA: Diagnosis not present

## 2021-09-17 DIAGNOSIS — R2689 Other abnormalities of gait and mobility: Secondary | ICD-10-CM | POA: Diagnosis not present

## 2021-09-17 DIAGNOSIS — M25562 Pain in left knee: Secondary | ICD-10-CM | POA: Diagnosis not present

## 2021-09-19 DIAGNOSIS — Z23 Encounter for immunization: Secondary | ICD-10-CM | POA: Diagnosis not present

## 2021-09-20 DIAGNOSIS — M25562 Pain in left knee: Secondary | ICD-10-CM | POA: Diagnosis not present

## 2021-09-20 DIAGNOSIS — R2689 Other abnormalities of gait and mobility: Secondary | ICD-10-CM | POA: Diagnosis not present

## 2021-09-22 DIAGNOSIS — G8929 Other chronic pain: Secondary | ICD-10-CM | POA: Diagnosis not present

## 2021-09-22 DIAGNOSIS — E1169 Type 2 diabetes mellitus with other specified complication: Secondary | ICD-10-CM | POA: Diagnosis not present

## 2021-09-22 DIAGNOSIS — E7800 Pure hypercholesterolemia, unspecified: Secondary | ICD-10-CM | POA: Diagnosis not present

## 2021-09-22 DIAGNOSIS — J449 Chronic obstructive pulmonary disease, unspecified: Secondary | ICD-10-CM | POA: Diagnosis not present

## 2021-09-22 DIAGNOSIS — E1165 Type 2 diabetes mellitus with hyperglycemia: Secondary | ICD-10-CM | POA: Diagnosis not present

## 2021-09-22 DIAGNOSIS — E119 Type 2 diabetes mellitus without complications: Secondary | ICD-10-CM | POA: Diagnosis not present

## 2021-09-22 DIAGNOSIS — I10 Essential (primary) hypertension: Secondary | ICD-10-CM | POA: Diagnosis not present

## 2021-09-22 DIAGNOSIS — K219 Gastro-esophageal reflux disease without esophagitis: Secondary | ICD-10-CM | POA: Diagnosis not present

## 2021-09-22 DIAGNOSIS — E785 Hyperlipidemia, unspecified: Secondary | ICD-10-CM | POA: Diagnosis not present

## 2021-09-24 ENCOUNTER — Encounter: Payer: Self-pay | Admitting: Orthopaedic Surgery

## 2021-09-24 ENCOUNTER — Ambulatory Visit (INDEPENDENT_AMBULATORY_CARE_PROVIDER_SITE_OTHER): Payer: Medicare Other | Admitting: Orthopaedic Surgery

## 2021-09-24 DIAGNOSIS — R2689 Other abnormalities of gait and mobility: Secondary | ICD-10-CM | POA: Diagnosis not present

## 2021-09-24 DIAGNOSIS — M25562 Pain in left knee: Secondary | ICD-10-CM | POA: Diagnosis not present

## 2021-09-24 DIAGNOSIS — Z96652 Presence of left artificial knee joint: Secondary | ICD-10-CM

## 2021-09-24 DIAGNOSIS — T84063D Wear of articular bearing surface of internal prosthetic left knee joint, subsequent encounter: Secondary | ICD-10-CM

## 2021-09-24 NOTE — Progress Notes (Signed)
The patient is now about 6 weeks status post a left knee polyliner exchange and liner exchange underneath the patella.  The metal components were intact.  Her original knee replacement was about 14 to 15 years ago.  She reports that she has good range of motion and strength and physical therapist been pushing her hard.  She is just taking tramadol occasionally for pain.  Her extension is full with her left knee today and her flexion is past 100 degrees.  The knee feels ligamentously stable.  The incision is well-healed.  There is warmth with the need to be expected and she understands that as well.  She will continue to push herself through physical therapy.  We will see her back in 4 weeks to see how she is doing overall.  No x-rays are needed.  If she looks good at that visit we probably will need to see her back for 3 to 6 months.

## 2021-09-27 DIAGNOSIS — R2689 Other abnormalities of gait and mobility: Secondary | ICD-10-CM | POA: Diagnosis not present

## 2021-09-27 DIAGNOSIS — M25562 Pain in left knee: Secondary | ICD-10-CM | POA: Diagnosis not present

## 2021-10-01 ENCOUNTER — Telehealth: Payer: Self-pay

## 2021-10-01 DIAGNOSIS — M25562 Pain in left knee: Secondary | ICD-10-CM | POA: Diagnosis not present

## 2021-10-01 DIAGNOSIS — R2689 Other abnormalities of gait and mobility: Secondary | ICD-10-CM | POA: Diagnosis not present

## 2021-10-01 NOTE — Telephone Encounter (Signed)
Pt called and would like to know if she can get approved for the gel injection and have it done on the 28th of this month.  I explained to the patient that it is a process and she stated she understood but still wanted me to ask.

## 2021-10-01 NOTE — Telephone Encounter (Signed)
Talked with patient and advised her that it has to be 6 months from the last date that she received previous gel injection, which was 07/24/2021 for right knee.  Next available gel injection for right knee would be January 22, 2022 due to February only having 28 days.  Patient voiced that she understands.

## 2021-10-09 DIAGNOSIS — R2689 Other abnormalities of gait and mobility: Secondary | ICD-10-CM | POA: Diagnosis not present

## 2021-10-09 DIAGNOSIS — M25562 Pain in left knee: Secondary | ICD-10-CM | POA: Diagnosis not present

## 2021-10-15 DIAGNOSIS — R2689 Other abnormalities of gait and mobility: Secondary | ICD-10-CM | POA: Diagnosis not present

## 2021-10-15 DIAGNOSIS — M25562 Pain in left knee: Secondary | ICD-10-CM | POA: Diagnosis not present

## 2021-10-21 ENCOUNTER — Ambulatory Visit (INDEPENDENT_AMBULATORY_CARE_PROVIDER_SITE_OTHER): Payer: Medicare Other | Admitting: Orthopaedic Surgery

## 2021-10-21 ENCOUNTER — Encounter: Payer: Self-pay | Admitting: Orthopaedic Surgery

## 2021-10-21 ENCOUNTER — Other Ambulatory Visit: Payer: Self-pay

## 2021-10-21 DIAGNOSIS — Z96652 Presence of left artificial knee joint: Secondary | ICD-10-CM

## 2021-10-21 NOTE — Progress Notes (Signed)
The patient is now 2 months status post a left knee polyliner exchange as well as exchange of the poly underneath the patella.  She says she is doing great work on good range of motion and strength of her knee.  She is very active 70 year old female and states that she is doing well.  Examination of her left knee does still show some warmth and swelling but there is no redness.  She has normal extension and flexion is the past 90 degrees.  The knee is ligamentously stable.  This point she will continue increase her activities as comfort allows.  I do not need to see her back for 6 months unless there are any issues.  We will have a standing AP and lateral of her left knee at the next visit.

## 2021-10-22 DIAGNOSIS — M25562 Pain in left knee: Secondary | ICD-10-CM | POA: Diagnosis not present

## 2021-10-22 DIAGNOSIS — R2689 Other abnormalities of gait and mobility: Secondary | ICD-10-CM | POA: Diagnosis not present

## 2021-10-29 DIAGNOSIS — M25562 Pain in left knee: Secondary | ICD-10-CM | POA: Diagnosis not present

## 2021-10-29 DIAGNOSIS — R2689 Other abnormalities of gait and mobility: Secondary | ICD-10-CM | POA: Diagnosis not present

## 2021-11-12 ENCOUNTER — Telehealth: Payer: Self-pay | Admitting: Orthopaedic Surgery

## 2021-11-12 DIAGNOSIS — Z03818 Encounter for observation for suspected exposure to other biological agents ruled out: Secondary | ICD-10-CM | POA: Diagnosis not present

## 2021-11-12 DIAGNOSIS — Z20822 Contact with and (suspected) exposure to covid-19: Secondary | ICD-10-CM | POA: Diagnosis not present

## 2021-11-12 NOTE — Telephone Encounter (Signed)
LVM for pt call and r/s with blackman

## 2022-01-01 ENCOUNTER — Other Ambulatory Visit: Payer: Self-pay | Admitting: Family Medicine

## 2022-01-01 DIAGNOSIS — Z1231 Encounter for screening mammogram for malignant neoplasm of breast: Secondary | ICD-10-CM

## 2022-01-13 ENCOUNTER — Telehealth: Payer: Self-pay | Admitting: Orthopaedic Surgery

## 2022-01-13 NOTE — Telephone Encounter (Signed)
Pt called. Wanting to set up gel injection appt. Can we go ahead and get it approved?  CB [phone]

## 2022-01-14 NOTE — Telephone Encounter (Signed)
Noted.  Will call patient to clarify which knee for gel injection.

## 2022-01-15 ENCOUNTER — Other Ambulatory Visit: Payer: Self-pay

## 2022-01-15 ENCOUNTER — Ambulatory Visit
Admission: RE | Admit: 2022-01-15 | Discharge: 2022-01-15 | Disposition: A | Discharge status: Home / Self Care | Payer: Medicare Other | Source: Ambulatory Visit | Attending: Family Medicine | Admitting: Family Medicine

## 2022-01-15 DIAGNOSIS — Z1231 Encounter for screening mammogram for malignant neoplasm of breast: Secondary | ICD-10-CM | POA: Insufficient documentation

## 2022-01-15 NOTE — Telephone Encounter (Signed)
Talked with patient to clarify which knee for gel injection.

## 2022-01-23 ENCOUNTER — Telehealth: Payer: Self-pay | Admitting: Orthopaedic Surgery

## 2022-01-23 NOTE — Telephone Encounter (Signed)
Talked with patient concerning gel injection.  Appointment scheduled. 

## 2022-01-23 NOTE — Telephone Encounter (Signed)
Pt called checking on status of right knee gel injection for approval from insurance company. Please call pt with any updates at [phone]. ?

## 2022-01-27 ENCOUNTER — Telehealth: Payer: Self-pay

## 2022-01-27 NOTE — Telephone Encounter (Signed)
Approved for Monovisc, right knee. ?Buy & Bill ?Secondary insurance will pick up remaining eligible expenses at 100%. ?No Co-pay ?No PA required ? ?Appt. 02/12/2022 with Dr. Magnus Ivan ?

## 2022-01-29 ENCOUNTER — Ambulatory Visit (INDEPENDENT_AMBULATORY_CARE_PROVIDER_SITE_OTHER): Payer: Medicare Other | Admitting: Endocrinology

## 2022-01-29 ENCOUNTER — Other Ambulatory Visit: Payer: Self-pay

## 2022-01-29 VITALS — BP 150/72 | HR 87 | Ht 65.0 in | Wt 164.8 lb

## 2022-01-29 DIAGNOSIS — E119 Type 2 diabetes mellitus without complications: Secondary | ICD-10-CM | POA: Diagnosis not present

## 2022-01-29 LAB — POCT GLYCOSYLATED HEMOGLOBIN (HGB A1C): Hemoglobin A1C: 9.1 % — AB (ref 4.0–5.6)

## 2022-01-29 MED ORDER — RYBELSUS 7 MG PO TABS: 7.0000 mg | ORAL_TABLET | Freq: Every day | ORAL | 3 refills | Status: DC

## 2022-01-29 MED ORDER — METFORMIN HCL ER 500 MG PO TB24: 2000.0000 mg | ORAL_TABLET | Freq: Every day | ORAL | 3 refills | Status: DC

## 2022-01-29 NOTE — Progress Notes (Signed)
Subjective:    Patient ID: Patricia Travis, female    DOB: 02/19/1951, 71 y.o.   MRN: [id number]  HPI pt is referred by Dr Radene Ou, for diabetes.  Pt states DM was dx'ed in [id number]; it is complicated by DN and PPN; she has never been on insulin; pt says her diet and exercise are fair; she has never had GDM, pancreatitis, pancreatic surgery, severe hypoglycemia or DKA.  She takes 3 oral meds.  Pt has not recently checked cbg, as meter no longer works.  No change in chronic nausea.   Past Medical History:  Diagnosis Date   Anemia    hx- high school   Asthma    Complication of anesthesia    problem being combative awakening from  hysterectomy   79 but had total knee 08 and did fine   COPD (chronic obstructive pulmonary disease) (HCC)    Diabetes mellitus    Dyspnea    GERD (gastroesophageal reflux disease)    occ   Headache(784.0)    migraines hx - couple/month   Hyperlipidemia    Hypertension    Osteoarthritis    fingers, hands   Osteopenia    PONV (postoperative nausea and vomiting)     Past Surgical History:  Procedure Laterality Date   ABDOMINAL HYSTERECTOMY     total   CHOLECYSTECTOMY  08/16/2012   Procedure: LAPAROSCOPIC CHOLECYSTECTOMY WITH INTRAOPERATIVE CHOLANGIOGRAM;  Surgeon: Imogene Burn. Georgette Dover, MD;  Location: [place];  Service: General;  Laterality: N/A;   KNEE ARTHROSCOPY  Bent   L   OOPHORECTOMY     TOTAL KNEE ARTHROPLASTY Left 06/01/2007   TOTAL KNEE REVISION Left 08/13/2021   Procedure: LEFT TOTAL KNEE REVISION;  Surgeon: Mcarthur Rossetti, MD;  Location: [place];  Service: Orthopedics;  Laterality: Left;    Social History   Socioeconomic History   Marital status: Married    Spouse name: Not on file   Number of children: Not on file   Years of education: Not on file   Highest education level: Not on file  Occupational History   Occupation: Finance    Employer: [employer]    Comment: Accounts Payable  Tobacco Use    Smoking status: Former    Packs/day: 1.50    Years: 25.00    Pack years: 37.50    Types: Cigarettes    Quit date: 09/07/1992    Years since quitting: 29.4   Smokeless tobacco: Never  Vaping Use   Vaping Use: Never used  Substance and Sexual Activity   Alcohol use: No   Drug use: No   Sexual activity: Not on file  Other Topics Concern   Not on file  Social History Narrative   Not on file   Social Determinants of Health   Financial Resource Strain: Not on file  Food Insecurity: Not on file  Transportation Needs: Not on file  Physical Activity: Not on file  Stress: Not on file  Social Connections: Not on file  Intimate Partner Violence: Not on file    Current Outpatient Medications on File Prior to Visit  Medication Sig Dispense Refill   albuterol (VENTOLIN HFA) 108 (90 Base) MCG/ACT inhaler Inhale 1-2 puffs into the lungs every 6 (six) hours as needed for wheezing or shortness of breath.     amLODipine (NORVASC) 5 MG tablet Take 1 tablet (5 mg total) by mouth daily. 90 tablet 0   aspirin 81  MG chewable tablet Chew 1 tablet (81 mg total) by mouth 2 (two) times daily. 60 tablet 0   gabapentin (NEURONTIN) 100 MG capsule Take 100 mg by mouth at bedtime.     gabapentin (NEURONTIN) 600 MG tablet Take 600 mg by mouth at bedtime.     glimepiride (AMARYL) 4 MG tablet Take 2 mg by mouth 2 (two) times daily with a meal.  0   losartan (COZAAR) 25 MG tablet Take 1 tablet (25 mg total) by mouth daily. 90 tablet 0   meloxicam (MOBIC) 15 MG tablet Take 1 tablet (15 mg total) by mouth daily. 90 tablet 3   methocarbamol (ROBAXIN) 500 MG tablet Take 1 tablet (500 mg total) by mouth every 6 (six) hours as needed for muscle spasms. 40 tablet 1   ondansetron (ZOFRAN ODT) 4 MG disintegrating tablet Take 1 tablet (4 mg total) by mouth every 8 (eight) hours as needed for nausea or vomiting. 20 tablet 0   simvastatin (ZOCOR) 40 MG tablet Take 40 mg by mouth every evening.     traMADol (ULTRAM) 50  MG tablet Take 50 mg by mouth every 8 (eight) hours as needed for moderate pain. For pain     traMADol (ULTRAM) 50 MG tablet Take 1-2 tablets (50-100 mg total) by mouth every 6 (six) hours as needed for moderate pain. 40 tablet 0   umeclidinium bromide (INCRUSE ELLIPTA) 62.5 MCG/INH AEPB Inhale 1 puff into the lungs daily as needed (shortness of breath).     No current facility-administered medications on file prior to visit.    Allergies  Allergen Reactions   Penicillins Anaphylaxis   Ace Inhibitors Cough   Cortisone Acetate     REACTION: Makes pain worse   Tetracyclines & Related Nausea And Vomiting    Family History  Problem Relation Age of Onset   Heart attack Father    Coronary artery disease Father    Heart disease Father    Arthritis Mother        RA, and osteoarthritis   Cancer Maternal Grandmother        breast   Breast cancer Maternal Grandmother 39   Breast cancer Maternal Aunt 70   Diabetes Sister    Diabetes Other        grandparents   Cancer Paternal Grandfather     BP (!) 150/72    Pulse 87    Ht 5\' 5"  (1.651 m)    Wt 164 lb 12.8 oz (74.8 kg)    SpO2 96%    BMI 27.42 kg/m    Review of Systems denies sob, n/v, and memory loss.  She has lost a few lbs.      Objective:   Physical Exam VITAL SIGNS:  See vs page GENERAL: no distress Pulses: dorsalis pedis intact bilat.   MSK: no deformity of the feet CV: trace bilat leg edema, and bilat vv's Skin:  no ulcer on the feet.  normal color and temp on the feet. Neuro: sensation is intact to touch on the feet  Lab Results  Component Value Date   CREATININE 0.65 08/14/2021   BUN 11 08/14/2021   NA 136 08/14/2021   K 4.5 08/14/2021   CL 101 08/14/2021   CO2 24 08/14/2021     Lab Results  Component Value Date   HGBA1C 9.1 (A) 01/29/2022   I have reviewed outside records, and summarized: Pt was noted to have elevated A1c, and referred here.  HTN, COPD, dyslipidemia, and migraine  were also  addressed.     Assessment & Plan:  Type 2 DM: uncontrolled.   Patient Instructions  good diet and exercise significantly improve the control of your diabetes.  please let me know if you wish to be referred to a dietician.  high blood sugar is very risky to your health.  you should see an eye doctor and dentist every year.  It is very important to get all recommended vaccinations.  Controlling your blood pressure and cholesterol drastically reduces the damage diabetes does to your body.  Those who smoke should quit.  Please discuss these with your doctor.  check your blood sugar once a day.  vary the time of day when you check, between before the 3 meals, and at bedtime.  also check if you have symptoms of your blood sugar being too high or too low.  please keep a record of the readings and bring it to your next appointment here (or you can bring the meter itself).  You can write it on any piece of paper.  please call us sooner if your blood sugar goes below 70, or if most of your readings are over 200.   We will need to take this complex situation in stages.   I have sent a prescription to your pharmacy: to change metformin to extended-release, and to change Januvia to Rybelsus. Please continue the same metformin.   Please come back for a follow-up appointment in 1 month.   Here are some Diabetic supply providers:  [email or web address] [phone] Fax [phone]  [email or web address] [phone] Fax [phone]  [email or web address] [phone] ext [phone] Fax [phone]  [email or web address] [phone] Fax [phone]  [email or web address] [phone] Fax [phone]  [email or web address]com [phone] Fax [phone]  [email or web address] [phone] Fax [phone]  [email or web address] [phone] Fax [email or web address] [phone] option #1 Fax [phone]  [email or web address] [phone] Fax [phone]  [place] [facility] [phone] Fax [phone]  [facility] [email or web address] [phone] Fax [phone]

## 2022-01-29 NOTE — Patient Instructions (Addendum)
good diet and exercise significantly improve the control of your diabetes.  please let me know if you wish to be referred to a dietician.  high blood sugar is very risky to your health.  you should see an eye doctor and dentist every year.  It is very important to get all recommended vaccinations.  ?Controlling your blood pressure and cholesterol drastically reduces the damage diabetes does to your body.  Those who smoke should quit.  Please discuss these with your doctor.  ?check your blood sugar once a day.  vary the time of day when you check, between before the 3 meals, and at bedtime.  also check if you have symptoms of your blood sugar being too high or too low.  please keep a record of the readings and bring it to your next appointment here (or you can bring the meter itself).  You can write it on any piece of paper.  please call us sooner if your blood sugar goes below 70, or if most of your readings are over 200.   ?We will need to take this complex situation in stages.   ?I have sent a prescription to your pharmacy: to change metformin to extended-release, and to change Januvia to Rybelsus. ?Please continue the same metformin.   ?Please come back for a follow-up appointment in 1 month.  ? ?Here are some Diabetic supply providers: ? ?[facility] ?[email or web address] ?[phone] ?Fax [phone] ? ?[facility] ?[email or web address] ?[phone] ?Fax [phone] ? ?[facility] ?[email or web address] ?[phone] ext [phone] ?Fax [phone] ? ?[facility] ?[email or web address] ?[phone] ?Fax [phone] ? ?[facility] ?[email or web address] ?[phone] ?Fax [phone] ? ?[place] ?[email or web address] ?[phone] ?Fax [phone] ? ?[facility] ?[email or web address] ?[phone] ?Fax [phone] ? ?[facility] ?[email or web address] ?[phone] ?Fax [phone] ? ?[facility] ?[email or web address] ?[phone]  option #1 ?Fax [phone] ? ?[place] ?[email or web address] ?[phone] ?Fax [phone] ? ?[place] [facility] ?[phone] ?Fax [phone] ? ?[facility] [facility] ?[email or web address] ?[phone] ?Fax [phone] ? ?

## 2022-02-01 DIAGNOSIS — E1169 Type 2 diabetes mellitus with other specified complication: Secondary | ICD-10-CM | POA: Insufficient documentation

## 2022-02-01 DIAGNOSIS — E119 Type 2 diabetes mellitus without complications: Secondary | ICD-10-CM | POA: Insufficient documentation

## 2022-02-12 ENCOUNTER — Telehealth: Payer: Self-pay | Admitting: Orthopaedic Surgery

## 2022-02-12 ENCOUNTER — Ambulatory Visit (INDEPENDENT_AMBULATORY_CARE_PROVIDER_SITE_OTHER): Payer: Medicare Other

## 2022-02-12 ENCOUNTER — Other Ambulatory Visit: Payer: Self-pay | Admitting: Physician Assistant

## 2022-02-12 ENCOUNTER — Ambulatory Visit (INDEPENDENT_AMBULATORY_CARE_PROVIDER_SITE_OTHER): Payer: Medicare Other | Admitting: Orthopaedic Surgery

## 2022-02-12 ENCOUNTER — Encounter: Payer: Self-pay | Admitting: Orthopaedic Surgery

## 2022-02-12 DIAGNOSIS — Z96652 Presence of left artificial knee joint: Secondary | ICD-10-CM | POA: Diagnosis not present

## 2022-02-12 DIAGNOSIS — Z96653 Presence of artificial knee joint, bilateral: Secondary | ICD-10-CM | POA: Diagnosis not present

## 2022-02-12 DIAGNOSIS — M1711 Unilateral primary osteoarthritis, right knee: Secondary | ICD-10-CM

## 2022-02-12 DIAGNOSIS — T84063D Wear of articular bearing surface of internal prosthetic left knee joint, subsequent encounter: Secondary | ICD-10-CM

## 2022-02-12 MED ORDER — HYALURONAN 88 MG/4ML IX SOSY
88.0000 mg | PREFILLED_SYRINGE | INTRA_ARTICULAR | Status: AC | PRN
  Administered 2022-02-12: 88 mg via INTRA_ARTICULAR

## 2022-02-12 MED ORDER — ONDANSETRON 4 MG PO TBDP: 4.0000 mg | ORAL_TABLET | Freq: Three times a day (TID) | ORAL | 0 refills | Status: DC | PRN

## 2022-02-12 NOTE — Progress Notes (Signed)
? ?Office Visit Note ?  ?Patient: Patricia Travis           ?Date of Birth: Mar 16, 1951           ?MRN: [id number] ?Visit Date: 02/12/2022 ?             ?Requested by: Patricia Travis ?[address] ?[place],  [place] ?PCP: Patricia Travis ? ? ?Assessment & Plan: ?Visit Diagnoses:  ?1. 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 Unilateral primary osteoarthritis, right knee   ? ? ?Plan: She shown IT band stretching exercises which she will perform on the left.  Also asked her to go back to working on quad strengthening of the left knee.  She tolerated the right knee Monovisc injection well today.  She understands she needs to wait least 6 months between Monovisc injections.  Questions were encouraged and answered.  We will see her back in 8 weeks see how she is doing overall.  Questions were encouraged and answered discussed the use of Voltaren gel over the lateral aspect of the left knee 4 g up to 4 times daily see if this is of some benefit. ? ?Follow-Up Instructions: Return in about 8 weeks (around 04/09/2022).  ? ?Orders:  ?Orders Placed This Encounter  ?Procedures  ? Large Joint Inj  ? XR Knee 1-2 Views Left  ? ?No orders of the defined types were placed in this encounter. ? ? ? ? Procedures: ?Large Joint Inj: R knee on 02/12/2022 10:56 AM ?Indications: pain ?Details: 22 G 1.5 in needle, superolateral approach ? ?Arthrogram: No ? ?Medications: 88 mg Hyaluronan 88 MG/4ML ?Outcome: tolerated well, no immediate complications ?Procedure, treatment alternatives, risks and benefits explained, specific risks discussed. Consent was given by the patient. Immediately prior to procedure a time out was called to verify the correct patient, procedure, equipment, support staff and site/side marked as required. Patient was prepped and draped in the usual sterile fashion.  ? ? ? ? ?Clinical Data: ?No  additional findings. ? ? ?Subjective: ?Chief Complaint  ?Patient presents with  ? Right Knee - Follow-up, Pain  ? Left Knee - Routine Post Op  ? ? ?HPI ?Patricia Travis her turns today for follow-up of her left total knee.  She underwent left knee poly exchange and patella poly revision on 08/13/2021.  She states she started having some pain lateral aspect left knee about 2 months ago.  No new injuries or falls.  She is also here today for scheduled right knee Monovisc injection.  She states the last injection did not give her prolonged relief as the first supplemental injection did. ? ?Review of Systems ?See HPI otherwise negative or noncontributory. ? ?Objective: ?Vital Signs: There were no vitals taken for this visit. ? ?Physical Exam ?Constitutional:   ?   Appearance: She is not ill-appearing or diaphoretic.  ?Pulmonary:  ?   Effort: Pulmonary effort is normal.  ?Neurological:  ?   Mental Status: She is alert and oriented to person, place, and time.  ?Psychiatric:     ?   Mood and Affect: Mood normal.  ? ? ?Ortho Exam ?Bilateral knees good range of motion of both knees.  No abnormal warmth erythema or effusion of either knee.  Left knee surgical incisions healing well no signs of infection.  Left knee no instability valgus varus stressing.  Tenderness over the distal  IT band down to its insertion. ? ?Specialty Comments:  ?No specialty comments available. ? ?Imaging: ?XR Knee 1-2 Views Left ? ?Result Date: 02/12/2022 ?Left knee total knee arthroplasty components well-seated.  No acute fractures.  Knee is well located.  No bony abnormalities.  ? ? ?PMFS History: ?Patient Active Problem List  ? Diagnosis Date Noted  ? Diabetes (HCC) 02/01/2022  ? 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 Status post revision of total replacement of left knee 08/13/2021  ? Unilateral primary osteoarthritis, right knee 01/10/2020  ? Left knee pain 12/31/2015  ? Chronic cholecystitis 08/05/2012  ? Left arm pain  09/25/2011  ? Left arm numbness 09/25/2011  ? Tinnitus 09/12/2011  ? MICROALBUMINURIA 01/03/2011  ? VITAMIN B12 DEFICIENCY 03/13/2010  ? PERIPHERAL NEUROPATHY 03/12/2010  ? COPD, MILD 08/23/2009  ? CARPAL TUNNEL SYNDROME, LEFT 05/08/2009  ? ARTHRITIS, CARPOMETACARPAL JOINT 05/08/2009  ? HYPERLIPIDEMIA 02/07/2007  ? MIGRAINE, CLASSICAL W/O INTRACTABLE MIGRAINE 02/07/2007  ? HYPERTENSION 02/07/2007  ? GERD 02/07/2007  ? OSTEOARTHRITIS 02/07/2007  ? KNEE PAIN, LEFT, CHRONIC 02/07/2007  ? OSTEOPENIA 02/07/2007  ? ?Past Medical History:  ?Diagnosis Date  ? Anemia   ? hx- high school  ? Asthma   ? Complication of anesthesia   ? problem being combative awakening from  hysterectomy   79 but had total knee 08 and did fine  ? COPD (chronic obstructive pulmonary disease) (HCC)   ? Diabetes mellitus   ? Dyspnea   ? GERD (gastroesophageal reflux disease)   ? occ  ? Headache(784.0)   ? migraines hx - couple/month  ? Hyperlipidemia   ? Hypertension   ? Osteoarthritis   ? fingers, hands  ? Osteopenia   ? PONV (postoperative nausea and vomiting)   ?  ?Family History  ?Problem Relation Age of Onset  ? Heart attack Father   ? Coronary artery disease Father   ? Heart disease Father   ? Arthritis Mother   ?     RA, and osteoarthritis  ? Cancer Maternal Grandmother   ?     breast  ? Breast cancer Maternal Grandmother 50  ? Breast cancer Maternal Aunt 22  ? Diabetes Sister   ? Diabetes Other   ?     grandparents  ? Cancer Paternal Grandfather   ?  ?Past Surgical History:  ?Procedure Laterality Date  ? ABDOMINAL HYSTERECTOMY    ? total  ? CHOLECYSTECTOMY  08/16/2012  ? Procedure: LAPAROSCOPIC CHOLECYSTECTOMY WITH INTRAOPERATIVE CHOLANGIOGRAM;  Surgeon: Wilmon Arms. Corliss Skains, [place];  Location: [facility] OR;  Service: General;  Laterality: N/A;  ? KNEE ARTHROSCOPY  1990  ? L  ? KNEE CARTILAGE SURGERY  1992  ? L  ? OOPHORECTOMY    ? TOTAL KNEE ARTHROPLASTY Left 06/01/2007  ? TOTAL KNEE REVISION Left 08/13/2021  ? Procedure: LEFT TOTAL KNEE REVISION;  Surgeon:  Kathryne Hitch, [place];  Location: [facility] OR;  Service: Orthopedics;  Laterality: Left;  ? ?Social History  ? ?Occupational History  ? Occupation: Finance  ?  Employer: [employer]  ?  Comment: Accounts Payable  ?Tobacco Use  ? Smoking status: Former  ?  Packs/day: 1.50  ?  Years: 25.00  ?  Pack years: 37.50  ?  Types: Cigarettes  ?  Quit date: 09/07/1992  ?  Years since quitting: 29.4  ? Smokeless tobacco: Never  ?Vaping Use  ? Vaping Use: Never used  ?Substance and Sexual Activity  ? Alcohol use: No  ?  Drug use: No  ? Sexual activity: Not on file  ? ? ? ? ? ? ?

## 2022-02-12 NOTE — Telephone Encounter (Signed)
Pt called requesting a refill of ondansetron. Please send to [facility] pharmacy in [place]. Pt phone number is [phone]. ? ?

## 2022-02-12 NOTE — Telephone Encounter (Signed)
Please advise 

## 2022-03-11 DIAGNOSIS — E119 Type 2 diabetes mellitus without complications: Secondary | ICD-10-CM | POA: Diagnosis not present

## 2022-03-11 DIAGNOSIS — Z1211 Encounter for screening for malignant neoplasm of colon: Secondary | ICD-10-CM | POA: Diagnosis not present

## 2022-03-11 DIAGNOSIS — Z0000 Encounter for general adult medical examination without abnormal findings: Secondary | ICD-10-CM | POA: Diagnosis not present

## 2022-03-11 DIAGNOSIS — G43909 Migraine, unspecified, not intractable, without status migrainosus: Secondary | ICD-10-CM | POA: Diagnosis not present

## 2022-03-11 DIAGNOSIS — I10 Essential (primary) hypertension: Secondary | ICD-10-CM | POA: Diagnosis not present

## 2022-03-11 DIAGNOSIS — E7800 Pure hypercholesterolemia, unspecified: Secondary | ICD-10-CM | POA: Diagnosis not present

## 2022-03-11 DIAGNOSIS — J449 Chronic obstructive pulmonary disease, unspecified: Secondary | ICD-10-CM | POA: Diagnosis not present

## 2022-03-14 DIAGNOSIS — I10 Essential (primary) hypertension: Secondary | ICD-10-CM | POA: Diagnosis not present

## 2022-03-14 DIAGNOSIS — K219 Gastro-esophageal reflux disease without esophagitis: Secondary | ICD-10-CM | POA: Diagnosis not present

## 2022-03-14 DIAGNOSIS — E1169 Type 2 diabetes mellitus with other specified complication: Secondary | ICD-10-CM | POA: Diagnosis not present

## 2022-03-14 DIAGNOSIS — E7800 Pure hypercholesterolemia, unspecified: Secondary | ICD-10-CM | POA: Diagnosis not present

## 2022-03-14 DIAGNOSIS — J449 Chronic obstructive pulmonary disease, unspecified: Secondary | ICD-10-CM | POA: Diagnosis not present

## 2022-03-14 DIAGNOSIS — G8929 Other chronic pain: Secondary | ICD-10-CM | POA: Diagnosis not present

## 2022-03-19 ENCOUNTER — Encounter: Payer: Self-pay | Admitting: Endocrinology

## 2022-03-19 ENCOUNTER — Ambulatory Visit (INDEPENDENT_AMBULATORY_CARE_PROVIDER_SITE_OTHER): Payer: Medicare Other | Admitting: Endocrinology

## 2022-03-19 VITALS — BP 150/66 | HR 80 | Ht 65.0 in | Wt 160.8 lb

## 2022-03-19 DIAGNOSIS — E119 Type 2 diabetes mellitus without complications: Secondary | ICD-10-CM | POA: Diagnosis not present

## 2022-03-19 MED ORDER — ONETOUCH VERIO W/DEVICE KIT: 1.0000 | PACK | Freq: Once | 0 refills | Status: AC

## 2022-03-19 MED ORDER — ONETOUCH VERIO VI STRP: 1.0000 | ORAL_STRIP | Freq: Every day | 3 refills | Status: DC

## 2022-03-19 NOTE — Progress Notes (Signed)
? ?Subjective:  ? ? Patient ID: Patricia Travis, female    DOB: [birth year], 71 y.o.   MRN: [id number] ? ?HPI ?Pt returns for f/u of diabetes mellitus: ?DM type: 2 ?Dx'ed: 2009 ?Complications: DN and PPN ?Therapy: 3 oral meds ?GDM: never ?DKA: never ?Severe hypoglycemia: never ?Pancreatitis: never ?Pancreatic imaging: normal on 2013 CT ?SDOH: none ?Other: she has never been on insulin; ?Interval history: Nausea persists, but it is less now.  She has not obtained a meter.  She takes meds as rx'ed.   ?Past Medical History:  ?Diagnosis Date  ? Anemia   ? hx- high school  ? Asthma   ? Complication of anesthesia   ? problem being combative awakening from  hysterectomy   79 but had total knee 08 and did fine  ? COPD (chronic obstructive pulmonary disease) (HCC)   ? Diabetes mellitus   ? Dyspnea   ? GERD (gastroesophageal reflux disease)   ? occ  ? Headache(784.0)   ? migraines hx - couple/month  ? Hyperlipidemia   ? Hypertension   ? Osteoarthritis   ? fingers, hands  ? Osteopenia   ? PONV (postoperative nausea and vomiting)   ? ? ?Past Surgical History:  ?Procedure Laterality Date  ? ABDOMINAL HYSTERECTOMY    ? total  ? CHOLECYSTECTOMY  08/16/2012  ? Procedure: LAPAROSCOPIC CHOLECYSTECTOMY WITH INTRAOPERATIVE CHOLANGIOGRAM;  Surgeon: Wilmon ArmsMatthew K. Corliss Skainssuei, MD;  Location: [facility] OR;  Service: General;  Laterality: N/A;  ? KNEE ARTHROSCOPY  1990  ? L  ? KNEE CARTILAGE SURGERY  1992  ? L  ? OOPHORECTOMY    ? TOTAL KNEE ARTHROPLASTY Left 06/01/2007  ? TOTAL KNEE REVISION Left 08/13/2021  ? Procedure: LEFT TOTAL KNEE REVISION;  Surgeon: Kathryne HitchBlackman, Christopher Y, MD;  [facility];  Service: Orthopedics;  Laterality: Left;  ? ? ?Social History  ? ?Socioeconomic History  ? Marital status: Married  ?  Spouse name: Not on file  ? Number of children: Not on file  ? Years of education: Not on file  ? Highest education level: Not on file  ?Occupational History  ? Occupation: Finance  ?  Employer: [employer]  ?  Comment: Accounts Payable   ?Tobacco Use  ? Smoking status: Former  ?  Packs/day: 1.50  ?  Years: 25.00  ?  Pack years: 37.50  ?  Types: Cigarettes  ?  Quit date: 09/07/1992  ?  Years since quitting: 29.5  ? Smokeless tobacco: Never  ?Vaping Use  ? Vaping Use: Never used  ?Substance and Sexual Activity  ? Alcohol use: No  ? Drug use: No  ? Sexual activity: Not on file  ?Other Topics Concern  ? Not on file  ?Social History Narrative  ? Not on file  ? ?Social Determinants of Health  ? ?Financial Resource Strain: Not on file  ?Food Insecurity: Not on file  ?Transportation Needs: Not on file  ?Physical Activity: Not on file  ?Stress: Not on file  ?Social Connections: Not on file  ?Intimate Partner Violence: Not on file  ? ? ?Current Outpatient Medications on File Prior to Visit  ?Medication Sig Dispense Refill  ? albuterol (VENTOLIN HFA) 108 (90 Base) MCG/ACT inhaler Inhale 1-2 puffs into the lungs every 6 (six) hours as needed for wheezing or shortness of breath.    ? amLODipine (NORVASC) 5 MG tablet Take 1 tablet (5 mg total) by mouth daily. 90 tablet 0  ? aspirin 81 MG chewable tablet Chew 1 tablet (81  mg total) by mouth 2 (two) times daily. 60 tablet 0  ? gabapentin (NEURONTIN) 100 MG capsule Take 100 mg by mouth at bedtime.    ? gabapentin (NEURONTIN) 600 MG tablet Take 600 mg by mouth at bedtime.    ? glimepiride (AMARYL) 4 MG tablet Take 2 mg by mouth 2 (two) times daily with a meal.  0  ? losartan (COZAAR) 25 MG tablet Take 1 tablet (25 mg total) by mouth daily. 90 tablet 0  ? meloxicam (MOBIC) 15 MG tablet Take 1 tablet (15 mg total) by mouth daily. 90 tablet 3  ? metFORMIN (GLUCOPHAGE-XR) 500 MG 24 hr tablet Take 4 tablets (2,000 mg total) by mouth daily. 360 tablet 3  ? methocarbamol (ROBAXIN) 500 MG tablet Take 1 tablet (500 mg total) by mouth every 6 (six) hours as needed for muscle spasms. 40 tablet 1  ? ondansetron (ZOFRAN ODT) 4 MG disintegrating tablet Take 1 tablet (4 mg total) by mouth every 8 (eight) hours as needed for nausea  or vomiting. 20 tablet 0  ? Semaglutide (RYBELSUS) 7 MG TABS Take 7 mg by mouth daily. 90 tablet 3  ? simvastatin (ZOCOR) 40 MG tablet Take 40 mg by mouth every evening.    ? traMADol (ULTRAM) 50 MG tablet Take 50 mg by mouth every 8 (eight) hours as needed for moderate pain. For pain    ? traMADol (ULTRAM) 50 MG tablet Take 1-2 tablets (50-100 mg total) by mouth every 6 (six) hours as needed for moderate pain. 40 tablet 0  ? umeclidinium bromide (INCRUSE ELLIPTA) 62.5 MCG/INH AEPB Inhale 1 puff into the lungs daily as needed (shortness of breath).    ? ?No current facility-administered medications on file prior to visit.  ? ? ?Allergies  ?Allergen Reactions  ? Penicillins Anaphylaxis  ? Ace Inhibitors Cough  ? Cortisone Acetate   ?  REACTION: Makes pain worse  ? Tetracyclines & Related Nausea And Vomiting  ? ? ?Family History  ?Problem Relation Age of Onset  ? Heart attack Father   ? Coronary artery disease Father   ? Heart disease Father   ? Arthritis Mother   ?     RA, and osteoarthritis  ? Cancer Maternal Grandmother   ?     breast  ? Breast cancer Maternal Grandmother 50  ? Breast cancer Maternal Aunt 50  ? Diabetes Sister   ? Diabetes Other   ?     grandparents  ? Cancer Paternal Grandfather   ? ? ?BP (!) 150/66 (BP Location: Left Arm, Patient Position: Sitting, Cuff Size: Normal)   Pulse 80   Ht 5\' 5"  (1.651 m)   Wt 160 lb 12.8 oz (72.9 kg)   SpO2 98%   BMI 26.76 kg/m?  ? ?Review of Systems ? ?   ?Objective:  ? Physical Exam ?VITAL SIGNS:  See vs page ?GENERAL: no distress ? ? ?Lab Results  ?Component Value Date  ? HGBA1C 9.1 (A) 01/29/2022  ? ?Lab Results  ?Component Value Date  ? CREATININE 0.65 08/14/2021  ? BUN 11 08/14/2021  ? NA 136 08/14/2021  ? K 4.5 08/14/2021  ? CL 101 08/14/2021  ? CO2 24 08/14/2021  ? ?   ?Assessment & Plan:  ?Type 2 DM: uncontrolled.  Check fructosamine, to confirm.  ? ?Patient Instructions  ?check your blood sugar once a day.  vary the time of day when you check, between  before the 3 meals, and at bedtime.  also check  if you have symptoms of your blood sugar being too high or too low.  please keep a record of the readings and bring it to your next appointment here (or you can bring the meter itself).  You can write it on any piece of paper.  please call us sooner if your blood sugar goes below 70, or if most of your readings are over 200.   ?We will need to take this complex situation in stages.   ?I have sent a prescription to your pharmacy, for a new meter and strips.    ?A different type of diabetes blood test is requested for you again today.  We'll let you know about the results.   ?You should have an endocrinology follow-up appointment in 2 months.   ? ? ?

## 2022-03-19 NOTE — Patient Instructions (Addendum)
check your blood sugar once a day.  vary the time of day when you check, between before the 3 meals, and at bedtime.  also check if you have symptoms of your blood sugar being too high or too low.  please keep a record of the readings and bring it to your next appointment here (or you can bring the meter itself).  You can write it on any piece of paper.  please call us sooner if your blood sugar goes below 70, or if most of your readings are over 200.   ?We will need to take this complex situation in stages.   ?I have sent a prescription to your pharmacy, for a new meter and strips.    ?A different type of diabetes blood test is requested for you again today.  We'll let you know about the results.   ?You should have an endocrinology follow-up appointment in 2 months.   ?

## 2022-03-23 LAB — FRUCTOSAMINE: Fructosamine: 346 umol/L — ABNORMAL HIGH (ref 205–285)

## 2022-03-24 ENCOUNTER — Other Ambulatory Visit: Payer: Self-pay

## 2022-03-24 ENCOUNTER — Other Ambulatory Visit: Payer: Self-pay | Admitting: Endocrinology

## 2022-03-24 DIAGNOSIS — E119 Type 2 diabetes mellitus without complications: Secondary | ICD-10-CM

## 2022-03-24 MED ORDER — DAPAGLIFLOZIN PROPANEDIOL 5 MG PO TABS: 5.0000 mg | ORAL_TABLET | Freq: Every day | ORAL | 1 refills | Status: DC

## 2022-03-24 MED ORDER — ONETOUCH VERIO VI STRP: 1.0000 | ORAL_STRIP | Freq: Every day | 3 refills | Status: DC

## 2022-03-24 MED ORDER — ONETOUCH VERIO W/DEVICE KIT: PACK | 0 refills | Status: DC

## 2022-03-24 MED ORDER — ONETOUCH ULTRASOFT LANCETS MISC: 12 refills | Status: DC

## 2022-04-02 ENCOUNTER — Other Ambulatory Visit: Payer: Self-pay

## 2022-04-02 DIAGNOSIS — E119 Type 2 diabetes mellitus without complications: Secondary | ICD-10-CM

## 2022-04-02 MED ORDER — ONETOUCH VERIO VI STRP: 1.0000 | ORAL_STRIP | Freq: Every day | 3 refills | Status: DC

## 2022-04-09 ENCOUNTER — Ambulatory Visit: Payer: Medicare Other | Admitting: Orthopaedic Surgery

## 2022-04-21 ENCOUNTER — Ambulatory Visit: Payer: Medicare Other | Admitting: Orthopaedic Surgery

## 2022-04-23 ENCOUNTER — Ambulatory Visit (INDEPENDENT_AMBULATORY_CARE_PROVIDER_SITE_OTHER): Payer: Medicare Other | Admitting: Orthopaedic Surgery

## 2022-04-23 ENCOUNTER — Encounter: Payer: Self-pay | Admitting: Orthopaedic Surgery

## 2022-04-23 DIAGNOSIS — Z96652 Presence of left artificial knee joint: Secondary | ICD-10-CM

## 2022-04-23 DIAGNOSIS — M1711 Unilateral primary osteoarthritis, right knee: Secondary | ICD-10-CM

## 2022-04-23 DIAGNOSIS — M654 Radial styloid tenosynovitis [de Quervain]: Secondary | ICD-10-CM

## 2022-04-23 NOTE — Progress Notes (Signed)
Office Visit Note   Patient: Patricia Travis           Date of Birth: [birth year]           MRN: [id number] Visit Date: 04/23/2022              Requested by: Clayborn Heronankins, Victoria R, MD [address] [place],  [place] [id number] PCP: Clayborn Heronankins, Victoria R, MD   Assessment & Plan: Visit Diagnoses:  1. Unilateral primary osteoarthritis, right knee   2. Status post total left knee replacement   3. De Quervain's tenosynovitis, bilateral     Plan: Poorly controlled diabetes.  Therefore would recommend new removable wrist fragments with thumb spica that she will wear during the day only.  Voltaren gel up to 4 times daily 2 g dorsal extensor compartment bilateral thumbs.  Regards to her knees we will have her continue to work on quad strengthening.  Recommend compression stocking for the left lower leg.  Continue her Neurontin and Mobic.  Follow-up with us in 6 months AP and lateral views of the left knee at that time.  Questions were encouraged and answered at length  Follow-Up Instructions: Return in about 6 months (around 10/23/2022) for Radiographs.   Orders:  No orders of the defined types were placed in this encounter.  No orders of the defined types were placed in this encounter.     Procedures: No procedures performed   Clinical Data: No additional findings.   Subjective: Chief Complaint  Patient presents with   Left Knee - Follow-up    HPI Patricia Travis comes in today for follow-up of her left knee she is status post left total knee revision with poly exchange on 08/13/2021.  She states that her left knee overall is doing well denies any mechanical symptoms.  She still having swelling some numbness about the knee though.  Also notes numbness from her calf down into her left foot.  She questions if this is due to her diabetes.  She has been diabetic since 2000.  Most recent hemoglobin A1c on record was 9.1 on 01/29/2022.  She reports more recent hemoglobin A1c of 7.1 but  no documentation of this.  In regards to her right knee status post supplemental injection 8 weeks.  She has some discomfort with prolonged walking but describes no mechanical symptoms.  States her pain most the time is 1-2 out of 10 pain in the knee at worst 8 out of 10.  No new injury to either knee. She also would like to discuss possible carpal tunnel braces.  She reports that she had prior bracelet that immobilized the thumb for the left hand but it is worn out.  [place].  She is mostly having pain over the dorsal aspect of the left thumb describes some numbness tingling at times but only involves the dorsal aspect of the first compartment.  No numbness tingling into the hand otherwise.  She is on chronic Neurontin 700 mg daily and for migraines and also on Mobic.  Reports she tried Voltaren gel over the dorsal aspect of left thumb and it did not work.  She was applying it once daily and try this for 4 weeks.  Review of Systems See HPI  Objective: Vital Signs: There were no vitals taken for this visit.  Physical Exam Constitutional:      Appearance: She is not ill-appearing or diaphoretic.  Pulmonary:  Effort: Pulmonary effort is normal.  Neurological:     Mental Status: She is alert and oriented to person, place, and time.  Psychiatric:        Mood and Affect: Mood normal.    Ortho Exam Bilateral knees: Full extension full flexion.  No instability valgus varus stress in either knee.  Left knee anterior drawer is negative.  Left knee no abnormal warmth erythema surgical incisions well-healed.  Left calf supple nontender.Slight edema left lower leg compared to right lower leg.  Bilateral feet dorsal pedal pulses are 2+ and equal symmetric.  Sensation grossly intact bilateral feet to light touch. Bilateral hands full sensation throughout full motor.  Grip strengths are normal bilaterally.  Positive Finkelstein's  bilaterally.  Tenderness over the first extensor compartment bilaterally no abnormal warmth erythema or ecchymosis of either hand.  Tinel's is negative over the median nerve at the wrist bilaterally.  Specialty Comments:  No specialty comments available.  Imaging: No results found.   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) 08/13/2021   Status post revision of total replacement of left knee 08/13/2021   Unilateral primary osteoarthritis, right knee 01/10/2020   Left knee pain 12/31/2015   Chronic cholecystitis 08/05/2012   Left arm pain 09/25/2011   Left arm numbness 09/25/2011   Tinnitus 09/12/2011   MICROALBUMINURIA 01/03/2011   VITAMIN B12 DEFICIENCY 03/13/2010   PERIPHERAL NEUROPATHY 03/12/2010   COPD, MILD 08/23/2009   CARPAL TUNNEL SYNDROME, LEFT 05/08/2009   ARTHRITIS, CARPOMETACARPAL JOINT 05/08/2009   HYPERLIPIDEMIA 02/07/2007   MIGRAINE, CLASSICAL W/O INTRACTABLE MIGRAINE 02/07/2007   HYPERTENSION 02/07/2007   GERD 02/07/2007   OSTEOARTHRITIS 02/07/2007   KNEE PAIN, LEFT, CHRONIC 02/07/2007   OSTEOPENIA 02/07/2007   Past Medical History:  Diagnosis Date   Anemia    hx- high school   Asthma    Complication of anesthesia    problem being combative awakening from  hysterectomy   79 but had total knee 08 and did fine   COPD (chronic obstructive pulmonary disease) (HCC)    Diabetes mellitus    Dyspnea    GERD (gastroesophageal reflux disease)    occ   Headache(784.0)    migraines hx - couple/month   Hyperlipidemia    Hypertension    Osteoarthritis    fingers, hands   Osteopenia    PONV (postoperative nausea and vomiting)     Family History  Problem Relation Age of Onset   Heart attack Father    Coronary artery disease Father    Heart disease Father    Arthritis Mother        RA, and osteoarthritis   Cancer Maternal Grandmother        breast    Breast cancer Maternal Grandmother 51   Breast cancer Maternal Aunt 52   Diabetes Sister    Diabetes Other        grandparents   Cancer Paternal Grandfather     Past Surgical History:  Procedure Laterality Date   ABDOMINAL HYSTERECTOMY     total   CHOLECYSTECTOMY  08/16/2012   Procedure: LAPAROSCOPIC CHOLECYSTECTOMY WITH INTRAOPERATIVE CHOLANGIOGRAM;  Surgeon: Wilmon Arms. Corliss Skains, MD;  Location: [facility] OR;  Service: General;  Laterality: N/A;   KNEE ARTHROSCOPY  1990   L   KNEE CARTILAGE SURGERY  1992   L   OOPHORECTOMY     TOTAL KNEE ARTHROPLASTY Left 06/01/2007  TOTAL KNEE REVISION Left 08/13/2021   Procedure: LEFT TOTAL KNEE REVISION;  Surgeon: Kathryne Hitch, MD;  Location: [facility] OR;  Service: Orthopedics;  Laterality: Left;   Social History   Occupational History   Occupation: Civil Service fast streamer: [employer]    Comment: Accounts Payable  Tobacco Use   Smoking status: Former    Packs/day: 1.50    Years: 25.00    Pack years: 37.50    Types: Cigarettes    Quit date: 09/07/1992    Years since quitting: 29.6   Smokeless tobacco: Never  Vaping Use   Vaping Use: Never used  Substance and Sexual Activity   Alcohol use: No   Drug use: No   Sexual activity: Not on file

## 2022-04-28 ENCOUNTER — Other Ambulatory Visit: Payer: Self-pay

## 2022-04-28 DIAGNOSIS — E119 Type 2 diabetes mellitus without complications: Secondary | ICD-10-CM

## 2022-04-28 MED ORDER — RYBELSUS 7 MG PO TABS
7.0000 mg | ORAL_TABLET | Freq: Every day | ORAL | 3 refills | Status: DC
Start: 2022-04-28 — End: 2024-03-01

## 2022-05-09 ENCOUNTER — Other Ambulatory Visit: Payer: Self-pay

## 2022-05-09 DIAGNOSIS — E119 Type 2 diabetes mellitus without complications: Secondary | ICD-10-CM

## 2022-05-09 MED ORDER — ONETOUCH VERIO W/DEVICE KIT: PACK | 0 refills | Status: AC

## 2022-05-26 ENCOUNTER — Ambulatory Visit (INDEPENDENT_AMBULATORY_CARE_PROVIDER_SITE_OTHER): Payer: Medicare Other | Admitting: Endocrinology

## 2022-05-26 ENCOUNTER — Encounter: Payer: Self-pay | Admitting: Endocrinology

## 2022-05-26 VITALS — BP 130/80 | HR 74 | Ht 65.0 in | Wt 160.2 lb

## 2022-05-26 DIAGNOSIS — I10 Essential (primary) hypertension: Secondary | ICD-10-CM | POA: Diagnosis not present

## 2022-05-26 DIAGNOSIS — E119 Type 2 diabetes mellitus without complications: Secondary | ICD-10-CM | POA: Diagnosis not present

## 2022-05-26 DIAGNOSIS — E1165 Type 2 diabetes mellitus with hyperglycemia: Secondary | ICD-10-CM

## 2022-05-26 LAB — MICROALBUMIN / CREATININE URINE RATIO
Creatinine,U: 46.9 mg/dL
Microalb Creat Ratio: 1.5 mg/g (ref 0.0–30.0)
Microalb, Ur: <0.7 mg/dL (ref 0.0–1.9)

## 2022-05-26 LAB — URINALYSIS, ROUTINE W REFLEX MICROSCOPIC
Bilirubin Urine: NEGATIVE
Hgb urine dipstick: NEGATIVE
Ketones, ur: NEGATIVE
Nitrite: POSITIVE — AB
RBC / HPF: NONE SEEN (ref 0–?)
Specific Gravity, Urine: 1.01 (ref 1.000–1.030)
Total Protein, Urine: NEGATIVE
Urine Glucose: NEGATIVE
Urobilinogen, UA: 0.2 (ref 0.0–1.0)
pH: 6 (ref 5.0–8.0)

## 2022-05-26 LAB — POCT GLUCOSE (DEVICE FOR HOME USE): POC Glucose: 145 mg/dl — AB (ref 70–99)

## 2022-05-26 LAB — BASIC METABOLIC PANEL
BUN: 12 mg/dL (ref 6–23)
CO2: 25 mEq/L (ref 19–32)
Calcium: 9.1 mg/dL (ref 8.4–10.5)
Chloride: 102 mEq/L (ref 96–112)
Creatinine, Ser: 0.64 mg/dL (ref 0.40–1.20)
GFR: 88.29 mL/min (ref >60.00)
Glucose, Bld: 145 mg/dL — ABNORMAL HIGH (ref 70–99)
Potassium: 4.4 mEq/L (ref 3.5–5.1)
Sodium: 139 mEq/L (ref 135–145)

## 2022-05-26 LAB — POCT GLYCOSYLATED HEMOGLOBIN (HGB A1C): Hemoglobin A1C: 9 % — AB (ref 4.0–5.6)

## 2022-05-26 NOTE — Patient Instructions (Addendum)
Check blood sugars on waking up 2-3 days a week  Also check blood sugars about 2 hours after meals and do this after different meals by rotation  Recommended blood sugar levels on waking up are 90-130 and about 2 hours after meal is 130-180  Please bring your blood sugar monitor to each visit, thank you  RYBELSUS: This needs to be reduced temporarily to 3 mg with a sample that you will take today and take this 30 minutes before you are planning to eat lunch and not in the morning  Take FARXIGA with lunch and if your labs are okay today we will increase the dose to 2 of the 5 mg tablets we will send a prescription for 10 mg daily  We will be sending referral to the [facility] nutrition and diabetes education program

## 2022-05-26 NOTE — Progress Notes (Signed)
Patient ID: Patricia Travis, female   DOB: 14-Aug-1951, 71 y.o.   MRN: [id number]           Reason for Appointment: Type II Diabetes follow-up   History of Present Illness   Diagnosis date: 2009  Previous history:  Non-insulin hypoglycemic drugs previously used:  A1c range in the last few years is increasing for at least a year, previously below 8 from PCP records  Recent history:     Non-insulin hypoglycemic drugs: Glipizide ER 5 mg twice daily, Rybelsus 7 mg, metformin 2 g, Farxiga 5 mg       Side effects from medications: Nausea from Rybelsus?  Current self management, blood sugar patterns and problems identified:  A1c is 9%, apparently A1c was 10% in April with PCP She was started on Rybelsus in March this year apparently with 7 mg which she takes in the morning, unclear whether she took 3 mg initially or not She thinks that she has had nausea for a couple of months and has nausea most of the time She thinks that [place] was causing this nausea However she takes Rybelsus on waking up and not eating usually until lunchtime Although she thinks she was started on FARXIGA also at the same time, the first prescription for Wilder Glade was sent on 03/24/2022 She thinks her blood sugars are no more than 150 compared to her fasting readings of 130 this morning her glucose in the office is 145 She drinks regular soft drinks and did not think she can cut back on these She refuses to take any injectable medications No recent weight change  Exercise: none, limited by knee pain  Diet management: Usually not eating breakfast or only a toast She drinks 2 regular soft drinks a day and cannot cut back anymore  Hypoglycemia:  none    Glucometer: One Touch.           Blood Glucose readings from recall:   PRE-MEAL Fasting Lunch Dinner Bedtime Overall  Glucose range: 120-150   120-150   Mean/median:        POST-MEAL PC Breakfast PC Lunch PC Dinner  Glucose range:  ?   Mean/median:        Dietician visit: Most recent:  never    Weight control:  Wt Readings from Last 3 Encounters:  05/26/22 160 lb 3.2 oz (72.7 kg)  03/19/22 160 lb 12.8 oz (72.9 kg)  01/29/22 164 lb 12.8 oz (74.8 kg)            Diabetes labs:  Lab Results  Component Value Date   HGBA1C 9.0 (A) 05/26/2022   HGBA1C 9.1 (A) 01/29/2022   HGBA1C 6.5 09/08/2011   Lab Results  Component Value Date   MICROALBUR 2.3 (H) 12/30/2010   LDLCALC 66 12/30/2010   CREATININE 0.65 08/14/2021     Allergies as of 05/26/2022       Reactions   Penicillins Anaphylaxis   Ace Inhibitors Cough   Cortisone Acetate    REACTION: Makes pain worse   Tetracyclines & Related Nausea And Vomiting        Medication List        Accurate as of May 26, 2022  1:06 PM. If you have any questions, ask your nurse or doctor.          STOP taking these medications    glimepiride 4 MG tablet Commonly known as: AMARYL Stopped by: Patricia Snare, MD       TAKE these medications  albuterol 108 (90 Base) MCG/ACT inhaler Commonly known as: VENTOLIN HFA Inhale 1-2 puffs into the lungs every 6 (six) hours as needed for wheezing or shortness of breath.   amLODipine 5 MG tablet Commonly known as: NORVASC Take 1 tablet (5 mg total) by mouth daily.   aspirin 81 MG chewable tablet Chew 1 tablet (81 mg total) by mouth 2 (two) times daily.   dapagliflozin propanediol 5 MG Tabs tablet Commonly known as: Farxiga Take 1 tablet (5 mg total) by mouth daily before breakfast.   gabapentin 600 MG tablet Commonly known as: NEURONTIN Take 600 mg by mouth at bedtime.   gabapentin 100 MG capsule Commonly known as: NEURONTIN Take 100 mg by mouth at bedtime.   glipiZIDE 5 MG 24 hr tablet Commonly known as: GLUCOTROL XL Take 5 mg by mouth 2 (two) times daily.   Incruse Ellipta 62.5 MCG/ACT Aepb Generic drug: umeclidinium bromide Inhale 1 puff into the lungs daily as needed (shortness of breath).   losartan 25 MG  tablet Commonly known as: COZAAR Take 1 tablet (25 mg total) by mouth daily.   meloxicam 15 MG tablet Commonly known as: MOBIC Take 1 tablet (15 mg total) by mouth daily.   metFORMIN 500 MG 24 hr tablet Commonly known as: GLUCOPHAGE-XR Take 4 tablets (2,000 mg total) by mouth daily.   methocarbamol 500 MG tablet Commonly known as: ROBAXIN Take 1 tablet (500 mg total) by mouth every 6 (six) hours as needed for muscle spasms.   ondansetron 4 MG disintegrating tablet Commonly known as: Zofran ODT Take 1 tablet (4 mg total) by mouth every 8 (eight) hours as needed for nausea or vomiting.   onetouch ultrasoft lancets Use as instructed 1 daily   OneTouch Verio test strip Generic drug: glucose blood 1 each by Other route daily.   OneTouch Verio w/Device Kit Use as instructed to check blood sugars   Rybelsus 7 MG Tabs Generic drug: Semaglutide Take 7 mg by mouth daily.   simvastatin 40 MG tablet Commonly known as: ZOCOR Take 40 mg by mouth every evening.   traMADol 50 MG tablet Commonly known as: ULTRAM Take 1-2 tablets (50-100 mg total) by mouth every 6 (six) hours as needed for moderate pain. What changed: Another medication with the same name was removed. Continue taking this medication, and follow the directions you see here. Changed by: Patricia Snare, MD        Allergies:  Allergies  Allergen Reactions   Penicillins Anaphylaxis   Ace Inhibitors Cough   Cortisone Acetate     REACTION: Makes pain worse   Tetracyclines & Related Nausea And Vomiting    Past Medical History:  Diagnosis Date   Anemia    hx- high school   Asthma    Complication of anesthesia    problem being combative awakening from  hysterectomy   79 but had total knee 08 and did fine   COPD (chronic obstructive pulmonary disease) (HCC)    Diabetes mellitus    Dyspnea    GERD (gastroesophageal reflux disease)    occ   Headache(784.0)    migraines hx - couple/month   Hyperlipidemia     Hypertension    Osteoarthritis    fingers, hands   Osteopenia    PONV (postoperative nausea and vomiting)     Past Surgical History:  Procedure Laterality Date   ABDOMINAL HYSTERECTOMY     total   CHOLECYSTECTOMY  08/16/2012   Procedure: LAPAROSCOPIC CHOLECYSTECTOMY WITH INTRAOPERATIVE CHOLANGIOGRAM;  Surgeon: Imogene Burn. Georgette Dover, MD;  Location: [facility];  Service: General;  Laterality: N/A;   KNEE ARTHROSCOPY  [place]   L   OOPHORECTOMY     TOTAL KNEE ARTHROPLASTY Left 06/01/2007   TOTAL KNEE REVISION Left 08/13/2021   Procedure: LEFT TOTAL KNEE REVISION;  Surgeon: Mcarthur Rossetti, MD;  Location: [facility];  Service: Orthopedics;  Laterality: Left;    Family History  Problem Relation Age of Onset   Heart attack Father    Coronary artery disease Father    Heart disease Father    Arthritis Mother        RA, and osteoarthritis   Cancer Maternal Grandmother        breast   Breast cancer Maternal Grandmother 71   Breast cancer Maternal Aunt 70   Diabetes Sister    Diabetes Other        grandparents   Cancer Paternal Grandfather     Social History:  reports that she quit smoking about 29 years ago. Her smoking use included cigarettes. She has a 37.50 pack-year smoking history. She has never used smokeless tobacco. She reports that she does not drink alcohol and does not use drugs.  Review of Systems:  Last diabetic eye exam date: 05/2022  Last foot exam date: 8/21  Symptoms of neuropathy: none  Hypertension:   Treatment includes losartan 25 mg daily Also on amlodipine  BP Readings from Last 3 Encounters:  05/26/22 130/80  03/19/22 (!) 150/66  01/29/22 (!) 150/72    Lipid management:  Currently on simvastatin 40 mg likely from PCP with last LDL of 81 as of 4/23    Lab Results  Component Value Date   CHOL 135 12/30/2010   CHOL 136 06/19/2010   CHOL 152 03/07/2010   Lab Results  Component Value Date   HDL 42.00 12/30/2010    HDL 41.40 06/19/2010   HDL 45.40 03/07/2010   Lab Results  Component Value Date   LDLCALC 66 12/30/2010   LDLCALC 62 06/19/2010   LDLCALC 70 03/07/2010   Lab Results  Component Value Date   TRIG 136.0 12/30/2010   TRIG 162.0 (H) 06/19/2010   TRIG 184.0 (H) 03/07/2010   Lab Results  Component Value Date   CHOLHDL 3 12/30/2010   CHOLHDL 3 06/19/2010   CHOLHDL 3 03/07/2010   No results found for: "LDLDIRECT"   Examination:   BP 130/80 (BP Location: Left Arm, Patient Position: Sitting, Cuff Size: Small)   Pulse 74   Ht '5\' 5"'  (1.651 m)   Wt 160 lb 3.2 oz (72.7 kg)   SpO2 94%   BMI 26.66 kg/m   Body mass index is 26.66 kg/m.   Diabetic Foot Exam - Simple   Simple Foot Form Diabetic Foot exam was performed with the following findings: Yes   Visual Inspection No deformities, no ulcerations, no other skin breakdown bilaterally: Yes Sensation Testing Intact to touch and monofilament testing bilaterally: Yes Pulse Check Posterior Tibialis and Dorsalis pulse intact bilaterally: Yes Comments     No pedal edema present  ASSESSMENT/ PLAN:    Diabetes type 2 non-insulin-dependent:   Current regimen: Rybelsus, metformin, Farxiga and glipizide  See history of present illness for detailed discussion of current diabetes management, blood sugar patterns and problems identified  A1c is now up to 9%  Blood glucose control is worse recently and indicates progression of her diabetes Unclear how often she is checking  her sugar and if this meter is accurate since A1c has been 9% and she did not think her sugars are over 150 at home She can do significantly better with her diet especially cutting back on regular soft drinks Currently not able to exercise much and she is reluctant to do any water exercises  Foot exam today does not indicate any signs of neuropathy, recent eye exam was not available but will need evaluation of urine microalbumin  Plan:  She will need to bring  her monitor for download in the next visit for comparison and verify how often she is checking it after meals Encouraged her to start water exercises but she does not feel like she wants to She will be referred to the diabetes education program in [place] especially for nutritional counseling and review of her medication compliance If her renal function and urinalysis are normal today will increase her FARXIGA to 10 mg We will also temporarily go down 3 mg on Rybelsus since this is likely causing her nausea She will be given a sample and she will try taking this 30 minutes before lunch rather than in the morning without any breakfast If tolerated well she will subsequently go to 7 mg Rybelsus Although she is likely going to benefit from an injectable drug such as Mounjaro or Trulicity she completely refuses to consider injectable drugs even after showing the autoinjector  Continue follow-up with PCP for hypertension and hypercholesterolemia  She will have her eye exam report forwarded to [place] from ophthalmologist from our upcoming exam  There are no Patient Instructions on file for this visit.  Total visit time for evaluation and management and counseling = 30 minutes  Erlin Gardella 05/26/2022, 1:06 PM

## 2022-06-11 ENCOUNTER — Telehealth: Payer: Self-pay | Admitting: Orthopaedic Surgery

## 2022-06-11 ENCOUNTER — Other Ambulatory Visit: Payer: Self-pay | Admitting: Orthopaedic Surgery

## 2022-06-11 MED ORDER — ONDANSETRON 4 MG PO TBDP: 4.0000 mg | ORAL_TABLET | Freq: Three times a day (TID) | ORAL | 0 refills | Status: DC | PRN

## 2022-06-11 NOTE — Telephone Encounter (Signed)
Patient called needing Rx refilled Ondansetron. The number to contact patient is [phone]

## 2022-06-11 NOTE — Telephone Encounter (Signed)
Please advise 

## 2022-07-04 ENCOUNTER — Telehealth: Payer: Self-pay | Admitting: Orthopaedic Surgery

## 2022-07-04 NOTE — Telephone Encounter (Signed)
Pt called for a gel injection in her right knee.

## 2022-07-08 NOTE — Telephone Encounter (Signed)
VOB submitted for Monovisc, right knee.   Next available gel injection will need to be after 07/30/2022.

## 2022-07-22 ENCOUNTER — Ambulatory Visit (INDEPENDENT_AMBULATORY_CARE_PROVIDER_SITE_OTHER): Payer: Medicare Other

## 2022-07-22 ENCOUNTER — Ambulatory Visit (INDEPENDENT_AMBULATORY_CARE_PROVIDER_SITE_OTHER): Payer: Medicare Other | Admitting: Podiatry

## 2022-07-22 DIAGNOSIS — M722 Plantar fascial fibromatosis: Secondary | ICD-10-CM

## 2022-07-22 MED ORDER — MELOXICAM 15 MG PO TABS
15.0000 mg | ORAL_TABLET | Freq: Every day | ORAL | 2 refills | Status: DC
Start: 2022-07-22 — End: 2023-02-24

## 2022-07-22 NOTE — Progress Notes (Signed)
   Chief Complaint  Patient presents with   Foot Pain    Patient is here complaining of bilateral foot pain.    Subjective: 71 year old female with PMHx of DM presenting today for follow up evaluation of bilateral plantar fasciitis.  Patient states that the meloxicam helped significantly.  She also wears good supportive shoes and sneakers.  She presents for follow-up treatment and evaluation  Past Medical History:  Diagnosis Date   Anemia    hx- high school   Asthma    Complication of anesthesia    problem being combative awakening from  hysterectomy   79 but had total knee 08 and did fine   COPD (chronic obstructive pulmonary disease) (HCC)    Diabetes mellitus    Dyspnea    GERD (gastroesophageal reflux disease)    occ   Headache(784.0)    migraines hx - couple/month   Hyperlipidemia    Hypertension    Osteoarthritis    fingers, hands   Osteopenia    PONV (postoperative nausea and vomiting)      Objective: Physical Exam General: The patient is alert and oriented x3 in no acute distress.  Dermatology: Skin is warm, dry and supple bilateral lower extremities. Negative for open lesions or macerations bilateral.   Vascular: Dorsalis Pedis and Posterior Tibial pulses palpable bilateral.  Capillary fill time is immediate to all digits.  Neurological: Epicritic and protective threshold intact bilateral.   Musculoskeletal: There continues to be some moderate tenderness to palpation to the plantar aspect of the bilateral heels along the plantar fascia; to the anterior, medial and lateral aspects of the bilateral ankles; to the midfoot bilaterally. All other joints range of motion within normal limits bilateral. Strength 5/5 in all groups bilateral.    Assessment: 1. plantar fasciitis bilateral feet   Plan of Care:  1. Patient evaluated. 2. Refill prescription for Meloxicam provided to patient.  3. Declined injection today.  4.  Continue wearing good supportive shoes and  sandals advise against going barefoot 5.  Return to clinic as needed  Felecia Shelling, DPM [facility]  Dr. Felecia Shelling, DPM    [address] [place], [place] [address]                Office [phone]  Fax [phone]

## 2022-07-25 DIAGNOSIS — E118 Type 2 diabetes mellitus with unspecified complications: Secondary | ICD-10-CM | POA: Diagnosis not present

## 2022-07-25 DIAGNOSIS — Z96652 Presence of left artificial knee joint: Secondary | ICD-10-CM | POA: Diagnosis not present

## 2022-07-25 DIAGNOSIS — J4530 Mild persistent asthma, uncomplicated: Secondary | ICD-10-CM | POA: Diagnosis not present

## 2022-07-25 DIAGNOSIS — R110 Nausea: Secondary | ICD-10-CM | POA: Diagnosis not present

## 2022-07-25 DIAGNOSIS — M19042 Primary osteoarthritis, left hand: Secondary | ICD-10-CM | POA: Diagnosis not present

## 2022-07-25 DIAGNOSIS — I10 Essential (primary) hypertension: Secondary | ICD-10-CM | POA: Diagnosis not present

## 2022-07-25 DIAGNOSIS — Z1389 Encounter for screening for other disorder: Secondary | ICD-10-CM | POA: Diagnosis not present

## 2022-07-25 DIAGNOSIS — M19041 Primary osteoarthritis, right hand: Secondary | ICD-10-CM | POA: Diagnosis not present

## 2022-07-25 DIAGNOSIS — E782 Mixed hyperlipidemia: Secondary | ICD-10-CM | POA: Diagnosis not present

## 2022-07-30 ENCOUNTER — Other Ambulatory Visit: Payer: Self-pay

## 2022-07-30 DIAGNOSIS — M1711 Unilateral primary osteoarthritis, right knee: Secondary | ICD-10-CM

## 2022-08-18 ENCOUNTER — Ambulatory Visit (INDEPENDENT_AMBULATORY_CARE_PROVIDER_SITE_OTHER): Payer: Medicare Other | Admitting: Orthopaedic Surgery

## 2022-08-18 ENCOUNTER — Encounter: Payer: Self-pay | Admitting: Orthopaedic Surgery

## 2022-08-18 DIAGNOSIS — M1711 Unilateral primary osteoarthritis, right knee: Secondary | ICD-10-CM

## 2022-08-18 MED ORDER — HYALURONAN 88 MG/4ML IX SOSY
88.0000 mg | PREFILLED_SYRINGE | INTRA_ARTICULAR | Status: AC | PRN
  Administered 2022-08-18: 88 mg via INTRA_ARTICULAR

## 2022-08-18 NOTE — Progress Notes (Signed)
   Procedure Note  Patient: Patricia Travis             Date of Birth: May 08, 1951           MRN: [id number]             Visit Date: 08/18/2022  Procedures: Visit Diagnoses:  1. Unilateral primary osteoarthritis, right knee     Large Joint Inj: R knee on 08/18/2022 8:17 AM Indications: diagnostic evaluation and pain Details: 22 G 1.5 in needle, superolateral approach  Arthrogram: No  Medications: 88 mg Hyaluronan 88 MG/4ML Outcome: tolerated well, no immediate complications Procedure, treatment alternatives, risks and benefits explained, specific risks discussed. Consent was given by the patient. Immediately prior to procedure a time out was called to verify the correct patient, procedure, equipment, support staff and site/side marked as required. Patient was prepped and draped in the usual sterile fashion.    The patient is well-known to me.  She comes in today for hyaluronic acid for her right knee.  She has had this before and it lasted for about 6 months.  She has had no acute change in her medical status.  We are placing Monovisc in her knee today.  Examination right knee shows no effusion.  I did place Monovisc in her right knee today without difficulty.  All questions and concerns were answered and addressed.  Follow-up is as needed.  SEG#[id number]

## 2022-08-19 NOTE — Progress Notes (Deleted)
Patient ID: Patricia Travis, female   DOB: 24-Jan-1951, 71 y.o.   MRN: [id number]           Reason for Appointment: Type II Diabetes follow-up   History of Present Illness   Diagnosis date: 2009  Previous history:  Non-insulin hypoglycemic drugs previously used:  A1c range in the last few years is increasing for at least a year, previously below 8 from PCP records  Recent history:     Non-insulin hypoglycemic drugs: Glipizide ER 5 mg twice daily, Rybelsus 7 mg, metformin 2 g, Farxiga 5 mg       Side effects from medications: Nausea from Rybelsus?  Current self management, blood sugar patterns and problems identified:  A1c is 9%, apparently A1c was 10% in April with PCP She was started on Rybelsus in March this year apparently with 7 mg which she takes in the morning, unclear whether she took 3 mg initially or not She thinks that she has had nausea for a couple of months and has nausea most of the time She thinks that [place] was causing this nausea However she takes Rybelsus on waking up and not eating usually until lunchtime Although she thinks she was started on FARXIGA also at the same time, the first prescription for Wilder Glade was sent on 03/24/2022 She thinks her blood sugars are no more than 150 compared to her fasting readings of 130 this morning her glucose in the office is 145 She drinks regular soft drinks and did not think she can cut back on these She refuses to take any injectable medications No recent weight change  Exercise: none, limited by knee pain  Diet management: Usually not eating breakfast or only a toast She drinks 2 regular soft drinks a day and cannot cut back anymore  Hypoglycemia:  none    Glucometer: One Touch.           Blood Glucose readings from recall:   PRE-MEAL Fasting Lunch Dinner Bedtime Overall  Glucose range: 120-150   120-150   Mean/median:        POST-MEAL PC Breakfast PC Lunch PC Dinner  Glucose range:  ?   Mean/median:        Dietician visit: Most recent:  never    Weight control:  Wt Readings from Last 3 Encounters:  05/26/22 160 lb 3.2 oz (72.7 kg)  03/19/22 160 lb 12.8 oz (72.9 kg)  01/29/22 164 lb 12.8 oz (74.8 kg)            Diabetes labs:  Lab Results  Component Value Date   HGBA1C 9.0 (A) 05/26/2022   HGBA1C 9.1 (A) 01/29/2022   HGBA1C 6.5 09/08/2011   Lab Results  Component Value Date   MICROALBUR <0.7 05/26/2022   LDLCALC 66 12/30/2010   CREATININE 0.64 05/26/2022     Allergies as of 08/20/2022       Reactions   Penicillins Anaphylaxis   Ace Inhibitors Cough   Cortisone Acetate    REACTION: Makes pain worse   Tetracyclines & Related Nausea And Vomiting        Medication List        Accurate as of August 19, 2022  8:31 PM. If you have any questions, ask your nurse or doctor.          albuterol 108 (90 Base) MCG/ACT inhaler Commonly known as: VENTOLIN HFA Inhale 1-2 puffs into the lungs every 6 (six) hours as needed for wheezing or shortness of breath.   amLODipine  5 MG tablet Commonly known as: NORVASC Take 1 tablet (5 mg total) by mouth daily.   aspirin 81 MG chewable tablet Chew 1 tablet (81 mg total) by mouth 2 (two) times daily.   dapagliflozin propanediol 5 MG Tabs tablet Commonly known as: Farxiga Take 1 tablet (5 mg total) by mouth daily before breakfast.   gabapentin 600 MG tablet Commonly known as: NEURONTIN Take 600 mg by mouth at bedtime.   gabapentin 100 MG capsule Commonly known as: NEURONTIN Take 100 mg by mouth at bedtime.   glipiZIDE 5 MG 24 hr tablet Commonly known as: GLUCOTROL XL Take 5 mg by mouth 2 (two) times daily.   Incruse Ellipta 62.5 MCG/ACT Aepb Generic drug: umeclidinium bromide Inhale 1 puff into the lungs daily as needed (shortness of breath).   losartan 25 MG tablet Commonly known as: COZAAR Take 1 tablet (25 mg total) by mouth daily.   meloxicam 15 MG tablet Commonly known as: MOBIC Take 1 tablet (15 mg  total) by mouth daily.   metFORMIN 500 MG 24 hr tablet Commonly known as: GLUCOPHAGE-XR Take 4 tablets (2,000 mg total) by mouth daily.   methocarbamol 500 MG tablet Commonly known as: ROBAXIN Take 1 tablet (500 mg total) by mouth every 6 (six) hours as needed for muscle spasms.   ondansetron 4 MG disintegrating tablet Commonly known as: Zofran ODT Take 1 tablet (4 mg total) by mouth every 8 (eight) hours as needed for nausea or vomiting.   onetouch ultrasoft lancets Use as instructed 1 daily   OneTouch Verio test strip Generic drug: glucose blood 1 each by Other route daily.   OneTouch Verio w/Device Kit Use as instructed to check blood sugars   Rybelsus 7 MG Tabs Generic drug: Semaglutide Take 7 mg by mouth daily.   simvastatin 40 MG tablet Commonly known as: ZOCOR Take 40 mg by mouth every evening.   traMADol 50 MG tablet Commonly known as: ULTRAM Take 1-2 tablets (50-100 mg total) by mouth every 6 (six) hours as needed for moderate pain.        Allergies:  Allergies  Allergen Reactions   Penicillins Anaphylaxis   Ace Inhibitors Cough   Cortisone Acetate     REACTION: Makes pain worse   Tetracyclines & Related Nausea And Vomiting    Past Medical History:  Diagnosis Date   Anemia    hx- high school   Asthma    Complication of anesthesia    problem being combative awakening from  hysterectomy   79 but had total knee 08 and did fine   COPD (chronic obstructive pulmonary disease) (HCC)    Diabetes mellitus    Dyspnea    GERD (gastroesophageal reflux disease)    occ   Headache(784.0)    migraines hx - couple/month   Hyperlipidemia    Hypertension    Osteoarthritis    fingers, hands   Osteopenia    PONV (postoperative nausea and vomiting)     Past Surgical History:  Procedure Laterality Date   ABDOMINAL HYSTERECTOMY     total   CHOLECYSTECTOMY  08/16/2012   Procedure: LAPAROSCOPIC CHOLECYSTECTOMY WITH INTRAOPERATIVE CHOLANGIOGRAM;  Surgeon:  Imogene Burn. Georgette Dover, MD;  Location: [facility];  Service: General;  Laterality: N/A;   KNEE ARTHROSCOPY  [place]   L   OOPHORECTOMY     TOTAL KNEE ARTHROPLASTY Left 06/01/2007   TOTAL KNEE REVISION Left 08/13/2021   Procedure: LEFT TOTAL  KNEE REVISION;  Surgeon: Mcarthur Rossetti, MD;  Location: [facility];  Service: Orthopedics;  Laterality: Left;    Family History  Problem Relation Age of Onset   Heart attack Father    Coronary artery disease Father    Heart disease Father    Arthritis Mother        RA, and osteoarthritis   Cancer Maternal Grandmother        breast   Breast cancer Maternal Grandmother 52   Breast cancer Maternal Aunt 70   Diabetes Sister    Diabetes Other        grandparents   Cancer Paternal Grandfather     Social History:  reports that she quit smoking about 29 years ago. Her smoking use included cigarettes. She has a 37.50 pack-year smoking history. She has never used smokeless tobacco. She reports that she does not drink alcohol and does not use drugs.  Review of Systems:  Last diabetic eye exam date: 05/2022  Last foot exam date: 8/21  Symptoms of neuropathy: none  Hypertension:   Treatment includes losartan 25 mg daily Also on amlodipine  BP Readings from Last 3 Encounters:  05/26/22 130/80  03/19/22 (!) 150/66  01/29/22 (!) 150/72    Lipid management:  Currently on simvastatin 40 mg likely from PCP with last LDL of 81 as of 4/23    Lab Results  Component Value Date   CHOL 135 12/30/2010   CHOL 136 06/19/2010   CHOL 152 03/07/2010   Lab Results  Component Value Date   HDL 42.00 12/30/2010   HDL 41.40 06/19/2010   HDL 45.40 03/07/2010   Lab Results  Component Value Date   LDLCALC 66 12/30/2010   LDLCALC 62 06/19/2010   LDLCALC 70 03/07/2010   Lab Results  Component Value Date   TRIG 136.0 12/30/2010   TRIG 162.0 (H) 06/19/2010   TRIG 184.0 (H) 03/07/2010   Lab Results  Component Value Date    CHOLHDL 3 12/30/2010   CHOLHDL 3 06/19/2010   CHOLHDL 3 03/07/2010   No results found for: "LDLDIRECT"   Examination:   There were no vitals taken for this visit.  There is no height or weight on file to calculate BMI.   Diabetic Foot Exam - Simple   No data filed     No pedal edema present  ASSESSMENT/ PLAN:    Diabetes type 2 non-insulin-dependent:   Current regimen: Rybelsus, metformin, Farxiga and glipizide  See history of present illness for detailed discussion of current diabetes management, blood sugar patterns and problems identified  A1c is now up to 9%  Blood glucose control is worse recently and indicates progression of her diabetes Unclear how often she is checking her sugar and if this meter is accurate since A1c has been 9% and she did not think her sugars are over 150 at home She can do significantly better with her diet especially cutting back on regular soft drinks Currently not able to exercise much and she is reluctant to do any water exercises  Foot exam today does not indicate any signs of neuropathy, recent eye exam was not available but will need evaluation of urine microalbumin  Plan:  She will need to bring her monitor for download in the next visit for comparison and verify how often she is checking it after meals Encouraged her to start water exercises but she does not feel like she wants to She will be referred to the diabetes education program in  [place] especially for nutritional counseling and review of her medication compliance If her renal function and urinalysis are normal today will increase her FARXIGA to 10 mg We will also temporarily go down 3 mg on Rybelsus since this is likely causing her nausea She will be given a sample and she will try taking this 30 minutes before lunch rather than in the morning without any breakfast If tolerated well she will subsequently go to 7 mg Rybelsus Although she is likely going to benefit from an  injectable drug such as Mounjaro or Trulicity she completely refuses to consider injectable drugs even after showing the autoinjector  Continue follow-up with PCP for hypertension and hypercholesterolemia  She will have her eye exam report forwarded to [place] from ophthalmologist from our upcoming exam  There are no Patient Instructions on file for this visit.  Total visit time for evaluation and management and counseling = 30 minutes  [name] 08/19/2022, 8:31 PM

## 2022-08-20 ENCOUNTER — Ambulatory Visit: Payer: Medicare Other | Admitting: Endocrinology

## 2022-08-30 DIAGNOSIS — Z23 Encounter for immunization: Secondary | ICD-10-CM | POA: Diagnosis not present

## 2022-09-01 DIAGNOSIS — H0288A Meibomian gland dysfunction right eye, upper and lower eyelids: Secondary | ICD-10-CM | POA: Diagnosis not present

## 2022-09-01 DIAGNOSIS — H0288B Meibomian gland dysfunction left eye, upper and lower eyelids: Secondary | ICD-10-CM | POA: Diagnosis not present

## 2022-09-01 DIAGNOSIS — E119 Type 2 diabetes mellitus without complications: Secondary | ICD-10-CM | POA: Diagnosis not present

## 2022-09-01 DIAGNOSIS — H2513 Age-related nuclear cataract, bilateral: Secondary | ICD-10-CM | POA: Diagnosis not present

## 2022-10-07 DIAGNOSIS — M9903 Segmental and somatic dysfunction of lumbar region: Secondary | ICD-10-CM | POA: Diagnosis not present

## 2022-10-07 DIAGNOSIS — M5413 Radiculopathy, cervicothoracic region: Secondary | ICD-10-CM | POA: Diagnosis not present

## 2022-10-07 DIAGNOSIS — M461 Sacroiliitis, not elsewhere classified: Secondary | ICD-10-CM | POA: Diagnosis not present

## 2022-10-07 DIAGNOSIS — M5451 Vertebrogenic low back pain: Secondary | ICD-10-CM | POA: Diagnosis not present

## 2022-10-07 DIAGNOSIS — M9904 Segmental and somatic dysfunction of sacral region: Secondary | ICD-10-CM | POA: Diagnosis not present

## 2022-10-07 DIAGNOSIS — M9901 Segmental and somatic dysfunction of cervical region: Secondary | ICD-10-CM | POA: Diagnosis not present

## 2022-10-08 DIAGNOSIS — M461 Sacroiliitis, not elsewhere classified: Secondary | ICD-10-CM | POA: Diagnosis not present

## 2022-10-08 DIAGNOSIS — M9904 Segmental and somatic dysfunction of sacral region: Secondary | ICD-10-CM | POA: Diagnosis not present

## 2022-10-08 DIAGNOSIS — M5451 Vertebrogenic low back pain: Secondary | ICD-10-CM | POA: Diagnosis not present

## 2022-10-08 DIAGNOSIS — M9903 Segmental and somatic dysfunction of lumbar region: Secondary | ICD-10-CM | POA: Diagnosis not present

## 2022-10-08 DIAGNOSIS — M9901 Segmental and somatic dysfunction of cervical region: Secondary | ICD-10-CM | POA: Diagnosis not present

## 2022-10-08 DIAGNOSIS — M5413 Radiculopathy, cervicothoracic region: Secondary | ICD-10-CM | POA: Diagnosis not present

## 2022-10-13 DIAGNOSIS — M9903 Segmental and somatic dysfunction of lumbar region: Secondary | ICD-10-CM | POA: Diagnosis not present

## 2022-10-13 DIAGNOSIS — M9901 Segmental and somatic dysfunction of cervical region: Secondary | ICD-10-CM | POA: Diagnosis not present

## 2022-10-13 DIAGNOSIS — M5413 Radiculopathy, cervicothoracic region: Secondary | ICD-10-CM | POA: Diagnosis not present

## 2022-10-13 DIAGNOSIS — M9904 Segmental and somatic dysfunction of sacral region: Secondary | ICD-10-CM | POA: Diagnosis not present

## 2022-10-13 DIAGNOSIS — M461 Sacroiliitis, not elsewhere classified: Secondary | ICD-10-CM | POA: Diagnosis not present

## 2022-10-13 DIAGNOSIS — M5451 Vertebrogenic low back pain: Secondary | ICD-10-CM | POA: Diagnosis not present

## 2022-10-14 DIAGNOSIS — M5413 Radiculopathy, cervicothoracic region: Secondary | ICD-10-CM | POA: Diagnosis not present

## 2022-10-14 DIAGNOSIS — M9903 Segmental and somatic dysfunction of lumbar region: Secondary | ICD-10-CM | POA: Diagnosis not present

## 2022-10-14 DIAGNOSIS — M461 Sacroiliitis, not elsewhere classified: Secondary | ICD-10-CM | POA: Diagnosis not present

## 2022-10-14 DIAGNOSIS — M9904 Segmental and somatic dysfunction of sacral region: Secondary | ICD-10-CM | POA: Diagnosis not present

## 2022-10-14 DIAGNOSIS — M5451 Vertebrogenic low back pain: Secondary | ICD-10-CM | POA: Diagnosis not present

## 2022-10-14 DIAGNOSIS — M9901 Segmental and somatic dysfunction of cervical region: Secondary | ICD-10-CM | POA: Diagnosis not present

## 2022-10-21 ENCOUNTER — Ambulatory Visit: Payer: Medicare Other | Admitting: Orthopaedic Surgery

## 2022-10-21 DIAGNOSIS — M5413 Radiculopathy, cervicothoracic region: Secondary | ICD-10-CM | POA: Diagnosis not present

## 2022-10-21 DIAGNOSIS — M9901 Segmental and somatic dysfunction of cervical region: Secondary | ICD-10-CM | POA: Diagnosis not present

## 2022-10-21 DIAGNOSIS — M461 Sacroiliitis, not elsewhere classified: Secondary | ICD-10-CM | POA: Diagnosis not present

## 2022-10-21 DIAGNOSIS — M5451 Vertebrogenic low back pain: Secondary | ICD-10-CM | POA: Diagnosis not present

## 2022-10-21 DIAGNOSIS — M9904 Segmental and somatic dysfunction of sacral region: Secondary | ICD-10-CM | POA: Diagnosis not present

## 2022-10-21 DIAGNOSIS — M9903 Segmental and somatic dysfunction of lumbar region: Secondary | ICD-10-CM | POA: Diagnosis not present

## 2022-10-24 DIAGNOSIS — E782 Mixed hyperlipidemia: Secondary | ICD-10-CM | POA: Diagnosis not present

## 2022-10-24 DIAGNOSIS — Z6827 Body mass index (BMI) 27.0-27.9, adult: Secondary | ICD-10-CM | POA: Diagnosis not present

## 2022-10-24 DIAGNOSIS — I10 Essential (primary) hypertension: Secondary | ICD-10-CM | POA: Diagnosis not present

## 2022-10-24 DIAGNOSIS — J4530 Mild persistent asthma, uncomplicated: Secondary | ICD-10-CM | POA: Diagnosis not present

## 2022-10-24 DIAGNOSIS — M19041 Primary osteoarthritis, right hand: Secondary | ICD-10-CM | POA: Diagnosis not present

## 2022-10-24 DIAGNOSIS — E118 Type 2 diabetes mellitus with unspecified complications: Secondary | ICD-10-CM | POA: Diagnosis not present

## 2022-10-24 LAB — LAB REPORT - SCANNED
A1c: 10.7
EGFR: 92

## 2022-10-27 DIAGNOSIS — H25813 Combined forms of age-related cataract, bilateral: Secondary | ICD-10-CM | POA: Diagnosis not present

## 2022-10-27 DIAGNOSIS — H02831 Dermatochalasis of right upper eyelid: Secondary | ICD-10-CM | POA: Diagnosis not present

## 2022-10-28 DIAGNOSIS — M5451 Vertebrogenic low back pain: Secondary | ICD-10-CM | POA: Diagnosis not present

## 2022-10-28 DIAGNOSIS — M5413 Radiculopathy, cervicothoracic region: Secondary | ICD-10-CM | POA: Diagnosis not present

## 2022-10-28 DIAGNOSIS — M9904 Segmental and somatic dysfunction of sacral region: Secondary | ICD-10-CM | POA: Diagnosis not present

## 2022-10-28 DIAGNOSIS — M461 Sacroiliitis, not elsewhere classified: Secondary | ICD-10-CM | POA: Diagnosis not present

## 2022-10-28 DIAGNOSIS — M9903 Segmental and somatic dysfunction of lumbar region: Secondary | ICD-10-CM | POA: Diagnosis not present

## 2022-10-28 DIAGNOSIS — M9901 Segmental and somatic dysfunction of cervical region: Secondary | ICD-10-CM | POA: Diagnosis not present

## 2022-10-30 DIAGNOSIS — M9904 Segmental and somatic dysfunction of sacral region: Secondary | ICD-10-CM | POA: Diagnosis not present

## 2022-10-30 DIAGNOSIS — M5451 Vertebrogenic low back pain: Secondary | ICD-10-CM | POA: Diagnosis not present

## 2022-10-30 DIAGNOSIS — M5413 Radiculopathy, cervicothoracic region: Secondary | ICD-10-CM | POA: Diagnosis not present

## 2022-10-30 DIAGNOSIS — M9901 Segmental and somatic dysfunction of cervical region: Secondary | ICD-10-CM | POA: Diagnosis not present

## 2022-10-30 DIAGNOSIS — M9903 Segmental and somatic dysfunction of lumbar region: Secondary | ICD-10-CM | POA: Diagnosis not present

## 2022-10-30 DIAGNOSIS — M461 Sacroiliitis, not elsewhere classified: Secondary | ICD-10-CM | POA: Diagnosis not present

## 2022-11-03 ENCOUNTER — Ambulatory Visit (INDEPENDENT_AMBULATORY_CARE_PROVIDER_SITE_OTHER): Payer: Medicare Other | Admitting: Orthopaedic Surgery

## 2022-11-03 ENCOUNTER — Ambulatory Visit (INDEPENDENT_AMBULATORY_CARE_PROVIDER_SITE_OTHER): Payer: Medicare Other

## 2022-11-03 ENCOUNTER — Encounter: Payer: Self-pay | Admitting: Orthopaedic Surgery

## 2022-11-03 DIAGNOSIS — Z96652 Presence of left artificial knee joint: Secondary | ICD-10-CM

## 2022-11-03 NOTE — Progress Notes (Signed)
The patient is someone who is over a year out from a left knee revision arthroplasty in terms of changing the polyethylene liner and the patella component.  The original knee was done about 15 years ago was a press-fit implant.  We found no evidence intraoperative of prosthetic loosening.  She still has chronic pain with that knee but does not want anything else done with the knee.  On exam there is only minimal swelling of the left knee with excellent range of motion.  The knee feels ligamentously stable.  2 views of the left knee show well-seated total knee arthroplasty with no complicating features.  At this point there is really nothing else I would recommend for her left knee and she does not want a thing done for it as well.  Of note her right knee is doing better after a hyaluronic acid injection.  That knee still is well-maintained joint space.  Follow-up is as needed.  If things worsen at all the only other thing I would recommend it is an excision arthroplasty of all components with revision knee.

## 2022-11-04 DIAGNOSIS — M5413 Radiculopathy, cervicothoracic region: Secondary | ICD-10-CM | POA: Diagnosis not present

## 2022-11-04 DIAGNOSIS — M9903 Segmental and somatic dysfunction of lumbar region: Secondary | ICD-10-CM | POA: Diagnosis not present

## 2022-11-04 DIAGNOSIS — M5451 Vertebrogenic low back pain: Secondary | ICD-10-CM | POA: Diagnosis not present

## 2022-11-04 DIAGNOSIS — M9904 Segmental and somatic dysfunction of sacral region: Secondary | ICD-10-CM | POA: Diagnosis not present

## 2022-11-04 DIAGNOSIS — M9901 Segmental and somatic dysfunction of cervical region: Secondary | ICD-10-CM | POA: Diagnosis not present

## 2022-11-04 DIAGNOSIS — M461 Sacroiliitis, not elsewhere classified: Secondary | ICD-10-CM | POA: Diagnosis not present

## 2022-11-05 DIAGNOSIS — E118 Type 2 diabetes mellitus with unspecified complications: Secondary | ICD-10-CM | POA: Diagnosis not present

## 2022-11-05 DIAGNOSIS — Z6827 Body mass index (BMI) 27.0-27.9, adult: Secondary | ICD-10-CM | POA: Diagnosis not present

## 2022-11-06 DIAGNOSIS — M9901 Segmental and somatic dysfunction of cervical region: Secondary | ICD-10-CM | POA: Diagnosis not present

## 2022-11-06 DIAGNOSIS — M461 Sacroiliitis, not elsewhere classified: Secondary | ICD-10-CM | POA: Diagnosis not present

## 2022-11-06 DIAGNOSIS — M9904 Segmental and somatic dysfunction of sacral region: Secondary | ICD-10-CM | POA: Diagnosis not present

## 2022-11-06 DIAGNOSIS — M9903 Segmental and somatic dysfunction of lumbar region: Secondary | ICD-10-CM | POA: Diagnosis not present

## 2022-11-06 DIAGNOSIS — M5451 Vertebrogenic low back pain: Secondary | ICD-10-CM | POA: Diagnosis not present

## 2022-11-06 DIAGNOSIS — M5413 Radiculopathy, cervicothoracic region: Secondary | ICD-10-CM | POA: Diagnosis not present

## 2022-11-13 ENCOUNTER — Telehealth: Payer: Self-pay | Admitting: Physician Assistant

## 2022-11-13 DIAGNOSIS — M5413 Radiculopathy, cervicothoracic region: Secondary | ICD-10-CM | POA: Diagnosis not present

## 2022-11-13 DIAGNOSIS — M9901 Segmental and somatic dysfunction of cervical region: Secondary | ICD-10-CM | POA: Diagnosis not present

## 2022-11-13 DIAGNOSIS — M461 Sacroiliitis, not elsewhere classified: Secondary | ICD-10-CM | POA: Diagnosis not present

## 2022-11-13 DIAGNOSIS — M9903 Segmental and somatic dysfunction of lumbar region: Secondary | ICD-10-CM | POA: Diagnosis not present

## 2022-11-13 DIAGNOSIS — M5451 Vertebrogenic low back pain: Secondary | ICD-10-CM | POA: Diagnosis not present

## 2022-11-13 DIAGNOSIS — M9904 Segmental and somatic dysfunction of sacral region: Secondary | ICD-10-CM | POA: Diagnosis not present

## 2022-11-13 NOTE — Telephone Encounter (Signed)
Pt called requesting a refill of ondansetron. Please send to pharmacy on file. Pt phone number is [phone]

## 2022-11-14 NOTE — Telephone Encounter (Signed)
Called and advised pt.

## 2022-12-12 ENCOUNTER — Other Ambulatory Visit: Payer: Self-pay | Admitting: Family Medicine

## 2022-12-12 DIAGNOSIS — Z1231 Encounter for screening mammogram for malignant neoplasm of breast: Secondary | ICD-10-CM

## 2022-12-30 ENCOUNTER — Telehealth: Payer: Self-pay | Admitting: Orthopaedic Surgery

## 2022-12-30 NOTE — Telephone Encounter (Signed)
Talked with patient and advised her that appointment needs to be after 02/16/2023 due to last gel injection being done on 08/18/2022. Patient voiced that she understands. Appt. R/S

## 2022-12-30 NOTE — Telephone Encounter (Signed)
Patient wants to get another gel shot for her knee. Appointment made for this month

## 2023-01-05 DIAGNOSIS — E118 Type 2 diabetes mellitus with unspecified complications: Secondary | ICD-10-CM | POA: Diagnosis not present

## 2023-01-05 LAB — LAB REPORT - SCANNED
A1c: 7.6
EGFR: 84

## 2023-01-16 ENCOUNTER — Ambulatory Visit
Admission: RE | Admit: 2023-01-16 | Discharge: 2023-01-16 | Disposition: A | Discharge status: Home / Self Care | Payer: Medicare Other | Source: Ambulatory Visit | Attending: Family Medicine | Admitting: Family Medicine

## 2023-01-16 DIAGNOSIS — Z1231 Encounter for screening mammogram for malignant neoplasm of breast: Secondary | ICD-10-CM | POA: Diagnosis not present

## 2023-01-19 ENCOUNTER — Ambulatory Visit: Payer: Medicare Other | Admitting: Orthopaedic Surgery

## 2023-01-26 DIAGNOSIS — E1169 Type 2 diabetes mellitus with other specified complication: Secondary | ICD-10-CM | POA: Diagnosis not present

## 2023-01-26 DIAGNOSIS — Z6827 Body mass index (BMI) 27.0-27.9, adult: Secondary | ICD-10-CM | POA: Diagnosis not present

## 2023-02-12 ENCOUNTER — Telehealth: Payer: Self-pay

## 2023-02-12 ENCOUNTER — Other Ambulatory Visit: Payer: Self-pay

## 2023-02-12 DIAGNOSIS — M1711 Unilateral primary osteoarthritis, right knee: Secondary | ICD-10-CM

## 2023-02-12 NOTE — Telephone Encounter (Signed)
VOB submitted for Monovisc, right knee  

## 2023-02-17 ENCOUNTER — Telehealth: Payer: Self-pay | Admitting: Orthopaedic Surgery

## 2023-02-17 ENCOUNTER — Ambulatory Visit: Payer: Medicare Other | Admitting: Orthopaedic Surgery

## 2023-02-17 NOTE — Telephone Encounter (Signed)
Patient cannot get to appt today for Gel injection due [place] she would like a call back to schedule injection next avail is not until 04/17 please advise if she can be worked in or if she will have to wait

## 2023-02-24 ENCOUNTER — Encounter: Payer: Self-pay | Admitting: Podiatry

## 2023-02-24 ENCOUNTER — Ambulatory Visit (INDEPENDENT_AMBULATORY_CARE_PROVIDER_SITE_OTHER): Payer: Medicare Other | Admitting: Podiatry

## 2023-02-24 VITALS — BP 131/69 | HR 68

## 2023-02-24 DIAGNOSIS — M2041 Other hammer toe(s) (acquired), right foot: Secondary | ICD-10-CM

## 2023-02-24 DIAGNOSIS — M2042 Other hammer toe(s) (acquired), left foot: Secondary | ICD-10-CM | POA: Diagnosis not present

## 2023-02-24 MED ORDER — MELOXICAM 15 MG PO TABS: 15.0000 mg | ORAL_TABLET | Freq: Every day | ORAL | 2 refills | Status: DC

## 2023-02-24 NOTE — Progress Notes (Signed)
   Chief Complaint  Patient presents with   Foot Pain    "My feet seems to be getting worse.  The second and third hammertoes they hurt.  I get sharp pains across the top.  They swell a lot.  I can't wear socks for very long.  They make my feet and toes hurt.  I'm not sure if the Meloxicam is working."    Subjective: 72 year old female with PMHx of DM presenting today for follow up evaluation of bilateral foot and ankle pain.  Patient states that the meloxicam seems to help temporarily.  Past Medical History:  Diagnosis Date   Anemia    hx- high school   Asthma    Complication of anesthesia    problem being combative awakening from  hysterectomy   4 but had total knee 08 and did fine   COPD (chronic obstructive pulmonary disease)    Diabetes mellitus    Dyspnea    GERD (gastroesophageal reflux disease)    occ   Headache(784.0)    migraines hx - couple/month   Hyperlipidemia    Hypertension    Osteoarthritis    fingers, hands   Osteopenia    PONV (postoperative nausea and vomiting)      Objective: Physical Exam General: The patient is alert and oriented x3 in no acute distress.  Dermatology: Skin is warm, dry and supple bilateral lower extremities. Negative for open lesions or macerations bilateral.   Vascular: Dorsalis Pedis and Posterior Tibial pulses palpable bilateral.  Capillary fill time is immediate to all digits.  Neurological: Epicritic and protective threshold intact bilateral.   Musculoskeletal: There continues to be some moderate tenderness to palpation to the plantar aspect of the bilateral heels along the plantar fascia; lipomas also noted to the anterior lateral aspect of the bilateral ankles.  Hammertoe contracture deformity noted at the DIPJ of the second and third digits bilateral since the second and third toes seem to extend beyond the normal parabola of the foot likely rubbing/jamming in her shoes   Assessment: 1. plantar fasciitis bilateral  feet 2.  Postmenopausal lipomas bilateral ankles 3.  Hammertoe second and third digits contracted at the DIPJ bilateral   Plan of Care:  1. Patient evaluated. 2. Refill prescription for Meloxicam provided to patient.  3. Declined injection today.  Patient states that she is allergic to cortisone injections 4.  Continue wearing good supportive shoes and sandals advise against going barefoot 5.  Recommend topical Voltaren as needed 6.  Return to clinic as needed  Edrick Kins, DPM [facility]  Dr. Edrick Kins, DPM    [address], [place] [address]                Office [phone]  Fax [phone]

## 2023-03-11 ENCOUNTER — Ambulatory Visit (INDEPENDENT_AMBULATORY_CARE_PROVIDER_SITE_OTHER): Payer: Medicare Other | Admitting: Orthopaedic Surgery

## 2023-03-11 ENCOUNTER — Encounter: Payer: Self-pay | Admitting: Orthopaedic Surgery

## 2023-03-11 DIAGNOSIS — M1711 Unilateral primary osteoarthritis, right knee: Secondary | ICD-10-CM

## 2023-03-11 MED ORDER — HYALURONAN 88 MG/4ML IX SOSY
88.0000 mg | PREFILLED_SYRINGE | INTRA_ARTICULAR | Status: AC | PRN
Start: 2023-03-11 — End: 2023-03-11
  Administered 2023-03-11: 88 mg via INTRA_ARTICULAR

## 2023-03-11 MED ORDER — LIDOCAINE HCL 1 % IJ SOLN
3.0000 mL | INTRAMUSCULAR | Status: AC | PRN
Start: 2023-03-11 — End: 2023-03-11
  Administered 2023-03-11: 3 mL

## 2023-03-11 NOTE — Progress Notes (Signed)
   Procedure Note  Patient: Patricia Travis             Date of Birth: 1951/03/04           MRN: [id number]             Visit Date: 03/11/2023 HPI: Mrs. Moder comes in today for scheduled Monovisc injection right knee.  She is unable to tolerate cortisone injections due to significant burning after injections.  She has known osteoarthritis of the right knee.  She denies any new injury.  States that her knee pain is 9 out of 10 pain at worst mostly time is 6-7 out of 10 pain.  She has no upcoming surgery for the right knee in the next 6 months.  Physical exam: Right knee full range of motion.  Tenderness along medial joint line.  No instability valgus varus stressing.  No abnormal warmth erythema or effusion.   Procedures: Visit Diagnoses:  1. Unilateral primary osteoarthritis, right knee     Large Joint Inj: R knee on 03/11/2023 9:37 AM Indications: pain Details: 22 G 1.5 in needle, anterolateral approach  Arthrogram: No  Medications: 88 mg Hyaluronan 88 MG/4ML; 3 mL lidocaine 1 % Outcome: tolerated well, no immediate complications Procedure, treatment alternatives, risks and benefits explained, specific risks discussed. Consent was given by the patient. Immediately prior to procedure a time out was called to verify the correct patient, procedure, equipment, support staff and site/side marked as required. Patient was prepped and draped in the usual sterile fashion.      Plan: She understands to wait least 6 months between viscosupplementation injections.  Follow-up with [place] as needed.  Questions encouraged and answered

## 2023-04-13 DIAGNOSIS — E782 Mixed hyperlipidemia: Secondary | ICD-10-CM | POA: Diagnosis not present

## 2023-04-13 DIAGNOSIS — I10 Essential (primary) hypertension: Secondary | ICD-10-CM | POA: Diagnosis not present

## 2023-04-13 DIAGNOSIS — E118 Type 2 diabetes mellitus with unspecified complications: Secondary | ICD-10-CM | POA: Diagnosis not present

## 2023-04-13 LAB — LAB REPORT - SCANNED
A1c: 7
EGFR: 65

## 2023-04-28 DIAGNOSIS — E118 Type 2 diabetes mellitus with unspecified complications: Secondary | ICD-10-CM | POA: Diagnosis not present

## 2023-04-28 DIAGNOSIS — Z6827 Body mass index (BMI) 27.0-27.9, adult: Secondary | ICD-10-CM | POA: Diagnosis not present

## 2023-07-15 DIAGNOSIS — E1169 Type 2 diabetes mellitus with other specified complication: Secondary | ICD-10-CM | POA: Diagnosis not present

## 2023-07-15 DIAGNOSIS — E782 Mixed hyperlipidemia: Secondary | ICD-10-CM | POA: Diagnosis not present

## 2023-07-15 DIAGNOSIS — I10 Essential (primary) hypertension: Secondary | ICD-10-CM | POA: Diagnosis not present

## 2023-07-23 DIAGNOSIS — M5451 Vertebrogenic low back pain: Secondary | ICD-10-CM | POA: Diagnosis not present

## 2023-07-23 DIAGNOSIS — M5413 Radiculopathy, cervicothoracic region: Secondary | ICD-10-CM | POA: Diagnosis not present

## 2023-07-23 DIAGNOSIS — M9904 Segmental and somatic dysfunction of sacral region: Secondary | ICD-10-CM | POA: Diagnosis not present

## 2023-07-23 DIAGNOSIS — M461 Sacroiliitis, not elsewhere classified: Secondary | ICD-10-CM | POA: Diagnosis not present

## 2023-07-23 DIAGNOSIS — M9903 Segmental and somatic dysfunction of lumbar region: Secondary | ICD-10-CM | POA: Diagnosis not present

## 2023-07-23 DIAGNOSIS — M9901 Segmental and somatic dysfunction of cervical region: Secondary | ICD-10-CM | POA: Diagnosis not present

## 2023-08-24 ENCOUNTER — Telehealth: Payer: Self-pay | Admitting: Orthopaedic Surgery

## 2023-08-24 NOTE — Telephone Encounter (Signed)
Pt would like to get another Monovisc injection in her right knee pt had last gel injection 03/11/23

## 2023-08-25 NOTE — Telephone Encounter (Signed)
VOB submitted for Monovisc, right knee  

## 2023-08-25 NOTE — Telephone Encounter (Signed)
Next appt.would need to be after 09/10/2023 for gel injection.

## 2023-09-03 DIAGNOSIS — E119 Type 2 diabetes mellitus without complications: Secondary | ICD-10-CM | POA: Diagnosis not present

## 2023-09-03 DIAGNOSIS — H43812 Vitreous degeneration, left eye: Secondary | ICD-10-CM | POA: Diagnosis not present

## 2023-09-03 DIAGNOSIS — H0288A Meibomian gland dysfunction right eye, upper and lower eyelids: Secondary | ICD-10-CM | POA: Diagnosis not present

## 2023-09-03 DIAGNOSIS — H25813 Combined forms of age-related cataract, bilateral: Secondary | ICD-10-CM | POA: Diagnosis not present

## 2023-09-03 DIAGNOSIS — H0288B Meibomian gland dysfunction left eye, upper and lower eyelids: Secondary | ICD-10-CM | POA: Diagnosis not present

## 2023-09-03 LAB — HM DIABETES EYE EXAM

## 2023-09-14 DIAGNOSIS — J301 Allergic rhinitis due to pollen: Secondary | ICD-10-CM | POA: Diagnosis not present

## 2023-09-14 DIAGNOSIS — H9313 Tinnitus, bilateral: Secondary | ICD-10-CM | POA: Diagnosis not present

## 2023-09-14 DIAGNOSIS — H6983 Other specified disorders of Eustachian tube, bilateral: Secondary | ICD-10-CM | POA: Diagnosis not present

## 2023-09-23 ENCOUNTER — Ambulatory Visit (INDEPENDENT_AMBULATORY_CARE_PROVIDER_SITE_OTHER): Payer: Medicare Other | Admitting: Orthopaedic Surgery

## 2023-09-23 ENCOUNTER — Encounter: Payer: Self-pay | Admitting: Orthopaedic Surgery

## 2023-09-23 ENCOUNTER — Other Ambulatory Visit: Payer: Self-pay

## 2023-09-23 DIAGNOSIS — M1711 Unilateral primary osteoarthritis, right knee: Secondary | ICD-10-CM

## 2023-09-23 MED ORDER — HYALURONAN 88 MG/4ML IX SOSY
88.0000 mg | PREFILLED_SYRINGE | INTRA_ARTICULAR | Status: AC | PRN
Start: 2023-09-23 — End: 2023-09-23
  Administered 2023-09-23: 88 mg via INTRA_ARTICULAR

## 2023-09-23 NOTE — Progress Notes (Signed)
   Procedure Note  Patient: Patricia Travis             Date of Birth: 08/22/1951           MRN: [id number]             Visit Date: 09/23/2023  Procedures: Visit Diagnoses:  1. Unilateral primary osteoarthritis, right knee     Large Joint Inj: R knee on 09/23/2023 12:53 PM Indications: diagnostic evaluation and pain Details: 22 G 1.5 in needle, superolateral approach  Arthrogram: No  Medications: 88 mg Hyaluronan 88 MG/4ML Outcome: tolerated well, no immediate complications Procedure, treatment alternatives, risks and benefits explained, specific risks discussed. Consent was given by the patient. Immediately prior to procedure a time out was called to verify the correct patient, procedure, equipment, support staff and site/side marked as required. Patient was prepped and draped in the usual sterile fashion.     The patient is here today for hyaluronic acid injection in her right knee with Monovisc to treat the pain from osteoarthritis.  She has had this type of injection before and it lasted for about 6 months.  She has known and well-documented osteoarthritis in her right knee.  We replaced her left knee over a year ago.  Her right knee shows no effusion today.  I did place Monovisc in her right knee today which she tolerated well.  She knows follow-up can be as needed and we can always repeat this again in 6 months if needed.  She knows that we will need to see her in the office before we can order this again.  Lot number: [id number]

## 2023-10-28 ENCOUNTER — Telehealth: Payer: Self-pay | Admitting: Orthopaedic Surgery

## 2023-10-28 ENCOUNTER — Other Ambulatory Visit: Payer: Self-pay | Admitting: Orthopaedic Surgery

## 2023-10-28 MED ORDER — TRAMADOL HCL 50 MG PO TABS: 50.0000 mg | ORAL_TABLET | Freq: Two times a day (BID) | ORAL | 0 refills | Status: DC | PRN

## 2023-10-28 NOTE — Telephone Encounter (Signed)
Pt would like refill on Tramadol 50 MG because her PCP left the practice and cannot fill it and the only other dr she has cannot refill it because it is a narcotic send to [facility] in [place].

## 2023-10-28 NOTE — Telephone Encounter (Signed)
Called patient. She is aware this would be only for a limited time. Her new PCP office opens in January & will be taking back over.

## 2023-12-01 ENCOUNTER — Encounter: Payer: Self-pay | Admitting: Family Medicine

## 2023-12-01 ENCOUNTER — Ambulatory Visit: Payer: Medicare Other | Admitting: Family Medicine

## 2023-12-01 ENCOUNTER — Other Ambulatory Visit: Payer: Self-pay | Admitting: Family Medicine

## 2023-12-01 VITALS — BP 131/75 | HR 76 | Temp 97.5°F | Resp 16 | Ht 65.5 in | Wt 168.2 lb

## 2023-12-01 DIAGNOSIS — K219 Gastro-esophageal reflux disease without esophagitis: Secondary | ICD-10-CM

## 2023-12-01 DIAGNOSIS — E119 Type 2 diabetes mellitus without complications: Secondary | ICD-10-CM

## 2023-12-01 DIAGNOSIS — M810 Age-related osteoporosis without current pathological fracture: Secondary | ICD-10-CM | POA: Insufficient documentation

## 2023-12-01 DIAGNOSIS — I10 Essential (primary) hypertension: Secondary | ICD-10-CM

## 2023-12-01 DIAGNOSIS — Z1211 Encounter for screening for malignant neoplasm of colon: Secondary | ICD-10-CM | POA: Insufficient documentation

## 2023-12-01 DIAGNOSIS — M1711 Unilateral primary osteoarthritis, right knee: Secondary | ICD-10-CM

## 2023-12-01 DIAGNOSIS — R110 Nausea: Secondary | ICD-10-CM | POA: Insufficient documentation

## 2023-12-01 DIAGNOSIS — Z13820 Encounter for screening for osteoporosis: Secondary | ICD-10-CM

## 2023-12-01 MED ORDER — ONDANSETRON 4 MG PO TBDP: 4.0000 mg | ORAL_TABLET | Freq: Three times a day (TID) | ORAL | 2 refills | Status: DC | PRN

## 2023-12-01 MED ORDER — TRAMADOL HCL 50 MG PO TABS: 50.0000 mg | ORAL_TABLET | Freq: Two times a day (BID) | ORAL | 0 refills | Status: DC | PRN

## 2023-12-01 NOTE — Assessment & Plan Note (Signed)
 Refilled her tramadol for her knee pain.

## 2023-12-01 NOTE — Assessment & Plan Note (Signed)
 Needs a screening bone mineral density scan.

## 2023-12-01 NOTE — Progress Notes (Signed)
 Established Patient Office Visit  Subjective   Patient ID: Patricia Travis, female    DOB: 12-12-1950  Age: 73 y.o. MRN: [id number]  Chief Complaint  Patient presents with   Medical Management of Chronic Issues    HPI she reports she is doing really well.  Has chronic pain in both knees.  She is status post total knee replacement left and has advanced osteoarthritis in the right knee.  She takes meloxicam  15 mg daily for her foot and knee pains.  She is just got gel shots in her right knee and it seems to be helping. Gets her mammogram in February Discussed colon cancer screening and she declined Needs a flu and COVID-vaccine encouraged her to go to the pharmacy for that.  Recently saw Dr. Blair for ENT no problems followed by Dr. Janit for podiatry. Takes tramadol  for knee pain and lower back pain.  Needs refill of tramadol  today.     ROS    Objective:     BP 131/75 (BP Location: Right Arm, Patient Position: Sitting, Cuff Size: Normal)   Pulse 76   Temp (!) 97.5 F (36.4 C) (Oral)   Resp 16   Ht 5' 5.5 (1.664 m) Comment: per patient  Wt 168 lb 3.2 oz (76.3 kg)   SpO2 96%   BMI 27.56 kg/m    Physical Exam Vitals and nursing note reviewed.  Constitutional:      Appearance: Normal appearance.  HENT:     Head: Normocephalic and atraumatic.  Eyes:     Conjunctiva/sclera: Conjunctivae normal.  Cardiovascular:     Rate and Rhythm: Normal rate and regular rhythm.     Pulses:          Dorsalis pedis pulses are 2+ on the right side and 2+ on the left side.       Posterior tibial pulses are 2+ on the right side and 2+ on the left side.  Pulmonary:     Effort: Pulmonary effort is normal.     Breath sounds: Normal breath sounds.  Musculoskeletal:     Right lower leg: No edema.     Left lower leg: No edema.  Feet:     Right foot:     Skin integrity: Dry skin present.     Toenail Condition: Right toenails are normal.     Left foot:     Skin integrity: Dry skin  present.     Toenail Condition: Left toenails are normal.     Comments: Able to sense monofilament in 4 locations bilateral feet without difficulty.  Hammertoes noted second toe bilaterally.  No calluses ulcer formation or injuries Skin:    General: Skin is warm and dry.  Neurological:     Mental Status: She is alert and oriented to person, place, and time.  Psychiatric:        Mood and Affect: Mood normal.        Behavior: Behavior normal.        Thought Content: Thought content normal.        Judgment: Judgment normal.          No results found for any visits on 12/01/23.    The ASCVD Risk score (Arnett DK, et al., 2019) failed to calculate for the following reasons:   Cannot find a previous HDL lab   Cannot find a previous total cholesterol lab    Assessment & Plan:  Type 2 diabetes mellitus without complication, without long-term current use of  insulin  (HCC) -     Ambulatory referral to Ophthalmology -     HM Diabetes Foot Exam -     Hemoglobin A1c -     Lipid panel -     Comprehensive metabolic panel  Osteoporosis screening -     DG Bone Density; Future  Colon cancer screening Assessment & Plan: Discussed colon cancer screening with her today and she declined.   Essential hypertension -     Hemoglobin A1c -     Lipid panel -     Comprehensive metabolic panel  Gastroesophageal reflux disease without esophagitis -     Hemoglobin A1c -     Lipid panel -     Comprehensive metabolic panel  Osteoporosis, postmenopausal -     DG Bone Density; Future  Postmenopausal osteoporosis Assessment & Plan: Needs a screening bone mineral density scan.   Other orders -     Ondansetron ; Take 1 tablet (4 mg total) by mouth every 8 (eight) hours as needed for nausea or vomiting.  Dispense: 20 tablet; Refill: 2 -     traMADol  HCl; Take 1-2 tablets (50-100 mg total) by mouth every 12 (twelve) hours as needed for moderate pain (pain score 4-6).  Dispense: 90 tablet; Refill:  0     Return in about 3 months (around 02/29/2024), or Recheck DMT2, for A1c, BP recheck, chronic followup.    Petro Talent K Lacara Dunsworth, MD

## 2023-12-01 NOTE — Assessment & Plan Note (Signed)
 Blood pressure marginal control.  Start amlodipine 5 mg daily.  Add this to her current regimen of losartan 25 mg

## 2023-12-01 NOTE — Progress Notes (Signed)
 cmp

## 2023-12-01 NOTE — Assessment & Plan Note (Signed)
 Discussed colon cancer screening with her today and she declined.

## 2023-12-01 NOTE — Assessment & Plan Note (Signed)
 Takes ondansetron 4 mg as needed.  Refilled this prescription

## 2023-12-02 LAB — CBC WITH DIFFERENTIAL/PLATELET
Basophils Absolute: 0.1 10*3/uL (ref 0.0–0.2)
Basos: 1 %
EOS (ABSOLUTE): 0.2 10*3/uL (ref 0.0–0.4)
Eos: 2 %
Hematocrit: 38.6 % (ref 34.0–46.6)
Hemoglobin: 12.2 g/dL (ref 11.1–15.9)
Immature Grans (Abs): 0 10*3/uL (ref 0.0–0.1)
Immature Granulocytes: 0 %
Lymphocytes Absolute: 2.2 10*3/uL (ref 0.7–3.1)
Lymphs: 24 %
MCH: 30 pg (ref 26.6–33.0)
MCHC: 31.6 g/dL (ref 31.5–35.7)
MCV: 95 fL (ref 79–97)
Monocytes Absolute: 0.4 10*3/uL (ref 0.1–0.9)
Monocytes: 5 %
Neutrophils Absolute: 6.2 10*3/uL (ref 1.4–7.0)
Neutrophils: 68 %
Platelets: 236 10*3/uL (ref 150–450)
RBC: 4.06 x10E6/uL (ref 3.77–5.28)
RDW: 12.8 % (ref 11.7–15.4)
WBC: 9 10*3/uL (ref 3.4–10.8)

## 2023-12-02 LAB — COMPREHENSIVE METABOLIC PANEL
ALT: 14 [IU]/L (ref 0–32)
AST: 17 [IU]/L (ref 0–40)
Albumin: 4.5 g/dL (ref 3.8–4.8)
Alkaline Phosphatase: 68 [IU]/L (ref 44–121)
BUN/Creatinine Ratio: 21 (ref 12–28)
BUN: 16 mg/dL (ref 8–27)
Bilirubin Total: 0.3 mg/dL (ref 0.0–1.2)
CO2: 24 mmol/L (ref 20–29)
Calcium: 9.4 mg/dL (ref 8.7–10.3)
Chloride: 104 mmol/L (ref 96–106)
Creatinine, Ser: 0.76 mg/dL (ref 0.57–1.00)
Globulin, Total: 2.2 g/dL (ref 1.5–4.5)
Glucose: 147 mg/dL — ABNORMAL HIGH (ref 70–99)
Potassium: 5.6 mmol/L — ABNORMAL HIGH (ref 3.5–5.2)
Sodium: 142 mmol/L (ref 134–144)
Total Protein: 6.7 g/dL (ref 6.0–8.5)
eGFR: 83 mL/min/{1.73_m2} (ref >59)

## 2023-12-02 LAB — LIPID PANEL
Chol/HDL Ratio: 2.7 {ratio} (ref 0.0–4.4)
Cholesterol, Total: 142 mg/dL (ref 100–199)
HDL: 52 mg/dL (ref >39)
LDL Chol Calc (NIH): 61 mg/dL (ref 0–99)
Triglycerides: 176 mg/dL — ABNORMAL HIGH (ref 0–149)
VLDL Cholesterol Cal: 29 mg/dL (ref 5–40)

## 2023-12-02 LAB — HEMOGLOBIN A1C
Est. average glucose Bld gHb Est-mCnc: 160 mg/dL
Hgb A1c MFr Bld: 7.2 % — ABNORMAL HIGH (ref 4.8–5.6)

## 2023-12-03 ENCOUNTER — Telehealth: Payer: Self-pay

## 2023-12-03 NOTE — Telephone Encounter (Signed)
-----   Message from Devere MARLA Mock sent at 12/03/2023 11:15 AM EST ----- Camelia,   Your diabetes is getting better!  A1c is down to 7.2%.  Let's increase your glipizide  to 10 mg a day.  You are currently taking 5 mg a day.   Your potassium was mildly elevated.  We will watch your potassium.  We may have to adjust your Losartan  if your potassium remains elevated.   You have an excellent cholesterol panel.   Your cell counts are normal.   Best regards

## 2023-12-03 NOTE — Telephone Encounter (Signed)
 Attempt to reach patient to review/discuss lab results. Awaiting return call, will also attempt to contact again.

## 2023-12-03 NOTE — Telephone Encounter (Signed)
 Patient called to review lab results/reccomendations.  Patient verbalized understanding and will await urine culture results/antibiotic recommendation. No questions or concerns were expressed at the call's conclusion.

## 2023-12-10 ENCOUNTER — Other Ambulatory Visit: Payer: Self-pay | Admitting: Family Medicine

## 2023-12-10 DIAGNOSIS — Z1231 Encounter for screening mammogram for malignant neoplasm of breast: Secondary | ICD-10-CM

## 2023-12-16 ENCOUNTER — Other Ambulatory Visit: Payer: Self-pay | Admitting: Family Medicine

## 2023-12-16 MED ORDER — SIMVASTATIN 40 MG PO TABS: 40.0000 mg | ORAL_TABLET | Freq: Every evening | ORAL | 5 refills | Status: DC

## 2023-12-16 NOTE — Telephone Encounter (Signed)
Copied from CRM [id number]. Topic: Clinical - Medication Refill >> Dec 16, 2023  4:00 PM Shelah Lewandowsky wrote: Most Recent Primary Care Visit:   Medication: simvastatin (ZOCOR) 40 MG tablet  Has the patient contacted their pharmacy? No (Agent: If no, request that the patient contact the pharmacy for the refill. If patient does not wish to contact the pharmacy document the reason why and proceed with request.) (Agent: If yes, when and what did the pharmacy advise?)  Is this the correct pharmacy for this prescription? Yes If no, delete pharmacy and type the correct one.  This is the patient's preferred pharmacy:  [id number] [place] [place] - [address] [place] [address] Phone: [phone] Fax: [phone]     Has the prescription been filled recently? No  Is the patient out of the medication? No  Has the patient been seen for an appointment in the last year OR does the patient have an upcoming appointment? Yes  Can we respond through MyChart? No  Agent: Please be advised that Rx refills may take up to 3 business days. We ask that you follow-up with your pharmacy.

## 2023-12-16 NOTE — Telephone Encounter (Signed)
Completed.

## 2023-12-21 ENCOUNTER — Telehealth: Payer: Self-pay | Admitting: Family Medicine

## 2023-12-21 NOTE — Telephone Encounter (Signed)
Spoke with patient and will await records for abstraction.

## 2023-12-21 NOTE — Telephone Encounter (Signed)
Copied from CRM [id number]. Topic: Referral - Status >> Dec 18, 2023 11:13 AM Fuller Mandril wrote: Reason for CRM: Vikki Ports with [facility] called about referral received. States contacted patient. Patient stated she already had her diabetic eye exam. Thank You  Reason for CRM: Vikki Ports with [facility] called about referral received. States contacted patient. Patient stated she already had her diabetic eye exam. Thank You

## 2024-01-11 ENCOUNTER — Other Ambulatory Visit: Payer: Self-pay | Admitting: Family Medicine

## 2024-01-11 NOTE — Telephone Encounter (Signed)
Copied from CRM [id number]. Topic: Clinical - Medication Refill >> Jan 11, 2024  9:38 AM Alphonzo Lemmings O wrote: Most Recent Primary Care Visit:   Medication: RYBELSUS 14 MG TABS losartan (COZAAR) 25 MG tablet metFORMIN (GLUCOPHAGE-XR) 500 MG 24 hr tablet    Has the patient contacted their pharmacy? No they are out of refills (Agent: If no, request that the patient contact the pharmacy for the refill. If patient does not wish to contact the pharmacy document the reason why and proceed with request.) (Agent: If yes, when and what did the pharmacy advise?)  Is this the correct pharmacy for this prescription? Yes If no, delete pharmacy and type the correct one.  This is the patient's preferred pharmacy:  [id number] [place], [place] - [address] [place] [address] Phone: [phone] Fax: [phone]  [facility] - [place], [place] - [address] [place] [address] Phone: [phone] Fax: [phone]   Has the prescription been filled recently?   Is the patient out of the medication? No  Has the patient been seen for an appointment in the last year OR does the patient have an upcoming appointment? Yes  Can we respond through MyChart? No  Agent: Please be advised that Rx refills may take up to 3 business days. We ask that you follow-up with your pharmacy.

## 2024-01-11 NOTE — Telephone Encounter (Signed)
Copied from CRM [id number]. Topic: Clinical - Medication Refill >> Jan 11, 2024  9:38 AM Alphonzo Lemmings O wrote: Most Recent Primary Care Visit:   Medication: RYBELSUS 14 MG TABS losartan (COZAAR) 25 MG tablet metFORMIN (GLUCOPHAGE-XR) 500 MG 24 hr tablet    Has the patient contacted their pharmacy? No they are out of refills (Agent: If no, request that the patient contact the pharmacy for the refill. If patient does not wish to contact the pharmacy document the reason why and proceed with request.) (Agent: If yes, when and what did the pharmacy advise?)  Is this the correct pharmacy for this prescription? Yes If no, delete pharmacy and type the correct one.  This is the patient's preferred pharmacy:  [id number] [place], [place] - [address] [place] [address] Phone: [phone] Fax: [phone]  [facility] - [place], [place] - [address] [place] [address] Phone: [phone] Fax: [phone]   Has the prescription been filled recently? No  Is the patient out of the medication? No  Has the patient been seen for an appointment in the last year OR does the patient have an upcoming appointment? Yes  Can we respond through MyChart? No  Agent: Please be advised that Rx refills may take up to 3 business days. We ask that you follow-up with your pharmacy.

## 2024-01-18 ENCOUNTER — Telehealth: Payer: Self-pay | Admitting: Family Medicine

## 2024-01-18 ENCOUNTER — Other Ambulatory Visit: Payer: Self-pay

## 2024-01-18 ENCOUNTER — Ambulatory Visit
Admission: RE | Admit: 2024-01-18 | Discharge: 2024-01-18 | Disposition: A | Discharge status: Home / Self Care | Payer: Medicare Other | Source: Ambulatory Visit | Attending: Family Medicine | Admitting: Family Medicine

## 2024-01-18 DIAGNOSIS — Z1231 Encounter for screening mammogram for malignant neoplasm of breast: Secondary | ICD-10-CM | POA: Insufficient documentation

## 2024-01-18 MED ORDER — METFORMIN HCL ER 500 MG PO TB24: 2000.0000 mg | ORAL_TABLET | Freq: Every day | ORAL | 3 refills | Status: DC

## 2024-01-18 MED ORDER — LOSARTAN POTASSIUM 25 MG PO TABS: 25.0000 mg | ORAL_TABLET | Freq: Every day | ORAL | 0 refills | Status: DC

## 2024-01-18 NOTE — Telephone Encounter (Signed)
 Copied from CRM [id number]. Topic: Appointments - Scheduling Inquiry for Clinic >> Jan 18, 2024  3:42 PM Fonda Kinder J wrote: Reason for CRM: Pt has to schedule a visit for prescription refill but her next appointment isn't until April and she says she doesn't have enough to last her until then. Pt wants to know if there is anyway she can be seen sooner

## 2024-01-18 NOTE — Telephone Encounter (Signed)
 Spoke with patient and clarified she needed refills on her losartan and metformin. Verbal ok given by Dr. Girtha Rm and medications sent to pharmacy.

## 2024-02-24 ENCOUNTER — Telehealth: Payer: Self-pay | Admitting: Orthopaedic Surgery

## 2024-02-24 NOTE — Telephone Encounter (Signed)
 Pt came into office requesting to submit for monovisc gel injection in right knee. Please call pt when approved at [phone].

## 2024-03-01 ENCOUNTER — Telehealth: Payer: Self-pay | Admitting: Family Medicine

## 2024-03-01 ENCOUNTER — Ambulatory Visit (INDEPENDENT_AMBULATORY_CARE_PROVIDER_SITE_OTHER): Payer: Medicare Other | Admitting: Family Medicine

## 2024-03-01 ENCOUNTER — Encounter: Payer: Self-pay | Admitting: Family Medicine

## 2024-03-01 VITALS — BP 129/74 | HR 69 | Temp 97.5°F | Resp 18 | Ht 65.5 in | Wt 171.0 lb

## 2024-03-01 DIAGNOSIS — Z7984 Long term (current) use of oral hypoglycemic drugs: Secondary | ICD-10-CM

## 2024-03-01 DIAGNOSIS — M5416 Radiculopathy, lumbar region: Secondary | ICD-10-CM

## 2024-03-01 DIAGNOSIS — M899 Disorder of bone, unspecified: Secondary | ICD-10-CM

## 2024-03-01 DIAGNOSIS — I10 Essential (primary) hypertension: Secondary | ICD-10-CM

## 2024-03-01 DIAGNOSIS — Z780 Asymptomatic menopausal state: Secondary | ICD-10-CM

## 2024-03-01 DIAGNOSIS — E782 Mixed hyperlipidemia: Secondary | ICD-10-CM | POA: Diagnosis not present

## 2024-03-01 DIAGNOSIS — G6289 Other specified polyneuropathies: Secondary | ICD-10-CM

## 2024-03-01 DIAGNOSIS — Z13820 Encounter for screening for osteoporosis: Secondary | ICD-10-CM

## 2024-03-01 DIAGNOSIS — E119 Type 2 diabetes mellitus without complications: Secondary | ICD-10-CM | POA: Diagnosis not present

## 2024-03-01 DIAGNOSIS — R110 Nausea: Secondary | ICD-10-CM

## 2024-03-01 DIAGNOSIS — E1140 Type 2 diabetes mellitus with diabetic neuropathy, unspecified: Secondary | ICD-10-CM | POA: Insufficient documentation

## 2024-03-01 DIAGNOSIS — M949 Disorder of cartilage, unspecified: Secondary | ICD-10-CM

## 2024-03-01 DIAGNOSIS — E875 Hyperkalemia: Secondary | ICD-10-CM

## 2024-03-01 DIAGNOSIS — M8580 Other specified disorders of bone density and structure, unspecified site: Secondary | ICD-10-CM

## 2024-03-01 LAB — POCT GLYCOSYLATED HEMOGLOBIN (HGB A1C): Hemoglobin A1C: 7.6 % — AB (ref 4.0–5.6)

## 2024-03-01 MED ORDER — ONDANSETRON 4 MG PO TBDP
4.0000 mg | ORAL_TABLET | Freq: Three times a day (TID) | ORAL | 2 refills | Status: DC | PRN
Start: 2024-03-01 — End: 2024-03-07

## 2024-03-01 MED ORDER — SIMVASTATIN 40 MG PO TABS: 40.0000 mg | ORAL_TABLET | Freq: Every evening | ORAL | 5 refills | Status: DC

## 2024-03-01 MED ORDER — GABAPENTIN 100 MG PO CAPS: 100.0000 mg | ORAL_CAPSULE | Freq: Every day | ORAL | 1 refills | Status: DC

## 2024-03-01 MED ORDER — TRAMADOL HCL 50 MG PO TABS: 50.0000 mg | ORAL_TABLET | Freq: Two times a day (BID) | ORAL | 0 refills | Status: DC | PRN

## 2024-03-01 MED ORDER — AMLODIPINE BESYLATE 5 MG PO TABS: 5.0000 mg | ORAL_TABLET | Freq: Every day | ORAL | 0 refills | Status: DC

## 2024-03-01 MED ORDER — AMLODIPINE BESYLATE 5 MG PO TABS: 5.0000 mg | ORAL_TABLET | Freq: Every day | ORAL | 1 refills | Status: DC

## 2024-03-01 MED ORDER — GLIPIZIDE ER 5 MG PO TB24: 5.0000 mg | ORAL_TABLET | Freq: Two times a day (BID) | ORAL | 1 refills | Status: DC

## 2024-03-01 MED ORDER — PIOGLITAZONE HCL 15 MG PO TABS: 15.0000 mg | ORAL_TABLET | Freq: Every day | ORAL | 1 refills | Status: DC

## 2024-03-01 NOTE — Assessment & Plan Note (Signed)
 She is at goal for lipid panel with simvastatin 40mg  daily.  Total cholesterol 142, TRIGS 176, HDL 52 and LDL 61.  She is tolerating simvastatin well.

## 2024-03-01 NOTE — Progress Notes (Signed)
 Established Patient Office Visit  Subjective   Patient ID: Patricia Travis, female    DOB: [birth year]  Age: 73 y.o. MRN: [id number]  Chief Complaint  Patient presents with   Medical Management of Chronic Issues    HPI Delightful 72 yo woman with advanced OA right knee, DMT2, HTN, lumbar DDD, mixed hyperlipidemia, peripheral neuropathy, hyperkalemia.  She got her mammogram at [facility] and it was normal.   She is taking tramadol for her right knee and her lower back.  Sometimes she takes Tramadol for peripheral neuropathy.  She tried Salonpas on her feet and did not find this effective.  She has meloxicam 15mg  and takes this most everyday.  She has never tried a meloxicam 7.5mg .   She is doing well with her DMT2. She is on Glipizide 5mg  BID, Actos 15mg  daily, metformin XR 500mg  four a day, Rybelsus 14mg  daily.  She denies any low blood sugars or confusion, HA, dizziness and diaphoresis.  A1c today was 7.6%.  She admits to dietary indiscretions.   Her potassium was mildly elevated last blood draw. (5.6 mmol/L)   Will check again today.  Advised may have to change her BP medication if potassium remains high.   She just saw her eye MD, Dr. Clydene Pugh in [place].  Will ask for the results of her eye exam.   She has a painful spot on the plantar surface of her right foot.  It has been there for a while and it hurts when she walks.   We made the referral for DEXA scan but she has not heard from them.  Will put another referral in    ROS    Objective:     BP 129/74 (BP Location: Left Arm, Patient Position: Sitting, Cuff Size: Normal)   Pulse 69   Temp (!) 97.5 F (36.4 C) (Oral)   Resp 18   Ht 5' 5.5" (1.664 m)   Wt 171 lb (77.6 kg)   SpO2 95%   BMI 28.02 kg/m    Physical Exam Vitals and nursing note reviewed.  Constitutional:      Appearance: Normal appearance.  HENT:     Head: Normocephalic and atraumatic.  Eyes:     Conjunctiva/sclera: Conjunctivae normal.   Cardiovascular:     Rate and Rhythm: Normal rate and regular rhythm.  Pulmonary:     Effort: Pulmonary effort is normal.     Breath sounds: Normal breath sounds.  Musculoskeletal:     Right lower leg: No edema.     Left lower leg: No edema.     Right foot: No deformity.     Left foot: No deformity.       Feet:  Feet:     Comments: 0.2 cm callus on the plantar surface of her right foot.  Trimmed this down with a #15 blade.  Patient tolerated this well.   Skin:    General: Skin is warm and dry.  Neurological:     Mental Status: She is alert and oriented to person, place, and time.  Psychiatric:        Mood and Affect: Mood normal.        Behavior: Behavior normal.        Thought Content: Thought content normal.        Judgment: Judgment normal.       Diabetic foot exam was performed with the following findings:   No data filed      Results for orders placed or  performed in visit on 03/01/24  POCT glycosylated hemoglobin (Hb A1C)  Result Value Ref Range   Hemoglobin A1C 7.6 (A) 4.0 - 5.6 %   HbA1c POC (<> result, manual entry)     HbA1c, POC (prediabetic range)     HbA1c, POC (controlled diabetic range)        The 10-year ASCVD risk score (Arnett DK, et al., 2019) is: 32.8%    Assessment & Plan:  Type 2 diabetes mellitus without complication, without long-term current use of insulin (HCC) -     POCT glycosylated hemoglobin (Hb A1C) -     glipiZIDE ER; Take 1 tablet (5 mg total) by mouth 2 (two) times daily.  Dispense: 180 tablet; Refill: 1 -     Pioglitazone HCl; Take 1 tablet (15 mg total) by mouth daily.  Dispense: 90 tablet; Refill: 1 -     Microalbumin / creatinine urine ratio  Primary hypertension -     amLODIPine Besylate; Take 1 tablet (5 mg total) by mouth daily.  Dispense: 90 tablet; Refill: 1  Mixed hyperlipidemia -     Simvastatin; Take 1 tablet (40 mg total) by mouth every evening.  Dispense: 30 tablet; Refill: 5  Other polyneuropathy -      Gabapentin; Take 1 capsule (100 mg total) by mouth at bedtime.  Dispense: 90 capsule; Refill: 1  Nausea -     Ondansetron; Take 1 tablet (4 mg total) by mouth every 8 (eight) hours as needed for nausea or vomiting.  Dispense: 20 tablet; Refill: 2  Lumbar radiculopathy -     traMADol HCl; Take 1-2 tablets (50-100 mg total) by mouth every 12 (twelve) hours as needed for moderate pain (pain score 4-6).  Dispense: 90 tablet; Refill: 0  Hyperkalemia Assessment & Plan: Checking her potassium level today.  Meloxicam and losartan likely have caused the elevated potassium.  If hyperkalemia continues will stop losartan and recheck potassium.  Will provide a list of foods that are high in potassium for her to avoid.  .    Orders: -     CMP14+EGFR  Encounter for screening for osteoporosis -     DG Bone Density; Future  Osteopenia after menopause -     DG Bone Density; Future  Type 2 diabetes mellitus with diabetic neuropathy, without long-term current use of insulin (HCC) Assessment & Plan: She is doing well with her DMT2. She is on Glipizide 5mg  BID, Actos 15mg  daily, metformin XR 500mg  four a day, Rybelsus 14mg  daily.  She denies any low blood sugars or confusion, HA, dizziness and diaphoresis.  A1c today was 7.6%.  She admits to occasional dietary indiscretions.  Will contact [facility] for her last eye exam.  She checks her feet daily.  Continue same medications.  Checking urine microalbumin creatinine ratio.  FU in 3 months.     Essential hypertension Assessment & Plan: Doing well with her BP readings.  129/74 today.  Takes amlodipine 5mg  and losartan 25mg  daily.  Continue current medications.     Disorder of bone and cartilage Assessment & Plan: Did not hear from bone density referral.  Would like to go to [facility] for this.        Return in about 3 months (around 05/31/2024).    Patricia Medina, MD

## 2024-03-01 NOTE — Assessment & Plan Note (Addendum)
 Checking her potassium level today.  Meloxicam and losartan likely have caused the elevated potassium.  If hyperkalemia continues will stop losartan and recheck potassium.  Will provide a list of foods that are high in potassium for her to avoid.  Marland Kitchen

## 2024-03-01 NOTE — Assessment & Plan Note (Addendum)
 Doing well with her BP readings.  129/74 today.  Takes amlodipine 5mg  and losartan 25mg  daily.  Continue current medications.

## 2024-03-01 NOTE — Telephone Encounter (Signed)
 Communication  Reason for CRM: Pt called back regarding the meloxicam she discussed with her PCP. She says her current supply is the 15 MG and it is not scored so she will need a new prescription for 7 MG.        Wants to speak to the clinic about this        Best contact: [phone]

## 2024-03-01 NOTE — Assessment & Plan Note (Signed)
 Did not hear from bone density referral.  Would like to go to [facility] for this.

## 2024-03-01 NOTE — Assessment & Plan Note (Addendum)
 She is doing well with her DMT2. She is on Glipizide 5mg  BID, Actos 15mg  daily, metformin XR 500mg  four a day, Rybelsus 14mg  daily.  She denies any low blood sugars or confusion, HA, dizziness and diaphoresis.  A1c today was 7.6%.  She admits to occasional dietary indiscretions.  Will contact [facility] for her last eye exam.  She checks her feet daily.  Continue same medications.  Checking urine microalbumin creatinine ratio.  FU in 3 months.

## 2024-03-02 ENCOUNTER — Other Ambulatory Visit: Payer: Self-pay | Admitting: Family Medicine

## 2024-03-02 DIAGNOSIS — M170 Bilateral primary osteoarthritis of knee: Secondary | ICD-10-CM

## 2024-03-02 LAB — MICROALBUMIN / CREATININE URINE RATIO
Creatinine, Urine: 88.1 mg/dL
Microalb/Creat Ratio: 14 mg/g{creat} (ref 0–29)
Microalbumin, Urine: 12.5 ug/mL

## 2024-03-02 LAB — CMP14+EGFR
ALT: 25 IU/L (ref 0–32)
AST: 20 IU/L (ref 0–40)
Albumin: 4.5 g/dL (ref 3.8–4.8)
Alkaline Phosphatase: 97 IU/L (ref 44–121)
BUN/Creatinine Ratio: 12 (ref 12–28)
BUN: 9 mg/dL (ref 8–27)
Bilirubin Total: 0.3 mg/dL (ref 0.0–1.2)
CO2: 21 mmol/L (ref 20–29)
Calcium: 9.4 mg/dL (ref 8.7–10.3)
Chloride: 103 mmol/L (ref 96–106)
Creatinine, Ser: 0.73 mg/dL (ref 0.57–1.00)
Globulin, Total: 2.7 g/dL (ref 1.5–4.5)
Glucose: 146 mg/dL — ABNORMAL HIGH (ref 70–99)
Potassium: 5 mmol/L (ref 3.5–5.2)
Sodium: 142 mmol/L (ref 134–144)
Total Protein: 7.2 g/dL (ref 6.0–8.5)
eGFR: 86 mL/min/{1.73_m2} (ref >59)

## 2024-03-02 MED ORDER — MELOXICAM 7.5 MG PO TABS: 7.5000 mg | ORAL_TABLET | Freq: Every day | ORAL | 0 refills | Status: DC

## 2024-03-07 ENCOUNTER — Telehealth: Payer: Self-pay

## 2024-03-07 ENCOUNTER — Other Ambulatory Visit: Payer: Self-pay | Admitting: Family Medicine

## 2024-03-07 DIAGNOSIS — E782 Mixed hyperlipidemia: Secondary | ICD-10-CM

## 2024-03-07 DIAGNOSIS — M170 Bilateral primary osteoarthritis of knee: Secondary | ICD-10-CM

## 2024-03-07 DIAGNOSIS — M5416 Radiculopathy, lumbar region: Secondary | ICD-10-CM

## 2024-03-07 DIAGNOSIS — E119 Type 2 diabetes mellitus without complications: Secondary | ICD-10-CM

## 2024-03-07 DIAGNOSIS — R110 Nausea: Secondary | ICD-10-CM

## 2024-03-07 DIAGNOSIS — I10 Essential (primary) hypertension: Secondary | ICD-10-CM

## 2024-03-07 MED ORDER — SIMVASTATIN 40 MG PO TABS: 40.0000 mg | ORAL_TABLET | Freq: Every evening | ORAL | 1 refills | Status: DC

## 2024-03-07 MED ORDER — TRAMADOL HCL 50 MG PO TABS: 50.0000 mg | ORAL_TABLET | Freq: Two times a day (BID) | ORAL | 0 refills | Status: DC | PRN

## 2024-03-07 MED ORDER — AMLODIPINE BESYLATE 5 MG PO TABS: 5.0000 mg | ORAL_TABLET | Freq: Every day | ORAL | 1 refills | Status: DC

## 2024-03-07 MED ORDER — PIOGLITAZONE HCL 15 MG PO TABS: 15.0000 mg | ORAL_TABLET | Freq: Every day | ORAL | 1 refills | Status: DC

## 2024-03-07 MED ORDER — ONDANSETRON 4 MG PO TBDP: 4.0000 mg | ORAL_TABLET | Freq: Three times a day (TID) | ORAL | 2 refills | Status: DC | PRN

## 2024-03-07 MED ORDER — MELOXICAM 7.5 MG PO TABS: 7.5000 mg | ORAL_TABLET | Freq: Every day | ORAL | 0 refills | Status: DC

## 2024-03-07 MED ORDER — GABAPENTIN 600 MG PO TABS: 600.0000 mg | ORAL_TABLET | Freq: Every day | ORAL | 1 refills | Status: DC

## 2024-03-07 MED ORDER — GLIPIZIDE ER 5 MG PO TB24: 5.0000 mg | ORAL_TABLET | Freq: Two times a day (BID) | ORAL | 1 refills | Status: DC

## 2024-03-07 MED ORDER — SIMVASTATIN 40 MG PO TABS: 40.0000 mg | ORAL_TABLET | Freq: Every evening | ORAL | 5 refills | Status: DC

## 2024-03-07 MED ORDER — MELOXICAM 7.5 MG PO TABS
7.5000 mg | ORAL_TABLET | Freq: Every day | ORAL | 0 refills | Status: DC
Start: 2024-03-07 — End: 2024-05-26

## 2024-03-07 NOTE — Telephone Encounter (Signed)
[facility]

## 2024-03-07 NOTE — Telephone Encounter (Signed)
 [facility] confirmed that they do not have an active account with patient. Prescriptions were not received.

## 2024-03-07 NOTE — Telephone Encounter (Signed)
 Copied from CRM [id number]. Topic: Clinical - Prescription Issue >> Mar 07, 2024  9:29 AM Artemio Larry wrote: Reason for CRM: Patient was seen 4/8 and her medications were sent to the incorrect pharmacy. [facility] as she is completely out of one of her medications. Meloxicam, amLODipine, gabapentin, glipiZIDE, ondansetron, pioglitazone, simvastatin and the traMADol

## 2024-03-10 ENCOUNTER — Telehealth: Payer: Self-pay | Admitting: Orthopaedic Surgery

## 2024-03-10 NOTE — Telephone Encounter (Signed)
 Patient called. She would like to know the status of her gel injection approval. Her cb# [phone]

## 2024-03-10 NOTE — Telephone Encounter (Signed)
 VOB submitted for Monovisc, right knee

## 2024-03-10 NOTE — Telephone Encounter (Signed)
 Called and left a VM advising patient that next available gel injection would need to be after 03/23/2024 and to CB with any questions.

## 2024-03-22 ENCOUNTER — Encounter: Payer: Self-pay | Admitting: Family Medicine

## 2024-03-28 ENCOUNTER — Encounter: Payer: Self-pay | Admitting: Orthopaedic Surgery

## 2024-03-28 ENCOUNTER — Ambulatory Visit (INDEPENDENT_AMBULATORY_CARE_PROVIDER_SITE_OTHER): Admitting: Orthopaedic Surgery

## 2024-03-28 DIAGNOSIS — M1711 Unilateral primary osteoarthritis, right knee: Secondary | ICD-10-CM | POA: Diagnosis not present

## 2024-03-28 MED ORDER — HYALURONAN 88 MG/4ML IX SOSY
88.0000 mg | PREFILLED_SYRINGE | INTRA_ARTICULAR | Status: AC | PRN
  Administered 2024-03-28: 88 mg via INTRA_ARTICULAR

## 2024-03-28 NOTE — Progress Notes (Signed)
   Procedure Note  Patient: Patricia Travis             Date of Birth: [birth year]           MRN: [id number]             Visit Date: 03/28/2024  Procedures: Visit Diagnoses:  1. Unilateral primary osteoarthritis, right knee     Large Joint Inj: R knee on 03/28/2024 9:17 AM Indications: diagnostic evaluation and pain Details: 22 G 1.5 in needle, superolateral approach  Arthrogram: No  Medications: 88 mg Hyaluronan 88 MG/4ML Outcome: tolerated well, no immediate complications Procedure, treatment alternatives, risks and benefits explained, specific risks discussed. Consent was given by the patient. Immediately prior to procedure a time out was called to verify the correct patient, procedure, equipment, support staff and site/side marked as required. Patient was prepped and draped in the usual sterile fashion.    The patient comes in today for a scheduled hyaluronic acid injection with Monovisc in her right knee to treat the pain from osteoarthritis.  She is failed other conservative treatment measures and can no longer have steroids.  She last had this injection in October of last year and it does last for a while.  She has had no acute change in her medical status.  Her right knee shows no effusion today.  I did place Monovisc in her right knee today without difficulty.  She can have this again in 6 months if needed.  Lot #[id number]

## 2024-04-11 ENCOUNTER — Other Ambulatory Visit: Payer: Self-pay | Admitting: Family Medicine

## 2024-04-11 MED ORDER — LOSARTAN POTASSIUM 25 MG PO TABS: 25.0000 mg | ORAL_TABLET | Freq: Every day | ORAL | 0 refills | Status: DC

## 2024-04-11 NOTE — Telephone Encounter (Signed)
 Last Fill: 01/18/24  Last OV: 03/01/24 Next OV: 05/31/24  Routing to provider for review/authorization.

## 2024-04-11 NOTE — Telephone Encounter (Signed)
 Copied from CRM [id number]. Topic: Clinical - Medication Refill >> Apr 11, 2024  9:38 AM Alysia Jumbo S wrote: Medication: losartan  (COZAAR ) 25 MG tablet  Has the patient contacted their pharmacy? No (Agent: If no, request that the patient contact the pharmacy for the refill. If patient does not wish to contact the pharmacy document the reason why and proceed with request.) (Agent: If yes, when and what did the pharmacy advise?)  This is the patient's preferred pharmacy:  [facility] [place], [place] - [address] [place] [address] Phone: [phone] Fax: [phone]  Is this the correct pharmacy for this prescription? Yes If no, delete pharmacy and type the correct one.   Has the prescription been filled recently? No  Is the patient out of the medication? Yes  Has the patient been seen for an appointment in the last year OR does the patient have an upcoming appointment? Yes  Can we respond through MyChart? No  Agent: Please be advised that Rx refills may take up to 3 business days. We ask that you follow-up with your pharmacy.

## 2024-05-09 ENCOUNTER — Telehealth: Payer: Self-pay | Admitting: Family Medicine

## 2024-05-09 MED ORDER — RYBELSUS 14 MG PO TABS
1.0000 | ORAL_TABLET | Freq: Every morning | ORAL | 2 refills | Status: DC
Start: 2024-05-09 — End: 2024-06-09

## 2024-05-09 NOTE — Telephone Encounter (Unsigned)
 Copied from CRM [id number]. Topic: Clinical - Medication Refill >> May 09, 2024  8:25 AM Emylou G wrote: Medication: RYBELSUS  14 MG TABS  Has the patient contacted their pharmacy? No (Agent: If no, request that the patient contact the pharmacy for the refill. If patient does not wish to contact the pharmacy document the reason why and proceed with request.) (Agent: If yes, when and what did the pharmacy advise?)  This is the patient's preferred pharmacy:  [facility] [place], [place] - [address] [place] [address] Phone: [phone] Fax: [phone]  Is this the correct pharmacy for this prescription? Yes If no, delete pharmacy and type the correct one.   Has the prescription been filled recently? No  Is the patient out of the medication? Yes  Has the patient been seen for an appointment in the last year OR does the patient have an upcoming appointment? Yes  Can we respond through MyChart? No  Agent: Please be advised that Rx refills may take up to 3 business days. We ask that you follow-up with your pharmacy.

## 2024-05-21 ENCOUNTER — Other Ambulatory Visit: Payer: Self-pay | Admitting: Family Medicine

## 2024-05-21 DIAGNOSIS — M170 Bilateral primary osteoarthritis of knee: Secondary | ICD-10-CM

## 2024-05-23 ENCOUNTER — Encounter: Payer: Self-pay | Admitting: Ophthalmology

## 2024-05-26 ENCOUNTER — Telehealth: Payer: Self-pay | Admitting: Family Medicine

## 2024-05-26 DIAGNOSIS — M170 Bilateral primary osteoarthritis of knee: Secondary | ICD-10-CM

## 2024-05-26 NOTE — Anesthesia Preprocedure Evaluation (Addendum)
 Anesthesia Evaluation  Patient identified by MRN, date of birth, ID band Patient awake    Reviewed: Allergy & Precautions, NPO status , Patient's Chart, lab work & pertinent test results  History of Anesthesia Complications (+) PONV and history of anesthetic complications  Airway Mallampati: III   Neck ROM: Full    Dental no notable dental hx.    Pulmonary asthma , COPD, former smoker (quit 1993)   Pulmonary exam normal breath sounds clear to auscultation       Cardiovascular hypertension, Normal cardiovascular exam Rhythm:Regular Rate:Normal     Neuro/Psych  Headaches  Neuromuscular disease (neuropathy)    GI/Hepatic ,GERD  ,,  Endo/Other  diabetes, Type 2    Renal/GU      Musculoskeletal  (+) Arthritis ,    Abdominal   Peds  Hematology  (+) Blood dyscrasia, anemia   Anesthesia Other Findings   Reproductive/Obstetrics                              Anesthesia Physical Anesthesia Plan  ASA: 3  Anesthesia Plan: MAC   Post-op Pain Management:    Induction: Intravenous  PONV Risk Score and Plan: 3 and Treatment may vary due to age or medical condition, Midazolam  and TIVA  Airway Management Planned: Natural Airway and Nasal Cannula  Additional Equipment:   Intra-op Plan:   Post-operative Plan:   Informed Consent: I have reviewed the patients History and Physical, chart, labs and discussed the procedure including the risks, benefits and alternatives for the proposed anesthesia with the patient or authorized representative who has indicated his/her understanding and acceptance.     Dental advisory given  Plan Discussed with: CRNA  Anesthesia Plan Comments: (LMA/GETA backup discussed.  Patient consented for risks of anesthesia including but not limited to:  - adverse reactions to medications - damage to eyes, teeth, lips or other oral mucosa - nerve damage due to positioning   - sore throat or hoarseness - damage to heart, brain, nerves, lungs, other parts of body or loss of life  Informed patient about role of CRNA in peri- and intra-operative care.  Patient voiced understanding.)         Anesthesia Quick Evaluation

## 2024-05-26 NOTE — Telephone Encounter (Unsigned)
 Copied from CRM [id number]. Topic: Clinical - Medication Refill >> May 26, 2024 12:13 PM Zebedee SAUNDERS wrote: Medication: meloxicam  (MOBIC ) 7.5 MG tablet  Has the patient contacted their pharmacy? Yes (Agent: If no, request that the patient contact the pharmacy for the refill. If patient does not wish to contact the pharmacy document the reason why and proceed with request.) (Agent: If yes, when and what did the pharmacy advise?)Pharmacy need PCP approval  This is the patient's preferred pharmacy:  [facility] [place], [place] - [address] [place] [address] Phone: [phone] Fax: [phone]  Is this the correct pharmacy for this prescription? Yes If no, delete pharmacy and type the correct one.   Has the prescription been filled recently? Yes  Is the patient out of the medication? Yes  Has the patient been seen for an appointment in the last year OR does the patient have an upcoming appointment? Yes  Can we respond through MyChart? Yes  Agent: Please be advised that Rx refills may take up to 3 business days. We ask that you follow-up with your pharmacy.

## 2024-05-30 MED ORDER — MELOXICAM 7.5 MG PO TABS
7.5000 mg | ORAL_TABLET | Freq: Every day | ORAL | 0 refills | Status: DC
Start: 2024-05-30 — End: 2024-06-09

## 2024-05-30 NOTE — Discharge Instructions (Signed)

## 2024-05-31 ENCOUNTER — Ambulatory Visit: Admitting: Family Medicine

## 2024-06-02 ENCOUNTER — Ambulatory Visit: Admitting: Family Medicine

## 2024-06-06 MED ORDER — LACTATED RINGERS IV SOLN: INTRAVENOUS | Status: DC

## 2024-06-06 MED ORDER — ARMC OPHTHALMIC DILATING DROPS
1.0000 | OPHTHALMIC | Status: DC | PRN
  Administered 2024-06-07 (×3): 1 via OPHTHALMIC

## 2024-06-06 MED ORDER — TETRACAINE HCL 0.5 % OP SOLN
1.0000 [drp] | OPHTHALMIC | Status: DC | PRN
  Administered 2024-06-07 (×3): 1 [drp] via OPHTHALMIC

## 2024-06-07 ENCOUNTER — Encounter: Payer: Self-pay | Admitting: Ophthalmology

## 2024-06-07 ENCOUNTER — Ambulatory Visit: Payer: Self-pay | Admitting: Anesthesiology

## 2024-06-07 ENCOUNTER — Ambulatory Visit
Admission: RE | Admit: 2024-06-07 | Discharge: 2024-06-07 | Disposition: A | Discharge status: Home / Self Care | Attending: Ophthalmology | Admitting: Ophthalmology

## 2024-06-07 ENCOUNTER — Other Ambulatory Visit: Payer: Self-pay

## 2024-06-07 ENCOUNTER — Encounter
Admission: RE | Disposition: A | Discharge status: Home / Self Care | Payer: Self-pay | Source: Home / Self Care | Attending: Ophthalmology

## 2024-06-07 DIAGNOSIS — Z8249 Family history of ischemic heart disease and other diseases of the circulatory system: Secondary | ICD-10-CM | POA: Insufficient documentation

## 2024-06-07 DIAGNOSIS — Z7984 Long term (current) use of oral hypoglycemic drugs: Secondary | ICD-10-CM | POA: Diagnosis not present

## 2024-06-07 DIAGNOSIS — H2512 Age-related nuclear cataract, left eye: Secondary | ICD-10-CM | POA: Diagnosis present

## 2024-06-07 DIAGNOSIS — M1990 Unspecified osteoarthritis, unspecified site: Secondary | ICD-10-CM | POA: Diagnosis not present

## 2024-06-07 DIAGNOSIS — I10 Essential (primary) hypertension: Secondary | ICD-10-CM | POA: Diagnosis not present

## 2024-06-07 DIAGNOSIS — K219 Gastro-esophageal reflux disease without esophagitis: Secondary | ICD-10-CM | POA: Diagnosis not present

## 2024-06-07 DIAGNOSIS — E1136 Type 2 diabetes mellitus with diabetic cataract: Secondary | ICD-10-CM | POA: Diagnosis not present

## 2024-06-07 DIAGNOSIS — Z833 Family history of diabetes mellitus: Secondary | ICD-10-CM | POA: Diagnosis not present

## 2024-06-07 DIAGNOSIS — E1140 Type 2 diabetes mellitus with diabetic neuropathy, unspecified: Secondary | ICD-10-CM | POA: Diagnosis not present

## 2024-06-07 DIAGNOSIS — Z87891 Personal history of nicotine dependence: Secondary | ICD-10-CM | POA: Diagnosis not present

## 2024-06-07 DIAGNOSIS — J4489 Other specified chronic obstructive pulmonary disease: Secondary | ICD-10-CM | POA: Diagnosis not present

## 2024-06-07 HISTORY — PX: CATARACT EXTRACTION W/PHACO: SHX586

## 2024-06-07 LAB — GLUCOSE, CAPILLARY: Glucose-Capillary: 151 mg/dL — ABNORMAL HIGH (ref 70–99)

## 2024-06-07 SURGERY — PHACOEMULSIFICATION, CATARACT, WITH IOL INSERTION
Anesthesia: Monitor Anesthesia Care | Site: Eye | Laterality: Left

## 2024-06-07 MED ORDER — FENTANYL CITRATE (PF) 100 MCG/2ML IJ SOLN
INTRAMUSCULAR | Status: AC
  Filled 2024-06-07: qty 2

## 2024-06-07 MED ORDER — LIDOCAINE HCL (PF) 2 % IJ SOLN
INTRAOCULAR | Status: DC | PRN
  Administered 2024-06-07: 2 mL

## 2024-06-07 MED ORDER — MIDAZOLAM HCL 2 MG/2ML IJ SOLN
INTRAMUSCULAR | Status: AC
  Filled 2024-06-07: qty 2

## 2024-06-07 MED ORDER — SIGHTPATH DOSE#1 NA CHONDROIT SULF-NA HYALURON 40-17 MG/ML IO SOLN
INTRAOCULAR | Status: DC | PRN
  Administered 2024-06-07: 1 mL via INTRAOCULAR

## 2024-06-07 MED ORDER — FENTANYL CITRATE (PF) 100 MCG/2ML IJ SOLN
INTRAMUSCULAR | Status: DC | PRN
  Administered 2024-06-07: 50 ug via INTRAVENOUS

## 2024-06-07 MED ORDER — BRIMONIDINE TARTRATE-TIMOLOL 0.2-0.5 % OP SOLN
OPHTHALMIC | Status: DC | PRN
Start: 2024-06-07 — End: 2024-06-07
  Administered 2024-06-07: 1 [drp] via OPHTHALMIC

## 2024-06-07 MED ORDER — SIGHTPATH DOSE#1 BSS IO SOLN
INTRAOCULAR | Status: DC | PRN
  Administered 2024-06-07: 15 mL via INTRAOCULAR

## 2024-06-07 MED ORDER — ONDANSETRON HCL 4 MG/2ML IJ SOLN
4.0000 mg | Freq: Once | INTRAMUSCULAR | Status: AC
  Administered 2024-06-07: 4 mg via INTRAVENOUS

## 2024-06-07 MED ORDER — MOXIFLOXACIN HCL 0.5 % OP SOLN
OPHTHALMIC | Status: DC | PRN
  Administered 2024-06-07: .2 mL via OPHTHALMIC

## 2024-06-07 MED ORDER — SIGHTPATH DOSE#1 BSS IO SOLN
INTRAOCULAR | Status: DC | PRN
  Administered 2024-06-07: 41 mL via OPHTHALMIC

## 2024-06-07 MED ORDER — MIDAZOLAM HCL 2 MG/2ML IJ SOLN
INTRAMUSCULAR | Status: DC | PRN
  Administered 2024-06-07: 2 mg via INTRAVENOUS

## 2024-06-07 SURGICAL SUPPLY — 15 items
CANNULA ANT/CHMB 27G (MISCELLANEOUS) IMPLANT
CATARACT SUITE SIGHTPATH (MISCELLANEOUS) ×1 IMPLANT
CYSTOTOME ANGL RVRS SHRT 25G (CUTTER) ×1 IMPLANT
FEE CATARACT SUITE SIGHTPATH (MISCELLANEOUS) ×1 IMPLANT
GLOVE BIOGEL PI IND STRL 8 (GLOVE) ×1 IMPLANT
GLOVE SURG LX STRL 8.0 MICRO (GLOVE) ×1 IMPLANT
GLOVE SURG SYN 6.5 PF PI BL (GLOVE) ×1 IMPLANT
GLOVE SURG SYN 8.0 PF PI (GLOVE) IMPLANT
LENS IOL TECNIS EYHANCE 22.0 (Intraocular Lens) IMPLANT
NDL FILTER BLUNT 18X1 1/2 (NEEDLE) ×1 IMPLANT
NEEDLE FILTER BLUNT 18X1 1/2 (NEEDLE) ×1 IMPLANT
PACK VIT ANT 23G (MISCELLANEOUS) IMPLANT
RING MALYGIN (MISCELLANEOUS) IMPLANT
SUTURE EHLN 10-0 CS-B-6CS-B-6 (SUTURE) IMPLANT
SYR 3ML LL SCALE MARK (SYRINGE) ×1 IMPLANT

## 2024-06-07 NOTE — H&P (Signed)
 [facility]   Primary Care Physician:  Ziglar, Devere POUR, MD Ophthalmologist: Dr. Jaye  Pre-Procedure History & Physical: HPI:  Patricia Travis is a 73 y.o. female here for cataract surgery.   Past Medical History:  Diagnosis Date   Anemia    hx- high school   Asthma    Complication of anesthesia    problem being combative awakening from  hysterectomy   79 but had total knee 08 and did fine   COPD (chronic obstructive pulmonary disease) (HCC)    Diabetes mellitus    Dyspnea    GERD (gastroesophageal reflux disease)    occ   Headache(784.0)    migraines hx - couple/month   Hyperlipidemia    Hypertension    Osteoarthritis    fingers, hands   Osteopenia    PONV (postoperative nausea and vomiting)     Past Surgical History:  Procedure Laterality Date   ABDOMINAL HYSTERECTOMY     total   CHOLECYSTECTOMY  08/16/2012   Procedure: LAPAROSCOPIC CHOLECYSTECTOMY WITH INTRAOPERATIVE CHOLANGIOGRAM;  Surgeon: Donnice POUR. Belinda, MD;  Location: [facility] OR;  Service: General;  Laterality: N/A;   KNEE ARTHROSCOPY  1990   L   KNEE CARTILAGE SURGERY  1992   L   OOPHORECTOMY     TOTAL KNEE ARTHROPLASTY Left 06/01/2007   TOTAL KNEE REVISION Left 08/13/2021   Procedure: LEFT TOTAL KNEE REVISION;  Surgeon: Vernetta Lonni GRADE, MD;  Location: [facility] OR;  Service: Orthopedics;  Laterality: Left;    Prior to Admission medications   Medication Sig Start Date End Date Taking? Authorizing Provider  amLODipine  (NORVASC ) 5 MG tablet Take 1 tablet (5 mg total) by mouth daily. 03/07/24  Yes Ziglar, Susan K, MD  aspirin  81 MG chewable tablet Chew 1 tablet (81 mg total) by mouth 2 (two) times daily. 08/15/21  Yes Vernetta Lonni GRADE, MD  gabapentin  (NEURONTIN ) 600 MG tablet Take 1 tablet (600 mg total) by mouth at bedtime. 03/07/24  Yes Ziglar, Susan K, MD  glipiZIDE  (GLUCOTROL  XL) 5 MG 24 hr tablet Take 1 tablet (5 mg total) by mouth 2 (two) times daily. 03/07/24  Yes Ziglar, Susan K, MD   losartan  (COZAAR ) 25 MG tablet Take 1 tablet (25 mg total) by mouth daily. 04/11/24  Yes Ziglar, Susan K, MD  meloxicam  (MOBIC ) 7.5 MG tablet Take 1 tablet (7.5 mg total) by mouth daily. 05/30/24  Yes Ziglar, Susan K, MD  metFORMIN  (GLUCOPHAGE -XR) 500 MG 24 hr tablet Take 4 tablets (2,000 mg total) by mouth daily. 01/18/24  Yes Ziglar, Susan K, MD  ondansetron  (ZOFRAN  ODT) 4 MG disintegrating tablet Take 1 tablet (4 mg total) by mouth every 8 (eight) hours as needed for nausea or vomiting. 03/07/24  Yes Ziglar, Susan K, MD  pioglitazone  (ACTOS ) 15 MG tablet Take 1 tablet (15 mg total) by mouth daily. 03/07/24  Yes Ziglar, Susan K, MD  RYBELSUS  14 MG TABS Take 1 tablet (14 mg total) by mouth every morning. 05/09/24  Yes Ziglar, Susan K, MD  simvastatin  (ZOCOR ) 40 MG tablet Take 1 tablet (40 mg total) by mouth every evening. 03/07/24  Yes Ziglar, Susan K, MD  albuterol  (VENTOLIN  HFA) 108 (90 Base) MCG/ACT inhaler Inhale 1-2 puffs into the lungs every 6 (six) hours as needed for wheezing or shortness of breath.    [provider]  Blood Glucose Monitoring Suppl (ONETOUCH VERIO) w/Device KIT Use as instructed to check blood sugars 05/09/22   Von [place], MD  dapagliflozin  propanediol (  FARXIGA ) 5 MG TABS tablet Take 1 tablet (5 mg total) by mouth daily before breakfast. Patient not taking: Reported on 03/01/2024 03/24/22   Kassie Mallick, MD  gabapentin  (NEURONTIN ) 100 MG capsule Take 1 capsule (100 mg total) by mouth at bedtime. 03/01/24   Ziglar, Susan K, MD  glucose blood (ONETOUCH VERIO) test strip 1 each by Other route daily. 04/02/22   Shamleffer, Ibtehal Jaralla, MD  Lancets [facility] ULTRASOFT) lancets Use as instructed 1 daily 03/24/22   Kassie Mallick, MD  traMADol  (ULTRAM ) 50 MG tablet Take 1-2 tablets (50-100 mg total) by mouth every 12 (twelve) hours as needed for moderate pain (pain score 4-6). Patient taking differently: Take 50-100 mg by mouth daily. 03/07/24   Ziglar, Devere POUR, MD    Allergies as  of 05/10/2024 - Review Complete 03/28/2024  Allergen Reaction Noted   Penicillins Anaphylaxis 02/03/2007   Ace inhibitors Cough 02/26/2009   Cortisone acetate     Tetracyclines & related Nausea And Vomiting 10/02/2011    Family History  Problem Relation Age of Onset   Heart attack Father    Coronary artery disease Father    Heart disease Father    Arthritis Mother        RA, and osteoarthritis   Cancer Maternal Grandmother        breast   Breast cancer Maternal Grandmother 86   Breast cancer Maternal Aunt 28   Diabetes Sister    Diabetes Other        grandparents   Cancer Paternal Grandfather     Social History   Socioeconomic History   Marital status: Married    Spouse name: Not on file   Number of children: Not on file   Years of education: Not on file   Highest education level: Not on file  Occupational History   Occupation: Civil Service fast streamer: [employer]    Comment: Accounts Payable  Tobacco Use   Smoking status: Former    Current packs/day: 0.00    Average packs/day: 1.5 packs/day for 25.0 years (37.5 ttl pk-yrs)    Types: Cigarettes    Start date: 09/08/1967    Quit date: 09/07/1992    Years since quitting: 31.7   Smokeless tobacco: Never  Vaping Use   Vaping status: Never Used  Substance and Sexual Activity   Alcohol use: No   Drug use: No   Sexual activity: Not on file  Other Topics Concern   Not on file  Social History Narrative   Not on file   Social Drivers of Health   Financial Resource Strain: Not on file  Food Insecurity: Not on file  Transportation Needs: Not on file  Physical Activity: Not on file  Stress: Not on file  Social Connections: Not on file  Intimate Partner Violence: Not on file    Review of Systems: See HPI, otherwise negative ROS  Physical Exam: BP (!) 154/75   Temp (!) 97 F (36.1 C) (Temporal)   Resp 16   Ht 5' 5 (1.651 m)   Wt 75.3 kg   SpO2 98%   BMI 27.62 kg/m  General:   Alert, cooperative. Head:   Normocephalic and atraumatic. Respiratory:  Normal work of breathing. Cardiovascular:  NAD  Impression/Plan: Patricia Travis is here for cataract surgery.  Risks, benefits, limitations, and alternatives regarding cataract surgery have been reviewed with the patient.  Questions have been answered.  All parties agreeable.   Elsie Carmine, MD  06/07/2024, 11:03 AM

## 2024-06-07 NOTE — Transfer of Care (Signed)
 Immediate Anesthesia Transfer of Care Note  Patient: Patricia Travis  Procedure(s) Performed: PHACOEMULSIFICATION, CATARACT, WITH IOL INSERTION 6.57 00:36.1 (Left: Eye)  Patient Location: PACU  Anesthesia Type:MAC  Level of Consciousness: awake  Airway & Oxygen Therapy: Patient Spontanous Breathing  Post-op Assessment: Report given to RN and Post -op Vital signs reviewed and stable  Post vital signs: Reviewed and stable  Last Vitals:  Vitals Value Taken Time  BP 134/80 06/07/24 11:28  Temp    Pulse 86 06/07/24 11:28  Resp 19 06/07/24 11:28  SpO2 94 % 06/07/24 11:28  Vitals shown include unfiled device data.  Last Pain:  Vitals:   06/07/24 1024  TempSrc: Temporal  PainSc: 0-No pain         Complications: No notable events documented.

## 2024-06-07 NOTE — Anesthesia Postprocedure Evaluation (Signed)
 Anesthesia Post Note  Patient: Camelia LITTIE Sauers  Procedure(s) Performed: PHACOEMULSIFICATION, CATARACT, WITH IOL INSERTION 6.57 00:36.1 (Left: Eye)  Patient location during evaluation: PACU Anesthesia Type: MAC Level of consciousness: awake and alert, oriented and patient cooperative Pain management: pain level controlled Vital Signs Assessment: post-procedure vital signs reviewed and stable Respiratory status: spontaneous breathing, nonlabored ventilation and respiratory function stable Cardiovascular status: blood pressure returned to baseline and stable Postop Assessment: adequate PO intake Anesthetic complications: no   No notable events documented.   Last Vitals:  Vitals:   06/07/24 1128 06/07/24 1133  BP: 134/80 136/66  Pulse: 88 79  Resp: 11 14  Temp:    SpO2: 98% 93%    Last Pain:  Vitals:   06/07/24 1133  TempSrc:   PainSc: 0-No pain                 Alfonso Ruths

## 2024-06-07 NOTE — Op Note (Signed)
 PREOPERATIVE DIAGNOSIS:  Nuclear sclerotic cataract of the left eye.   POSTOPERATIVE DIAGNOSIS:  Nuclear sclerotic cataract of the left eye.   OPERATIVE PROCEDURE:ORPROCALL@   SURGEON:  Elsie Carmine, MD.   ANESTHESIA:  Anesthesiologist: Shellie Odor, MD CRNA: Veronica Alm BROCKS, CRNA  1.      Managed anesthesia care. 2.     0.53ml of Shugarcaine was instilled following the paracentesis   COMPLICATIONS:  None.   TECHNIQUE:   Stop and chop   DESCRIPTION OF PROCEDURE:  The patient was examined and consented in the preoperative holding area where the aforementioned topical anesthesia was applied to the left eye and then brought back to the Operating Room where the left eye was prepped and draped in the usual sterile ophthalmic fashion and a lid speculum was placed. A paracentesis was created with the side port blade and the anterior chamber was filled with viscoelastic. A near clear corneal incision was performed with the steel keratome. A continuous curvilinear capsulorrhexis was performed with a cystotome followed by the capsulorrhexis forceps. Hydrodissection and hydrodelineation were carried out with BSS on a blunt cannula. The lens was removed in a stop and chop  technique and the remaining cortical material was removed with the irrigation-aspiration handpiece. The capsular bag was inflated with viscoelastic and the Technis ZCB00 lens was placed in the capsular bag without complication. The remaining viscoelastic was removed from the eye with the irrigation-aspiration handpiece. The wounds were hydrated. The anterior chamber was flushed with BSS and the eye was inflated to physiologic pressure. 0.39ml Vigamox  was placed in the anterior chamber. The wounds were found to be water tight. The eye was dressed with Combigan . The patient was given protective glasses to wear throughout the day and a shield with which to sleep tonight. The patient was also given drops with which to begin a drop regimen  today and will follow-up with me in one day. Implant Name Type Inv. Item Serial No. Manufacturer Lot No. LRB No. Used Action  LENS IOL TECNIS EYHANCE 22.0 - [id number] Intraocular Lens LENS IOL TECNIS EYHANCE 22.0 [id number] [employer]  Left 1 Implanted    Procedure(s): PHACOEMULSIFICATION, CATARACT, WITH IOL INSERTION 6.57 00:36.1 (Left)  Electronically signed: Elsie Carmine 06/07/2024 11:25 AM

## 2024-06-08 ENCOUNTER — Encounter: Payer: Self-pay | Admitting: Ophthalmology

## 2024-06-08 NOTE — Anesthesia Preprocedure Evaluation (Addendum)
 Anesthesia Evaluation  Patient identified by MRN, date of birth, ID band Patient awake    Reviewed: Allergy & Precautions, H&P , NPO status , Patient's Chart, lab work & pertinent test results  History of Anesthesia Complications (+) PONV and history of anesthetic complications  Airway Mallampati: III  TM Distance: >3 FB Neck ROM: Full    Dental no notable dental hx.    Pulmonary shortness of breath, asthma , COPD, former smoker   Pulmonary exam normal breath sounds clear to auscultation       Cardiovascular hypertension, Normal cardiovascular exam Rhythm:Regular Rate:Normal     Neuro/Psych  Headaches  Neuromuscular disease negative neurological ROS  negative psych ROS   GI/Hepatic negative GI ROS, Neg liver ROS,GERD  ,,  Endo/Other  diabetes    Renal/GU negative Renal ROS  negative genitourinary   Musculoskeletal negative musculoskeletal ROS (+) Arthritis ,    Abdominal   Peds negative pediatric ROS (+)  Hematology negative hematology ROS (+) Blood dyscrasia, anemia   Anesthesia Other Findings Previous cataract surgery 06-07-24 Dr. Mazzoni   tHyperlipidemia  Hypertension  Osteopenia  Asthma Diabetes mellitus  COPD (chronic obstructive pulmonary disease) (HCC) GERD (gastroesophageal reflux disease)  Anemia Complication of anesthesia  PONV (postoperative nausea and vomiting) Dyspnea  Headache(784.0) Osteoarthritis     Reproductive/Obstetrics negative OB ROS                              Anesthesia Physical Anesthesia Plan  ASA: 3  Anesthesia Plan: MAC   Post-op Pain Management:    Induction: Intravenous  PONV Risk Score and Plan:   Airway Management Planned: Natural Airway and Nasal Cannula  Additional Equipment:   Intra-op Plan:   Post-operative Plan:   Informed Consent: I have reviewed the patients History and Physical, chart, labs and discussed the  procedure including the risks, benefits and alternatives for the proposed anesthesia with the patient or authorized representative who has indicated his/her understanding and acceptance.     Dental Advisory Given  Plan Discussed with: Anesthesiologist, CRNA and Surgeon  Anesthesia Plan Comments: (Patient consented for risks of anesthesia including but not limited to:  - adverse reactions to medications - damage to eyes, teeth, lips or other oral mucosa - nerve damage due to positioning  - sore throat or hoarseness - Damage to heart, brain, nerves, lungs, other parts of body or loss of life  Patient voiced understanding and assent.)         Anesthesia Quick Evaluation

## 2024-06-09 ENCOUNTER — Encounter: Payer: Self-pay | Admitting: Family Medicine

## 2024-06-09 ENCOUNTER — Ambulatory Visit (INDEPENDENT_AMBULATORY_CARE_PROVIDER_SITE_OTHER): Admitting: Family Medicine

## 2024-06-09 VITALS — BP 129/76 | HR 68 | Temp 97.8°F | Resp 18 | Ht 65.0 in | Wt 167.0 lb

## 2024-06-09 DIAGNOSIS — E1140 Type 2 diabetes mellitus with diabetic neuropathy, unspecified: Secondary | ICD-10-CM

## 2024-06-09 DIAGNOSIS — E782 Mixed hyperlipidemia: Secondary | ICD-10-CM

## 2024-06-09 DIAGNOSIS — E119 Type 2 diabetes mellitus without complications: Secondary | ICD-10-CM | POA: Diagnosis not present

## 2024-06-09 DIAGNOSIS — I10 Essential (primary) hypertension: Secondary | ICD-10-CM | POA: Diagnosis not present

## 2024-06-09 DIAGNOSIS — M5416 Radiculopathy, lumbar region: Secondary | ICD-10-CM

## 2024-06-09 DIAGNOSIS — M170 Bilateral primary osteoarthritis of knee: Secondary | ICD-10-CM

## 2024-06-09 DIAGNOSIS — M810 Age-related osteoporosis without current pathological fracture: Secondary | ICD-10-CM

## 2024-06-09 DIAGNOSIS — E875 Hyperkalemia: Secondary | ICD-10-CM

## 2024-06-09 DIAGNOSIS — R110 Nausea: Secondary | ICD-10-CM

## 2024-06-09 LAB — POCT GLYCOSYLATED HEMOGLOBIN (HGB A1C): Hemoglobin A1C: 7.6 % — AB (ref 4.0–5.6)

## 2024-06-09 MED ORDER — ONDANSETRON 4 MG PO TBDP: 4.0000 mg | ORAL_TABLET | Freq: Three times a day (TID) | ORAL | 2 refills | Status: DC | PRN

## 2024-06-09 MED ORDER — RYBELSUS 14 MG PO TABS: 1.0000 | ORAL_TABLET | Freq: Every morning | ORAL | 2 refills | Status: DC

## 2024-06-09 MED ORDER — TRAMADOL HCL 50 MG PO TABS
50.0000 mg | ORAL_TABLET | Freq: Two times a day (BID) | ORAL | 0 refills | Status: AC | PRN
Start: 2024-06-09 — End: ?

## 2024-06-09 MED ORDER — MELOXICAM 15 MG PO TABS
ORAL_TABLET | ORAL | 1 refills | Status: AC
Start: 2024-06-09 — End: ?

## 2024-06-09 MED ORDER — METFORMIN HCL ER 500 MG PO TB24: 2000.0000 mg | ORAL_TABLET | Freq: Every day | ORAL | 3 refills | Status: DC

## 2024-06-09 NOTE — Assessment & Plan Note (Signed)
 She is on Zocor  40 mg daily.  Will check lipid profile.  Goal is LDL less than or equal to 70.

## 2024-06-09 NOTE — Assessment & Plan Note (Signed)
 She is taking losartan  25 mg for renal protection from diabetes.  She has mild hyperkalemia.  Will check her potassium today.

## 2024-06-09 NOTE — Assessment & Plan Note (Signed)
 She is on glipizide  5 mg twice daily, metformin  XR 2000 mg daily, Actos  15 mg and Rybelsus  14 mg daily.  A1c today is 7.4 percent.  This is adequate control for 73 year old.  She reports no episodes of hypoglycemia or diaphoresis, confusion, dizziness, headache.

## 2024-06-09 NOTE — Assessment & Plan Note (Signed)
 We are trying to get her a DEXA scan.  Order was sent to [facility].  You should be able to call the radiology department and schedule yourself.

## 2024-06-09 NOTE — Progress Notes (Signed)
 Established Patient Office Visit  Subjective   Patient ID: Patricia Travis, female    DOB: 09-02-1951  Age: 73 y.o. MRN: [id number]  Chief Complaint  Patient presents with   Medical Management of Chronic Issues    HPI Patricia Travis 73 yo woman with advanced OA right knee, DMT2, HTN, lumbar DDD, mixed hyperlipidemia, peripheral neuropathy, hyperkalemia.   Discussed the use of AI scribe software for clinical note transcription with the patient, who gave verbal consent to proceed.  History of Present Illness   Patricia Travis is a 73 year old female who presents for follow-up after cataract surgery and management of knee pain and medication side effects.  She recently underwent cataract surgery on one eye and is scheduled for the other eye next Tuesday. Recovery is progressing well, although she is using a significant amount of eye drops.  She received a knee injection from the orthopedist, and she reports that the relief from the injection typically lasts about five and a half months. The pain typically returns after this period, and she gets another injection.    Her blood sugar levels have been stable, with a reading of 93 on the day of her cataract surgery and 115 today. She does not check her blood sugar daily due to bruising but hopes her A1c reflects these good numbers. She mentions consuming more popsicles due to the hot weather but has switched to ice cubes since her surgery.  She has been taking meloxicam  7.5 mg daily in the evening for pain control. However, she experienced significant swelling in her feet, making it difficult to wear shoes. She reverted to a 15 mg dose, and over the last couple of days, the swelling in her feet has gone down and she can move her toes. She reports some nausea but no ulcers and ensures she eats when taking meloxicam .  She mentions a corn on her foot that causes pain. She plans to contact her podiatrist for further management.  She is trying  to keep her air conditioning usage low due to cost, using fans instead.       ROS    Objective:     BP 129/76 (BP Location: Left Arm, Patient Position: Sitting, Cuff Size: Normal)   Pulse 68   Temp 97.8 F (36.6 C) (Oral)   Resp 18   Ht 5' 5 (1.651 m)   Wt 167 lb (75.8 kg)   SpO2 95%   BMI 27.79 kg/m    Physical Exam Vitals and nursing note reviewed.  Constitutional:      Appearance: Normal appearance.  HENT:     Head: Normocephalic and atraumatic.  Eyes:     Conjunctiva/sclera: Conjunctivae normal.  Cardiovascular:     Rate and Rhythm: Normal rate and regular rhythm.  Pulmonary:     Effort: Pulmonary effort is normal.     Breath sounds: Normal breath sounds.  Musculoskeletal:     Right lower leg: No edema.     Left lower leg: No edema.  Feet:     Comments: 1-105mm callus over fifth metatarsal head left foot.   Skin:    General: Skin is warm and dry.  Neurological:     Mental Status: She is alert and oriented to person, place, and time.  Psychiatric:        Mood and Affect: Mood normal.        Behavior: Behavior normal.        Thought Content: Thought content normal.  Judgment: Judgment normal.          Results for orders placed or performed in visit on 06/09/24  POCT glycosylated hemoglobin (Hb A1C)  Result Value Ref Range   Hemoglobin A1C 7.6 (A) 4.0 - 5.6 %   HbA1c POC (<> result, manual entry)     HbA1c, POC (prediabetic range)     HbA1c, POC (controlled diabetic range)        The 10-year ASCVD risk score (Arnett DK, et al., 2019) is: 32.8%    Assessment & Plan:  Primary hypertension -     CBC with Differential/Platelet  Type 2 diabetes mellitus without complication, without long-term current use of insulin  (HCC) -     metFORMIN  HCl ER; Take 4 tablets (2,000 mg total) by mouth daily.  Dispense: 360 tablet; Refill: 3 -     Rybelsus ; Take 1 tablet (14 mg total) by mouth every morning.  Dispense: 90 tablet; Refill: 2 -     POCT  glycosylated hemoglobin (Hb A1C) -     Lipid panel -     Comprehensive metabolic panel with GFR -     Microalbumin / creatinine urine ratio  Primary osteoarthritis of both knees Assessment & Plan: She is currently taking meloxicam  7.5 mg and reports inadequate pain control.  She also believes the swelling in her feet improves when she is on meloxicam  15 mg daily.  Encouraged her to always take this with food and only take it when she has severe pain.  She also has tramadol  50 mg and she takes 1 of these at night to help her rest.  Orders: -     Meloxicam ; One tab PO every 24 hours with a meal prn pain.  Dispense: 90 tablet; Refill: 1  Nausea -     Ondansetron ; Take 1 tablet (4 mg total) by mouth every 8 (eight) hours as needed for nausea or vomiting.  Dispense: 30 tablet; Refill: 2  Lumbar radiculopathy -     traMADol  HCl; Take 1-2 tablets (50-100 mg total) by mouth every 12 (twelve) hours as needed for moderate pain (pain score 4-6).  Dispense: 90 tablet; Refill: 0  Osteoporosis, postmenopausal -     DG Bone Density; Future  Hyperkalemia Assessment & Plan: She is taking losartan  25 mg for renal protection from diabetes.  She has mild hyperkalemia.  Will check her potassium today.   Mixed hyperlipidemia Assessment & Plan: She is on Zocor  40 mg daily.  Will check lipid profile.  Goal is LDL less than or equal to 70.   Postmenopausal osteoporosis Assessment & Plan: We are trying to get her a DEXA scan.  Order was sent to [facility].  You should be able to call the radiology department and schedule yourself.   Type 2 diabetes mellitus with diabetic neuropathy, without long-term current use of insulin  [facility]) Assessment & Plan: She is on glipizide  5 mg twice daily, metformin  XR 2000 mg daily, Actos  15 mg and Rybelsus  14 mg daily.  A1c today is 7.4 percent.  This is adequate control for 73 year old.  She reports no episodes of hypoglycemia or diaphoresis, confusion,  dizziness, headache.      Return in about 3 months (around 09/09/2024).    Nayleen Janosik K Brok Stocking, MD

## 2024-06-09 NOTE — Assessment & Plan Note (Signed)
 She is currently taking meloxicam  7.5 mg and reports inadequate pain control.  She also believes the swelling in her feet improves when she is on meloxicam  15 mg daily.  Encouraged her to always take this with food and only take it when she has severe pain.  She also has tramadol  50 mg and she takes 1 of these at night to help her rest.

## 2024-06-13 LAB — COMPREHENSIVE METABOLIC PANEL WITH GFR
ALT: 21 IU/L (ref 0–32)
AST: 20 IU/L (ref 0–40)
Albumin: 4.4 g/dL (ref 3.8–4.8)
Alkaline Phosphatase: 95 IU/L (ref 44–121)
BUN/Creatinine Ratio: 20 (ref 12–28)
BUN: 14 mg/dL (ref 8–27)
Bilirubin Total: <0.2 mg/dL (ref 0.0–1.2)
CO2: 21 mmol/L (ref 20–29)
Calcium: 9.4 mg/dL (ref 8.7–10.3)
Chloride: 102 mmol/L (ref 96–106)
Creatinine, Ser: 0.71 mg/dL (ref 0.57–1.00)
Globulin, Total: 2.7 g/dL (ref 1.5–4.5)
Glucose: 131 mg/dL — ABNORMAL HIGH (ref 70–99)
Potassium: 5.3 mmol/L — ABNORMAL HIGH (ref 3.5–5.2)
Sodium: 141 mmol/L (ref 134–144)
Total Protein: 7.1 g/dL (ref 6.0–8.5)
eGFR: 89 mL/min/1.73 (ref >59)

## 2024-06-13 LAB — CBC WITH DIFFERENTIAL/PLATELET
Basophils Absolute: 0.1 x10E3/uL (ref 0.0–0.2)
Basos: 1 %
EOS (ABSOLUTE): 0.3 x10E3/uL (ref 0.0–0.4)
Eos: 3 %
Hematocrit: 41.2 % (ref 34.0–46.6)
Hemoglobin: 12.8 g/dL (ref 11.1–15.9)
Immature Grans (Abs): 0 x10E3/uL (ref 0.0–0.1)
Immature Granulocytes: 0 %
Lymphocytes Absolute: 1.9 x10E3/uL (ref 0.7–3.1)
Lymphs: 21 %
MCH: 29.5 pg (ref 26.6–33.0)
MCHC: 31.1 g/dL — ABNORMAL LOW (ref 31.5–35.7)
MCV: 95 fL (ref 79–97)
Monocytes Absolute: 0.4 x10E3/uL (ref 0.1–0.9)
Monocytes: 4 %
Neutrophils Absolute: 6.4 x10E3/uL (ref 1.4–7.0)
Neutrophils: 71 %
Platelets: 238 x10E3/uL (ref 150–450)
RBC: 4.34 x10E6/uL (ref 3.77–5.28)
RDW: 12.2 % (ref 11.7–15.4)
WBC: 9.1 x10E3/uL (ref 3.4–10.8)

## 2024-06-13 LAB — MICROALBUMIN / CREATININE URINE RATIO

## 2024-06-13 LAB — LIPID PANEL
Chol/HDL Ratio: 2.9 ratio (ref 0.0–4.4)
Cholesterol, Total: 131 mg/dL (ref 100–199)
HDL: 45 mg/dL (ref >39)
LDL Chol Calc (NIH): 63 mg/dL (ref 0–99)
Triglycerides: 131 mg/dL (ref 0–149)
VLDL Cholesterol Cal: 23 mg/dL (ref 5–40)

## 2024-06-13 NOTE — Discharge Instructions (Signed)

## 2024-06-14 ENCOUNTER — Ambulatory Visit: Payer: Self-pay | Admitting: Anesthesiology

## 2024-06-14 ENCOUNTER — Encounter
Admission: RE | Disposition: A | Discharge status: Home / Self Care | Payer: Self-pay | Source: Home / Self Care | Attending: Ophthalmology

## 2024-06-14 ENCOUNTER — Ambulatory Visit
Admission: RE | Admit: 2024-06-14 | Discharge: 2024-06-14 | Disposition: A | Discharge status: Home / Self Care | Attending: Ophthalmology | Admitting: Ophthalmology

## 2024-06-14 ENCOUNTER — Other Ambulatory Visit: Payer: Self-pay

## 2024-06-14 ENCOUNTER — Ambulatory Visit: Payer: Self-pay | Admitting: Family Medicine

## 2024-06-14 ENCOUNTER — Encounter: Payer: Self-pay | Admitting: Ophthalmology

## 2024-06-14 DIAGNOSIS — Z87891 Personal history of nicotine dependence: Secondary | ICD-10-CM | POA: Diagnosis not present

## 2024-06-14 DIAGNOSIS — J4489 Other specified chronic obstructive pulmonary disease: Secondary | ICD-10-CM | POA: Diagnosis not present

## 2024-06-14 DIAGNOSIS — Z7984 Long term (current) use of oral hypoglycemic drugs: Secondary | ICD-10-CM | POA: Insufficient documentation

## 2024-06-14 DIAGNOSIS — H2511 Age-related nuclear cataract, right eye: Secondary | ICD-10-CM | POA: Diagnosis present

## 2024-06-14 DIAGNOSIS — E1136 Type 2 diabetes mellitus with diabetic cataract: Secondary | ICD-10-CM | POA: Insufficient documentation

## 2024-06-14 DIAGNOSIS — I10 Essential (primary) hypertension: Secondary | ICD-10-CM | POA: Diagnosis not present

## 2024-06-14 HISTORY — PX: CATARACT EXTRACTION W/PHACO: SHX586

## 2024-06-14 LAB — GLUCOSE, CAPILLARY: Glucose-Capillary: 161 mg/dL — ABNORMAL HIGH (ref 70–99)

## 2024-06-14 SURGERY — PHACOEMULSIFICATION, CATARACT, WITH IOL INSERTION
Anesthesia: Monitor Anesthesia Care | Site: Eye | Laterality: Right

## 2024-06-14 MED ORDER — ARMC OPHTHALMIC DILATING DROPS
OPHTHALMIC | Status: AC
  Filled 2024-06-14: qty 0.5

## 2024-06-14 MED ORDER — MIDAZOLAM HCL 2 MG/2ML IJ SOLN
INTRAMUSCULAR | Status: DC | PRN
  Administered 2024-06-14 (×2): 1 mg via INTRAVENOUS

## 2024-06-14 MED ORDER — SIGHTPATH DOSE#1 NA CHONDROIT SULF-NA HYALURON 40-17 MG/ML IO SOLN
INTRAOCULAR | Status: DC | PRN
  Administered 2024-06-14: 1 mL via INTRAOCULAR

## 2024-06-14 MED ORDER — ONDANSETRON HCL 4 MG/2ML IJ SOLN
4.0000 mg | Freq: Once | INTRAMUSCULAR | Status: AC
  Administered 2024-06-14: 4 mg via INTRAVENOUS

## 2024-06-14 MED ORDER — TETRACAINE HCL 0.5 % OP SOLN
OPHTHALMIC | Status: AC
  Filled 2024-06-14: qty 4

## 2024-06-14 MED ORDER — MOXIFLOXACIN HCL 0.5 % OP SOLN
OPHTHALMIC | Status: DC | PRN
  Administered 2024-06-14: .2 mL via OPHTHALMIC

## 2024-06-14 MED ORDER — TETRACAINE HCL 0.5 % OP SOLN
1.0000 [drp] | OPHTHALMIC | Status: DC | PRN
  Administered 2024-06-14 (×3): 1 [drp] via OPHTHALMIC

## 2024-06-14 MED ORDER — LACTATED RINGERS IV SOLN: INTRAVENOUS | Status: DC

## 2024-06-14 MED ORDER — MIDAZOLAM HCL 2 MG/2ML IJ SOLN
INTRAMUSCULAR | Status: AC
Start: 2024-06-14 — End: 2024-06-14
  Filled 2024-06-14: qty 2

## 2024-06-14 MED ORDER — SIGHTPATH DOSE#1 BSS IO SOLN
INTRAOCULAR | Status: DC | PRN
  Administered 2024-06-14: 15 mL via INTRAOCULAR

## 2024-06-14 MED ORDER — LIDOCAINE HCL (PF) 2 % IJ SOLN
INTRAOCULAR | Status: DC | PRN
  Administered 2024-06-14: 2 mL

## 2024-06-14 MED ORDER — BRIMONIDINE TARTRATE-TIMOLOL 0.2-0.5 % OP SOLN
OPHTHALMIC | Status: DC | PRN
  Administered 2024-06-14: 1 [drp] via OPHTHALMIC

## 2024-06-14 MED ORDER — ONDANSETRON HCL 4 MG/2ML IJ SOLN
INTRAMUSCULAR | Status: AC
  Filled 2024-06-14: qty 2

## 2024-06-14 MED ORDER — FENTANYL CITRATE (PF) 100 MCG/2ML IJ SOLN
INTRAMUSCULAR | Status: AC
  Filled 2024-06-14: qty 2

## 2024-06-14 MED ORDER — SIGHTPATH DOSE#1 BSS IO SOLN
INTRAOCULAR | Status: DC | PRN
  Administered 2024-06-14: 65 mL via OPHTHALMIC

## 2024-06-14 MED ORDER — ARMC OPHTHALMIC DILATING DROPS
1.0000 | OPHTHALMIC | Status: DC | PRN
  Administered 2024-06-14 (×3): 1 via OPHTHALMIC

## 2024-06-14 MED ORDER — FENTANYL CITRATE (PF) 100 MCG/2ML IJ SOLN
INTRAMUSCULAR | Status: DC | PRN
  Administered 2024-06-14: 50 ug via INTRAVENOUS

## 2024-06-14 SURGICAL SUPPLY — 10 items
CATARACT SUITE SIGHTPATH (MISCELLANEOUS) ×1 IMPLANT
CYSTOTOME ANGL RVRS SHRT 25G (CUTTER) ×1 IMPLANT
FEE CATARACT SUITE SIGHTPATH (MISCELLANEOUS) ×1 IMPLANT
GLOVE BIOGEL PI IND STRL 8 (GLOVE) ×1 IMPLANT
GLOVE SURG LX STRL 8.0 MICRO (GLOVE) ×1 IMPLANT
GLOVE SURG SYN 6.5 PF PI BL (GLOVE) ×1 IMPLANT
LENS IOL TECNIS EYHANCE 22.0 (Intraocular Lens) IMPLANT
NDL FILTER BLUNT 18X1 1/2 (NEEDLE) ×1 IMPLANT
NEEDLE FILTER BLUNT 18X1 1/2 (NEEDLE) ×1 IMPLANT
SYR 3ML LL SCALE MARK (SYRINGE) ×1 IMPLANT

## 2024-06-14 NOTE — H&P (Signed)
 [facility]   Primary Care Physician:  Ziglar, Devere POUR, MD Ophthalmologist: Dr. Jaye  Pre-Procedure History & Physical: HPI:  Patricia Travis is a 73 y.o. female here for cataract surgery.   Past Medical History:  Diagnosis Date   Anemia    hx- high school   Asthma    Complication of anesthesia    problem being combative awakening from  hysterectomy   79 but had total knee 08 and did fine   COPD (chronic obstructive pulmonary disease) (HCC)    Diabetes mellitus    Dyspnea    GERD (gastroesophageal reflux disease)    occ   Headache(784.0)    migraines hx - couple/month   Hyperlipidemia    Hypertension    Osteoarthritis    fingers, hands   Osteopenia    PONV (postoperative nausea and vomiting)     Past Surgical History:  Procedure Laterality Date   ABDOMINAL HYSTERECTOMY     total   CATARACT EXTRACTION W/PHACO Left 06/07/2024   Procedure: PHACOEMULSIFICATION, CATARACT, WITH IOL INSERTION 6.57 00:36.1;  Surgeon: Jaye Fallow, MD;  Location: [facility] ORS;  Service: Ophthalmology;  Laterality: Left;   CHOLECYSTECTOMY  08/16/2012   Procedure: LAPAROSCOPIC CHOLECYSTECTOMY WITH INTRAOPERATIVE CHOLANGIOGRAM;  Surgeon: Donnice POUR. Belinda, MD;  Location: [facility] OR;  Service: General;  Laterality: N/A;   KNEE ARTHROSCOPY  1990   L   KNEE CARTILAGE SURGERY  1992   L   OOPHORECTOMY     TOTAL KNEE ARTHROPLASTY Left 06/01/2007   TOTAL KNEE REVISION Left 08/13/2021   Procedure: LEFT TOTAL KNEE REVISION;  Surgeon: Vernetta Lonni GRADE, MD;  Location: [facility] OR;  Service: Orthopedics;  Laterality: Left;    Prior to Admission medications   Medication Sig Start Date End Date Taking? Authorizing Provider  albuterol  (VENTOLIN  HFA) 108 (90 Base) MCG/ACT inhaler Inhale 1-2 puffs into the lungs every 6 (six) hours as needed for wheezing or shortness of breath.   Yes [provider]  amLODipine  (NORVASC ) 5 MG tablet Take 1 tablet (5 mg total) by mouth daily. 03/07/24  Yes  Ziglar, Susan K, MD  aspirin  81 MG chewable tablet Chew 1 tablet (81 mg total) by mouth 2 (two) times daily. 08/15/21  Yes Vernetta Lonni GRADE, MD  gabapentin  (NEURONTIN ) 100 MG capsule Take 1 capsule (100 mg total) by mouth at bedtime. 03/01/24  Yes Ziglar, Susan K, MD  gabapentin  (NEURONTIN ) 600 MG tablet Take 1 tablet (600 mg total) by mouth at bedtime. 03/07/24  Yes Ziglar, Susan K, MD  glipiZIDE  (GLUCOTROL  XL) 5 MG 24 hr tablet Take 1 tablet (5 mg total) by mouth 2 (two) times daily. 03/07/24  Yes Ziglar, Susan K, MD  losartan  (COZAAR ) 25 MG tablet Take 1 tablet (25 mg total) by mouth daily. 04/11/24  Yes Ziglar, Susan K, MD  meloxicam  (MOBIC ) 15 MG tablet One tab PO every 24 hours with a meal prn pain. 06/09/24  Yes Ziglar, Susan K, MD  metFORMIN  (GLUCOPHAGE -XR) 500 MG 24 hr tablet Take 4 tablets (2,000 mg total) by mouth daily. 06/09/24  Yes Ziglar, Susan K, MD  ondansetron  (ZOFRAN  ODT) 4 MG disintegrating tablet Take 1 tablet (4 mg total) by mouth every 8 (eight) hours as needed for nausea or vomiting. 06/09/24  Yes Ziglar, Susan K, MD  pioglitazone  (ACTOS ) 15 MG tablet Take 1 tablet (15 mg total) by mouth daily. 03/07/24  Yes Ziglar, Susan K, MD  RYBELSUS  14 MG TABS Take 1 tablet (14 mg total) by mouth  every morning. 06/09/24  Yes Ziglar, Susan K, MD  simvastatin  (ZOCOR ) 40 MG tablet Take 1 tablet (40 mg total) by mouth every evening. 03/07/24  Yes Ziglar, Susan K, MD  traMADol  (ULTRAM ) 50 MG tablet Take 1-2 tablets (50-100 mg total) by mouth every 12 (twelve) hours as needed for moderate pain (pain score 4-6). 06/09/24  Yes Ziglar, Susan K, MD  Blood Glucose Monitoring Suppl (ONETOUCH VERIO) w/Device KIT Use as instructed to check blood sugars 05/09/22   [place], MD  glucose blood (ONETOUCH VERIO) test strip 1 each by Other route daily. 04/02/22   Shamleffer, Ibtehal Jaralla, MD  Lancets [facility] ULTRASOFT) lancets Use as instructed 1 daily 03/24/22   Kassie Mallick, MD    Allergies as of  05/10/2024 - Review Complete 03/28/2024  Allergen Reaction Noted   Penicillins Anaphylaxis 02/03/2007   Ace inhibitors Cough 02/26/2009   Cortisone acetate     Tetracyclines & related Nausea And Vomiting 10/02/2011    Family History  Problem Relation Age of Onset   Heart attack Father    Coronary artery disease Father    Heart disease Father    Arthritis Mother        RA, and osteoarthritis   Cancer Maternal Grandmother        breast   Breast cancer Maternal Grandmother 52   Breast cancer Maternal Aunt 8   Diabetes Sister    Diabetes Other        grandparents   Cancer Paternal Grandfather     Social History   Socioeconomic History   Marital status: Married    Spouse name: Not on file   Number of children: Not on file   Years of education: Not on file   Highest education level: Not on file  Occupational History   Occupation: Civil Service fast streamer: [employer]    Comment: Accounts Payable  Tobacco Use   Smoking status: Former    Current packs/day: 0.00    Average packs/day: 1.5 packs/day for 25.0 years (37.5 ttl pk-yrs)    Types: Cigarettes    Start date: 09/08/1967    Quit date: 09/07/1992    Years since quitting: 31.7   Smokeless tobacco: Never  Vaping Use   Vaping status: Never Used  Substance and Sexual Activity   Alcohol use: No   Drug use: No   Sexual activity: Not on file  Other Topics Concern   Not on file  Social History Narrative   Not on file   Social Drivers of Health   Financial Resource Strain: Not on file  Food Insecurity: Not on file  Transportation Needs: Not on file  Physical Activity: Not on file  Stress: Not on file  Social Connections: Not on file  Intimate Partner Violence: Not on file    Review of Systems: See HPI, otherwise negative ROS  Physical Exam: BP (!) 147/92   Pulse 75   Temp (!) 97.3 F (36.3 C) (Temporal)   Resp 20   Ht 5' 5 (1.651 m)   Wt 76.7 kg   SpO2 96%   BMI 28.12 kg/m  General:   Alert,  cooperative. Head:  Normocephalic and atraumatic. Respiratory:  Normal work of breathing. Cardiovascular:  NAD  Impression/Plan: Patricia Travis is here for cataract surgery.  Risks, benefits, limitations, and alternatives regarding cataract surgery have been reviewed with the patient.  Questions have been answered.  All parties agreeable.   Elsie Carmine, MD  06/14/2024, 10:49 AM

## 2024-06-14 NOTE — Transfer of Care (Signed)
 Immediate Anesthesia Transfer of Care Note  Patient: Patricia Travis  Procedure(s) Performed: PHACOEMULSIFICATION, CATARACT, WITH IOL INSERTION 8.77 00:46.2 (Right: Eye)  Patient Location: PACU  Anesthesia Type: MAC  Level of Consciousness: awake, alert  and patient cooperative  Airway and Oxygen Therapy: Patient Spontanous Breathing and Patient connected to supplemental oxygen  Post-op Assessment: Post-op Vital signs reviewed, Patient's Cardiovascular Status Stable, Respiratory Function Stable, Patent Airway and No signs of Nausea or vomiting  Post-op Vital Signs: Reviewed and stable  Complications: No notable events documented.

## 2024-06-14 NOTE — Op Note (Signed)
 PREOPERATIVE DIAGNOSIS:  Nuclear sclerotic cataract of the right eye.   POSTOPERATIVE DIAGNOSIS:  Right Eye Cataract   OPERATIVE PROCEDURE:ORPROCALL@   SURGEON:  Elsie Carmine, MD.   ANESTHESIA:  Anesthesiologist: Ola Donny BROCKS, MD CRNA: Jahoo, Sonia, CRNA  1.      Managed anesthesia care. 2.      0.48ml of Shugarcaine was instilled in the eye following the paracentesis.   COMPLICATIONS:  None.   TECHNIQUE:   Stop and chop   DESCRIPTION OF PROCEDURE:  The patient was examined and consented in the preoperative holding area where the aforementioned topical anesthesia was applied to the right eye and then brought back to the Operating Room where the right eye was prepped and draped in the usual sterile ophthalmic fashion and a lid speculum was placed. A paracentesis was created with the side port blade and the anterior chamber was filled with viscoelastic. A near clear corneal incision was performed with the steel keratome. A continuous curvilinear capsulorrhexis was performed with a cystotome followed by the capsulorrhexis forceps. Hydrodissection and hydrodelineation were carried out with BSS on a blunt cannula. The lens was removed in a stop and chop  technique and the remaining cortical material was removed with the irrigation-aspiration handpiece. The capsular bag was inflated with viscoelastic and the Technis ZCB00  lens was placed in the capsular bag without complication. The remaining viscoelastic was removed from the eye with the irrigation-aspiration handpiece. The wounds were hydrated. The anterior chamber was flushed with BSS and the eye was inflated to physiologic pressure. 0.78ml of Vigamox  was placed in the anterior chamber. The wounds were found to be water tight. The eye was dressed with Combigan . The patient was given protective glasses to wear throughout the day and a shield with which to sleep tonight. The patient was also given drops with which to begin a drop regimen today  and will follow-up with me in one day. Implant Name Type Inv. Item Serial No. Manufacturer Lot No. LRB No. Used Action  LENS IOL TECNIS EYHANCE 22.0 - [id number] Intraocular Lens LENS IOL TECNIS EYHANCE 22.0 [id number] [employer]  Right 1 Implanted   Procedure(s): PHACOEMULSIFICATION, CATARACT, WITH IOL INSERTION 8.77 00:46.2 (Right)  Electronically signed: Elsie Carmine 06/14/2024 11:10 AM

## 2024-06-14 NOTE — Anesthesia Postprocedure Evaluation (Signed)
 Anesthesia Post Note  Patient: Camelia LITTIE Sauers  Procedure(s) Performed: PHACOEMULSIFICATION, CATARACT, WITH IOL INSERTION 8.77 00:46.2 (Right: Eye)  Patient location during evaluation: PACU Anesthesia Type: MAC Level of consciousness: awake and alert Pain management: pain level controlled Vital Signs Assessment: post-procedure vital signs reviewed and stable Respiratory status: spontaneous breathing, nonlabored ventilation, respiratory function stable and patient connected to nasal cannula oxygen Cardiovascular status: stable and blood pressure returned to baseline Postop Assessment: no apparent nausea or vomiting Anesthetic complications: no   No notable events documented.   Last Vitals:  Vitals:   06/14/24 1110 06/14/24 1116  BP: (!) 120/57 138/70  Pulse: 87 86  Resp: 18 15  Temp: 36.6 C (!) 36.4 C  SpO2: 97% 95%    Last Pain:  Vitals:   06/14/24 1116  TempSrc:   PainSc: 0-No pain                 Jackelyn Illingworth C [place]

## 2024-06-20 ENCOUNTER — Ambulatory Visit
Admission: RE | Admit: 2024-06-20 | Discharge: 2024-06-20 | Disposition: A | Discharge status: Home / Self Care | Source: Ambulatory Visit | Attending: Family Medicine | Admitting: Family Medicine

## 2024-06-20 DIAGNOSIS — M810 Age-related osteoporosis without current pathological fracture: Secondary | ICD-10-CM | POA: Diagnosis present

## 2024-07-09 ENCOUNTER — Other Ambulatory Visit: Payer: Self-pay | Admitting: Family Medicine

## 2024-07-20 ENCOUNTER — Other Ambulatory Visit: Payer: Self-pay | Admitting: Family Medicine

## 2024-07-20 NOTE — Telephone Encounter (Unsigned)
 Copied from CRM [id number]. Topic: Clinical - Medication Refill >> Jul 20, 2024  3:44 PM Sophia H wrote: Medication: losartan  (COZAAR ) 25 MG tablet  Has the patient contacted their pharmacy? Yes, was advised by pharmacy there are no refills.   This is the patient's preferred pharmacy:  [id number] [place], [place] - [address] [place] [address] Phone: [phone] Fax: [phone]  Is this the correct pharmacy for this prescription? Yes If no, delete pharmacy and type the correct one.   Has the prescription been filled recently? Yes  Is the patient out of the medication? Yes  Has the patient been seen for an appointment in the last year OR does the patient have an upcoming appointment? Yes, seen on July 17   Can we respond through MyChart? No, prefers phone call.   Agent: Please be advised that Rx refills may take up to 3 business days. We ask that you follow-up with your pharmacy.

## 2024-07-22 MED ORDER — LOSARTAN POTASSIUM 25 MG PO TABS: 25.0000 mg | ORAL_TABLET | Freq: Every day | ORAL | 0 refills | Status: DC

## 2024-07-26 ENCOUNTER — Ambulatory Visit (INDEPENDENT_AMBULATORY_CARE_PROVIDER_SITE_OTHER)

## 2024-07-26 ENCOUNTER — Encounter: Payer: Self-pay | Admitting: Podiatry

## 2024-07-26 ENCOUNTER — Ambulatory Visit (INDEPENDENT_AMBULATORY_CARE_PROVIDER_SITE_OTHER): Admitting: Podiatry

## 2024-07-26 VITALS — Ht 65.0 in | Wt 169.0 lb

## 2024-07-26 DIAGNOSIS — M19072 Primary osteoarthritis, left ankle and foot: Secondary | ICD-10-CM

## 2024-07-26 DIAGNOSIS — D2372 Other benign neoplasm of skin of left lower limb, including hip: Secondary | ICD-10-CM | POA: Diagnosis not present

## 2024-07-26 DIAGNOSIS — M2041 Other hammer toe(s) (acquired), right foot: Secondary | ICD-10-CM | POA: Diagnosis not present

## 2024-07-26 DIAGNOSIS — M19071 Primary osteoarthritis, right ankle and foot: Secondary | ICD-10-CM

## 2024-07-26 DIAGNOSIS — M2042 Other hammer toe(s) (acquired), left foot: Secondary | ICD-10-CM | POA: Diagnosis not present

## 2024-07-26 NOTE — Progress Notes (Signed)
 Chief Complaint  Patient presents with   Foot Pain    Pt is here due to bilateral foot pain at the top of the foot, states this has been going on for a few months, concern with swelling in the right foot and has a con on the bottom of her left foot that is causing some pain.    Subjective: 73 y.o. female presenting to the office today for complaints of a symptomatic skin lesion to the plantar aspect of the left foot that is very painful and tender with walking.  She also has been experiencing midfoot pain bilateral.  No history of injury.  Gradual onset.   Past Medical History:  Diagnosis Date   Anemia    hx- high school   Asthma    Complication of anesthesia    problem being combative awakening from  hysterectomy   79 but had total knee 08 and did fine   COPD (chronic obstructive pulmonary disease) (HCC)    Diabetes mellitus    Dyspnea    GERD (gastroesophageal reflux disease)    occ   Headache(784.0)    migraines hx - couple/month   Hyperlipidemia    Hypertension    Osteoarthritis    fingers, hands   Osteopenia    PONV (postoperative nausea and vomiting)     Past Surgical History:  Procedure Laterality Date   ABDOMINAL HYSTERECTOMY     total   CATARACT EXTRACTION W/PHACO Left 06/07/2024   Procedure: PHACOEMULSIFICATION, CATARACT, WITH IOL INSERTION 6.57 00:36.1;  Surgeon: Jaye Fallow, MD;  Location: [facility] ORS;  Service: Ophthalmology;  Laterality: Left;   CATARACT EXTRACTION W/PHACO Right 06/14/2024   Procedure: PHACOEMULSIFICATION, CATARACT, WITH IOL INSERTION 8.77 00:46.2;  Surgeon: Jaye Fallow, MD;  Location: [facility];  Service: Ophthalmology;  Laterality: Right;   CHOLECYSTECTOMY  08/16/2012   Procedure: LAPAROSCOPIC CHOLECYSTECTOMY WITH INTRAOPERATIVE CHOLANGIOGRAM;  Surgeon: Donnice POUR. Belinda, MD;  Location: [facility] OR;  Service: General;  Laterality: N/A;   KNEE ARTHROSCOPY  1990   L   KNEE CARTILAGE SURGERY  1992   L   OOPHORECTOMY     TOTAL  KNEE ARTHROPLASTY Left 06/01/2007   TOTAL KNEE REVISION Left 08/13/2021   Procedure: LEFT TOTAL KNEE REVISION;  Surgeon: Vernetta Lonni GRADE, MD;  Location: [facility] OR;  Service: Orthopedics;  Laterality: Left;    Allergies  Allergen Reactions   Penicillins Anaphylaxis   Ace Inhibitors Cough   Cortisone Acetate     REACTION: Makes pain worse   Tetracyclines & Related Nausea And Vomiting     Objective:  Physical Exam General: Alert and oriented x3 in no acute distress  Dermatology: Hyperkeratotic lesion(s) present on the plantar aspect of the fifth MTP left. Pain on palpation with a central nucleated core noted. Skin is warm, dry and supple bilateral lower extremities. Negative for open lesions or macerations.  Vascular: Palpable pedal pulses bilaterally. No edema or erythema noted. Capillary refill within normal limits.  Neurological: Grossly intact via light touch  Musculoskeletal Exam: Pain on palpation at the keratotic lesion(s) noted. Range of motion within normal limits bilateral. Muscle strength 5/5 in all groups bilateral.  There is also tenderness with palpation throughout the midfoot bilateral consistent with osteoarthritis  Radiographic exam B/L feet 07/26/2024: Mild degenerative changes noted throughout the midtarsal joint bilateral feet  Assessment: 1.  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  Midfoot arthritis with chronic edema   Plan of Care:  -Patient evaluated -Excisional debridement  of keratoic lesion(s) using a chisel blade was performed without incident.  -Salicylic acid applied with a bandaid -Patient cannot take cortisone injections.  She has side effects -She has tried NSAIDs in the past with no improvement -Recommend compression socks.  She has a pair and wears them intermittently -Recommend elevation and ice especially in the evenings -Continue to refrain from going barefoot and wearing good supportive tennis shoes and sneakers -Return to  the clinic PRN.   Thresa EMERSON Sar, DPM [facility]  Dr. Thresa EMERSON Sar, DPM    [address] [place], [place] [address]                Office [phone]  Fax [phone]

## 2024-08-31 ENCOUNTER — Other Ambulatory Visit: Payer: Self-pay | Admitting: Family Medicine

## 2024-08-31 DIAGNOSIS — E119 Type 2 diabetes mellitus without complications: Secondary | ICD-10-CM

## 2024-09-09 ENCOUNTER — Encounter: Payer: Self-pay | Admitting: Family Medicine

## 2024-09-09 ENCOUNTER — Ambulatory Visit: Admitting: Family Medicine

## 2024-09-09 VITALS — BP 134/80 | HR 75 | Temp 98.2°F | Resp 18 | Ht 65.0 in | Wt 169.0 lb

## 2024-09-09 DIAGNOSIS — E782 Mixed hyperlipidemia: Secondary | ICD-10-CM

## 2024-09-09 DIAGNOSIS — G8929 Other chronic pain: Secondary | ICD-10-CM | POA: Insufficient documentation

## 2024-09-09 DIAGNOSIS — I10 Essential (primary) hypertension: Secondary | ICD-10-CM | POA: Diagnosis not present

## 2024-09-09 DIAGNOSIS — Z7984 Long term (current) use of oral hypoglycemic drugs: Secondary | ICD-10-CM

## 2024-09-09 DIAGNOSIS — G43009 Migraine without aura, not intractable, without status migrainosus: Secondary | ICD-10-CM | POA: Insufficient documentation

## 2024-09-09 DIAGNOSIS — G6289 Other specified polyneuropathies: Secondary | ICD-10-CM

## 2024-09-09 DIAGNOSIS — M170 Bilateral primary osteoarthritis of knee: Secondary | ICD-10-CM

## 2024-09-09 DIAGNOSIS — E1169 Type 2 diabetes mellitus with other specified complication: Secondary | ICD-10-CM | POA: Diagnosis not present

## 2024-09-09 DIAGNOSIS — M2040 Other hammer toe(s) (acquired), unspecified foot: Secondary | ICD-10-CM | POA: Insufficient documentation

## 2024-09-09 DIAGNOSIS — E1165 Type 2 diabetes mellitus with hyperglycemia: Secondary | ICD-10-CM | POA: Insufficient documentation

## 2024-09-09 DIAGNOSIS — E875 Hyperkalemia: Secondary | ICD-10-CM

## 2024-09-09 DIAGNOSIS — E559 Vitamin D deficiency, unspecified: Secondary | ICD-10-CM | POA: Insufficient documentation

## 2024-09-09 DIAGNOSIS — R110 Nausea: Secondary | ICD-10-CM

## 2024-09-09 DIAGNOSIS — M818 Other osteoporosis without current pathological fracture: Secondary | ICD-10-CM

## 2024-09-09 DIAGNOSIS — E119 Type 2 diabetes mellitus without complications: Secondary | ICD-10-CM | POA: Diagnosis not present

## 2024-09-09 DIAGNOSIS — M5416 Radiculopathy, lumbar region: Secondary | ICD-10-CM

## 2024-09-09 DIAGNOSIS — B070 Plantar wart: Secondary | ICD-10-CM | POA: Insufficient documentation

## 2024-09-09 LAB — POCT GLYCOSYLATED HEMOGLOBIN (HGB A1C): Hemoglobin A1C: 7.1 % — AB (ref 4.0–5.6)

## 2024-09-09 MED ORDER — ALBUTEROL SULFATE HFA 108 (90 BASE) MCG/ACT IN AERS: 1.0000 | INHALATION_SPRAY | Freq: Four times a day (QID) | RESPIRATORY_TRACT | 1 refills | Status: AC | PRN

## 2024-09-09 MED ORDER — AMLODIPINE BESYLATE 5 MG PO TABS: 5.0000 mg | ORAL_TABLET | Freq: Every day | ORAL | 1 refills | Status: DC

## 2024-09-09 MED ORDER — LOSARTAN POTASSIUM 25 MG PO TABS: 25.0000 mg | ORAL_TABLET | Freq: Every day | ORAL | 0 refills | Status: AC

## 2024-09-09 MED ORDER — ONETOUCH VERIO VI STRP: 1.0000 | ORAL_STRIP | Freq: Every day | 3 refills | Status: AC

## 2024-09-09 MED ORDER — TRAMADOL HCL 50 MG PO TABS: 50.0000 mg | ORAL_TABLET | Freq: Two times a day (BID) | ORAL | 0 refills | Status: DC | PRN

## 2024-09-09 MED ORDER — SIMVASTATIN 40 MG PO TABS: 40.0000 mg | ORAL_TABLET | Freq: Every evening | ORAL | 1 refills | Status: DC

## 2024-09-09 MED ORDER — GABAPENTIN 600 MG PO TABS: 600.0000 mg | ORAL_TABLET | Freq: Every day | ORAL | 1 refills | Status: DC

## 2024-09-09 MED ORDER — RYBELSUS 14 MG PO TABS: 1.0000 | ORAL_TABLET | Freq: Every morning | ORAL | 2 refills | Status: DC

## 2024-09-09 MED ORDER — PIOGLITAZONE HCL 15 MG PO TABS: 15.0000 mg | ORAL_TABLET | Freq: Every day | ORAL | 1 refills | Status: DC

## 2024-09-09 MED ORDER — METFORMIN HCL ER 500 MG PO TB24: 2000.0000 mg | ORAL_TABLET | Freq: Every day | ORAL | 3 refills | Status: AC

## 2024-09-09 MED ORDER — ONDANSETRON 4 MG PO TBDP: 4.0000 mg | ORAL_TABLET | Freq: Three times a day (TID) | ORAL | 2 refills | Status: DC | PRN

## 2024-09-09 MED ORDER — GABAPENTIN 100 MG PO CAPS: ORAL_CAPSULE | ORAL | 1 refills | Status: DC

## 2024-09-09 MED ORDER — ASPIRIN 81 MG PO CHEW: 81.0000 mg | CHEWABLE_TABLET | Freq: Once | ORAL | 0 refills | Status: AC

## 2024-09-09 MED ORDER — GLIPIZIDE ER 5 MG PO TB24: 5.0000 mg | ORAL_TABLET | Freq: Two times a day (BID) | ORAL | 1 refills | Status: DC

## 2024-09-09 MED ORDER — MELOXICAM 15 MG PO TABS: ORAL_TABLET | ORAL | 1 refills | Status: AC

## 2024-09-09 MED ORDER — ALENDRONATE SODIUM 70 MG PO TABS: 70.0000 mg | ORAL_TABLET | ORAL | 11 refills | Status: DC

## 2024-09-09 NOTE — Assessment & Plan Note (Signed)
 We will check her vitamin D level today.  Ask her to drink milk twice a day to get calcium.  Start Fosamax 70 mg once a week.  Please sit quietly for 30 minutes after taking Fosamax.  Do not eat, drink coffee or take other medications during this time.

## 2024-09-09 NOTE — Assessment & Plan Note (Signed)
 A1c is 7.1% today excellent control.  Please continue metformin  ER 2000 mg daily, Rybelsus  14 mg daily, Actos  15 mg daily, glipizide  5 mg twice daily.  Recheck in 90 days.

## 2024-09-09 NOTE — Progress Notes (Addendum)
 Established Patient Office Visit  Subjective   Patient ID: Patricia Travis, female    DOB: 03/18/1951  Age: 73 y.o. MRN: [id number]  Chief Complaint  Patient presents with   Medical Management of Chronic Issues   Mass    On right forearm and right hip    HPI Delightful 73 year old woman with advanced OA right knee, DMT2, HTN, lumbar DDD, mixed hyperlipidemia, peripheral neuropathy, hyperkalemia and osteoporosis.  Discussed the use of AI scribe software for clinical note transcription with the patient, who gave verbal consent to proceed.  History of Present Illness   Patricia Travis is a 73 year old female with osteoporosis who presents for discussion of bone density treatment options.  Her DEXA scan, 06/20/2024: Lumbar spine T equals 0.3 left total hip T equals -1.9 right femoral neck T equals -2.4.  10-year risk of major osteoporotic fracture is 36.1% and 10-year risk of osteoporotic hip fracture is 23.4%.  She was contacted about starting medication for low bone density. She is currently not taking any osteoporosis medication but is considering options. Her mother also had osteoporosis, indicating a family history of the condition.  She is doing very well with her type 2 diabetes.  Her A1c today is 7.1% which is very good control for her 73 year old.  She takes metformin  ER 500 mg 4 a day, Rybelsus  14 mg daily glipizide  5 mg twice daily, Actos  15 mg daily.  She experiences occasional pain in her eye, which is managed with eye drops. She reports improved vision and brightness since having her cataracts removed.  She is currently taking vitamin D3 once a week and drinks about one glass of milk per day. She does not consume yogurt or cottage cheese.  She is taking meloxicam  15 mg once daily for pain management, having previously tried a lower dose but increased it due to pain. She experiences heartburn frequently, which may affect her ability to take certain osteoporosis  medications.  She is on Rybelsus  for diabetes management and reports her cholesterol levels are well-controlled with medication. She reports that her potassium levels have been slightly high and that she takes losartan .  She reports no issues with her current medications.       ROS    Objective:     BP 134/80 (BP Location: Left Arm, Patient Position: Sitting, Cuff Size: Normal)   Pulse 75   Temp 98.2 F (36.8 C) (Oral)   Resp 18   Ht 5' 5 (1.651 m)   Wt 169 lb (76.7 kg)   SpO2 94%   BMI 28.12 kg/m    Physical Exam Vitals and nursing note reviewed.  Constitutional:      Appearance: Normal appearance.  HENT:     Head: Normocephalic and atraumatic.  Eyes:     Conjunctiva/sclera: Conjunctivae normal.  Cardiovascular:     Rate and Rhythm: Normal rate and regular rhythm.     Pulses:          Dorsalis pedis pulses are 2+ on the right side and 2+ on the left side.  Pulmonary:     Effort: Pulmonary effort is normal.     Breath sounds: Normal breath sounds.  Musculoskeletal:     Right lower leg: No edema.     Left lower leg: No edema.  Feet:     Right foot:     Protective Sensation: 5 sites tested.  5 sites sensed.     Skin integrity: Skin integrity normal.  Toenail Condition: Right toenails are normal.     Left foot:     Protective Sensation: 5 sites tested.  5 sites sensed.     Skin integrity: Skin integrity normal.     Toenail Condition: Left toenails are normal.  Skin:    General: Skin is warm and dry.  Neurological:     Mental Status: She is alert and oriented to person, place, and time.  Psychiatric:        Mood and Affect: Mood normal.        Behavior: Behavior normal.        Thought Content: Thought content normal.        Judgment: Judgment normal.          Results for orders placed or performed in visit on 09/09/24  POCT glycosylated hemoglobin (Hb A1C)  Result Value Ref Range   Hemoglobin A1C 7.1 (A) 4.0 - 5.6 %   HbA1c POC (<> result,  manual entry)     HbA1c, POC (prediabetic range)     HbA1c, POC (controlled diabetic range)        The 10-year ASCVD risk score (Arnett DK, et al., 2019) is: 34.6%    Assessment & Plan:  Vitamin D deficiency Assessment & Plan: Checking her vitamin D level today  Orders: -     VITAMIN D 25 Hydroxy (Vit-D Deficiency, Fractures)  Primary hypertension -     amLODIPine  Besylate; Take 1 tablet (5 mg total) by mouth daily.  Dispense: 90 tablet; Refill: 1  Other polyneuropathy -     Gabapentin ; Take 1 po q 8 hours for migraine headache  Dispense: 90 capsule; Refill: 1  Type 2 diabetes mellitus without complication, without long-term current use of insulin  (HCC) -     glipiZIDE  ER; Take 1 tablet (5 mg total) by mouth 2 (two) times daily.  Dispense: 180 tablet; Refill: 1 -     OneTouch Verio; 1 each by Other route daily.  Dispense: 100 each; Refill: 3 -     OneTouch UltraSoft Lancets; Use as instructed 1 daily  Dispense: 100 each; Refill: 12 -     metFORMIN  HCl ER; Take 4 tablets (2,000 mg total) by mouth daily.  Dispense: 360 tablet; Refill: 3 -     Pioglitazone  HCl; Take 1 tablet (15 mg total) by mouth daily.  Dispense: 90 tablet; Refill: 1 -     Rybelsus ; Take 1 tablet (14 mg total) by mouth every morning.  Dispense: 90 tablet; Refill: 2 -     POCT glycosylated hemoglobin (Hb A1C) -     Microalbumin / creatinine urine ratio  Primary osteoarthritis of both knees -     Meloxicam ; One tab PO every 24 hours with a meal prn pain.  Dispense: 90 tablet; Refill: 1  Nausea -     Ondansetron ; Take 1 tablet (4 mg total) by mouth every 8 (eight) hours as needed for nausea or vomiting.  Dispense: 30 tablet; Refill: 2  Mixed hyperlipidemia -     Simvastatin ; Take 1 tablet (40 mg total) by mouth every evening.  Dispense: 90 tablet; Refill: 1  Lumbar radiculopathy -     traMADol  HCl; Take 1-2 tablets (50-100 mg total) by mouth every 12 (twelve) hours as needed for moderate pain (pain score 4-6).   Dispense: 90 tablet; Refill: 0  Hyperkalemia -     CMP14+EGFR  Other osteoporosis without current pathological fracture Assessment & Plan: We will check her vitamin D level today.  Ask her to drink milk twice a day to get calcium.  Start Fosamax 70 mg once a week.  Please sit quietly for 30 minutes after taking Fosamax.  Do not eat, drink coffee or take other medications during this time.  Orders: -     Alendronate Sodium; Take 1 tablet (70 mg total) by mouth every 7 (seven) days. Take with a full glass of water on an empty stomach.  Dispense: 4 tablet; Refill: 11  Type 2 diabetes mellitus with other specified complication, without long-term current use of insulin  (HCC) Assessment & Plan: A1c is 7.1% today excellent control.  Please continue metformin  ER 2000 mg daily, Rybelsus  14 mg daily, Actos  15 mg daily, glipizide  5 mg twice daily.  Recheck in 90 days.   Other orders -     Albuterol  Sulfate HFA; Inhale 1-2 puffs into the lungs every 6 (six) hours as needed for wheezing or shortness of breath.  Dispense: 90 each; Refill: 1 -     Aspirin ; Chew 1 tablet (81 mg total) by mouth once for 1 dose.  Dispense: 90 tablet; Refill: 0 -     Gabapentin ; Take 1 tablet (600 mg total) by mouth at bedtime.  Dispense: 90 tablet; Refill: 1 -     Losartan  Potassium; Take 1 tablet (25 mg total) by mouth daily.  Dispense: 90 tablet; Refill: 0     Return in about 3 months (around 12/10/2024).    Chyrl Elwell K Corinthian Mizrahi, MD

## 2024-09-09 NOTE — Assessment & Plan Note (Signed)
 Checking her vitamin D level today

## 2024-09-10 LAB — CMP14+EGFR
ALT: 22 IU/L (ref 0–32)
AST: 32 IU/L (ref 0–40)
Albumin: 4.5 g/dL (ref 3.8–4.8)
Alkaline Phosphatase: 81 IU/L (ref 49–135)
BUN/Creatinine Ratio: 13 (ref 12–28)
BUN: 10 mg/dL (ref 8–27)
Bilirubin Total: 0.5 mg/dL (ref 0.0–1.2)
CO2: 23 mmol/L (ref 20–29)
Calcium: 9.5 mg/dL (ref 8.7–10.3)
Chloride: 99 mmol/L (ref 96–106)
Creatinine, Ser: 0.78 mg/dL (ref 0.57–1.00)
Globulin, Total: 2.4 g/dL (ref 1.5–4.5)
Glucose: 165 mg/dL — ABNORMAL HIGH (ref 70–99)
Potassium: 5 mmol/L (ref 3.5–5.2)
Sodium: 143 mmol/L (ref 134–144)
Total Protein: 6.9 g/dL (ref 6.0–8.5)
eGFR: 80 mL/min/1.73 (ref >59)

## 2024-09-10 LAB — VITAMIN D 25 HYDROXY (VIT D DEFICIENCY, FRACTURES): Vit D, 25-Hydroxy: 16.4 ng/mL — ABNORMAL LOW (ref 30.0–100.0)

## 2024-09-10 LAB — MICROALBUMIN / CREATININE URINE RATIO
Creatinine, Urine: 96.6 mg/dL
Microalb/Creat Ratio: 23 mg/g{creat} (ref 0–29)
Microalbumin, Urine: 22 ug/mL

## 2024-09-13 ENCOUNTER — Ambulatory Visit: Payer: Self-pay | Admitting: Family Medicine

## 2024-09-26 ENCOUNTER — Other Ambulatory Visit: Payer: Self-pay

## 2024-09-26 ENCOUNTER — Telehealth: Payer: Self-pay | Admitting: Orthopaedic Surgery

## 2024-09-26 DIAGNOSIS — M1711 Unilateral primary osteoarthritis, right knee: Secondary | ICD-10-CM

## 2024-09-26 NOTE — Telephone Encounter (Signed)
 Pt request to have auth submitted for right knee gel injection

## 2024-10-04 ENCOUNTER — Ambulatory Visit

## 2024-10-24 ENCOUNTER — Ambulatory Visit: Admitting: Physician Assistant

## 2024-10-24 DIAGNOSIS — M1711 Unilateral primary osteoarthritis, right knee: Secondary | ICD-10-CM | POA: Diagnosis not present

## 2024-10-24 MED ORDER — HYALURONAN 88 MG/4ML IX SOSY
88.0000 mg | PREFILLED_SYRINGE | INTRA_ARTICULAR | Status: AC | PRN
  Administered 2024-10-24: 88 mg via INTRA_ARTICULAR

## 2024-10-24 MED ORDER — LIDOCAINE HCL 1 % IJ SOLN
3.0000 mL | INTRAMUSCULAR | Status: AC | PRN
  Administered 2024-10-24: 3 mL

## 2024-10-24 NOTE — Progress Notes (Addendum)
   Procedure Note  Patient: Patricia Travis             Date of Birth: 09/20/1951           MRN: [id number]             Visit Date: 10/24/2024 HPI: [name] comes in today for scheduled viscosupplementation injection right knee.  She was last given Monovisc injection in May of this year and reports good results with this until mid November.  She has had no new injuries.  She is failed other conservative treatment such as NSAIDs.  She is unable to have steroid injections.  She has known osteoarthritis of the right knee.  No scheduled surgery in the next 6 months on the right knee.  Physical exam: General Well-developed well-nourished female no acute distress Right knee: Good range of motion.  Tenderness along the lateral joint line.  No instability valgus varus stressing.  No abnormal warmth erythema or effusion.  Procedures: Visit Diagnoses:  1. Unilateral primary osteoarthritis, right knee     Large Joint Inj: R knee on 10/24/2024 9:14 AM Indications: pain Details: 22 G 1.5 in needle, anterolateral approach  Arthrogram: No  Medications: 3 mL lidocaine  1 %; 88 mg Hyaluronan 88 MG/4ML Outcome: tolerated well, no immediate complications Procedure, treatment alternatives, risks and benefits explained, specific risks discussed. Consent was given by the patient. Immediately prior to procedure a time out was called to verify the correct patient, procedure, equipment, support staff and site/side marked as required. Patient was prepped and draped in the usual sterile fashion.     Plan: She will follow-up with us  as needed she knows to wait at least 6 months between viscosupplementation injections.  She did ask about carpal tunnel syndrome which she is wearing splints for her.  She is advised that she can use Voltaren gel 2 g up to 3 times daily over the median nerve distribution which is discussed and shown to her.  Monovisc Lot #[id number] expiration date 05/12/2027.

## 2024-11-09 ENCOUNTER — Other Ambulatory Visit: Payer: Self-pay | Admitting: Family Medicine

## 2024-11-09 DIAGNOSIS — G6289 Other specified polyneuropathies: Secondary | ICD-10-CM

## 2024-12-01 ENCOUNTER — Other Ambulatory Visit: Payer: Self-pay | Admitting: Family Medicine

## 2024-12-07 ENCOUNTER — Telehealth

## 2024-12-09 ENCOUNTER — Ambulatory Visit: Admitting: Family Medicine

## 2024-12-09 VITALS — BP 127/78 | HR 77 | Temp 97.6°F | Resp 16 | Wt 172.2 lb

## 2024-12-09 DIAGNOSIS — I10 Essential (primary) hypertension: Secondary | ICD-10-CM

## 2024-12-09 DIAGNOSIS — R110 Nausea: Secondary | ICD-10-CM

## 2024-12-09 DIAGNOSIS — E782 Mixed hyperlipidemia: Secondary | ICD-10-CM | POA: Diagnosis not present

## 2024-12-09 DIAGNOSIS — M818 Other osteoporosis without current pathological fracture: Secondary | ICD-10-CM | POA: Diagnosis not present

## 2024-12-09 DIAGNOSIS — M5416 Radiculopathy, lumbar region: Secondary | ICD-10-CM | POA: Diagnosis not present

## 2024-12-09 DIAGNOSIS — M170 Bilateral primary osteoarthritis of knee: Secondary | ICD-10-CM

## 2024-12-09 DIAGNOSIS — Z7984 Long term (current) use of oral hypoglycemic drugs: Secondary | ICD-10-CM

## 2024-12-09 DIAGNOSIS — E119 Type 2 diabetes mellitus without complications: Secondary | ICD-10-CM | POA: Diagnosis not present

## 2024-12-09 DIAGNOSIS — E559 Vitamin D deficiency, unspecified: Secondary | ICD-10-CM | POA: Diagnosis not present

## 2024-12-09 MED ORDER — ALENDRONATE SODIUM 70 MG PO TABS: 70.0000 mg | ORAL_TABLET | ORAL | 11 refills | Status: AC

## 2024-12-09 MED ORDER — GLIPIZIDE ER 5 MG PO TB24: 5.0000 mg | ORAL_TABLET | Freq: Two times a day (BID) | ORAL | 1 refills | Status: AC

## 2024-12-09 MED ORDER — GABAPENTIN 600 MG PO TABS: 600.0000 mg | ORAL_TABLET | Freq: Every day | ORAL | 1 refills | Status: AC

## 2024-12-09 MED ORDER — AMLODIPINE BESYLATE 5 MG PO TABS: 5.0000 mg | ORAL_TABLET | Freq: Every day | ORAL | 1 refills | Status: AC

## 2024-12-09 MED ORDER — PIOGLITAZONE HCL 15 MG PO TABS: 15.0000 mg | ORAL_TABLET | Freq: Every day | ORAL | 1 refills | Status: AC

## 2024-12-09 MED ORDER — TRAMADOL HCL 50 MG PO TABS: 50.0000 mg | ORAL_TABLET | Freq: Two times a day (BID) | ORAL | 0 refills | Status: AC | PRN

## 2024-12-09 MED ORDER — ONDANSETRON 4 MG PO TBDP: 4.0000 mg | ORAL_TABLET | Freq: Three times a day (TID) | ORAL | 2 refills | Status: AC | PRN

## 2024-12-09 MED ORDER — RYBELSUS 14 MG PO TABS: 1.0000 | ORAL_TABLET | Freq: Every morning | ORAL | 2 refills | Status: AC

## 2024-12-09 MED ORDER — SIMVASTATIN 40 MG PO TABS: 40.0000 mg | ORAL_TABLET | Freq: Every evening | ORAL | 1 refills | Status: AC

## 2024-12-10 ENCOUNTER — Telehealth: Payer: Self-pay | Admitting: Family Medicine

## 2024-12-10 LAB — CBC WITH DIFFERENTIAL/PLATELET
Basophils Absolute: 0.1 x10E3/uL (ref 0.0–0.2)
Basos: 1 %
EOS (ABSOLUTE): 0.1 x10E3/uL (ref 0.0–0.4)
Eos: 1 %
Hematocrit: 42.8 % (ref 34.0–46.6)
Hemoglobin: 13.3 g/dL (ref 11.1–15.9)
Immature Grans (Abs): 0 x10E3/uL (ref 0.0–0.1)
Immature Granulocytes: 0 %
Lymphocytes Absolute: 2.1 x10E3/uL (ref 0.7–3.1)
Lymphs: 22 %
MCH: 29.7 pg (ref 26.6–33.0)
MCHC: 31.1 g/dL — ABNORMAL LOW (ref 31.5–35.7)
MCV: 96 fL (ref 79–97)
Monocytes Absolute: 0.5 x10E3/uL (ref 0.1–0.9)
Monocytes: 5 %
Neutrophils Absolute: 6.8 x10E3/uL (ref 1.4–7.0)
Neutrophils: 71 %
Platelets: 263 x10E3/uL (ref 150–450)
RBC: 4.48 x10E6/uL (ref 3.77–5.28)
RDW: 11.9 % (ref 11.7–15.4)
WBC: 9.5 x10E3/uL (ref 3.4–10.8)

## 2024-12-10 LAB — LIPID PANEL
Chol/HDL Ratio: 2.7 ratio (ref 0.0–4.4)
Cholesterol, Total: 142 mg/dL (ref 100–199)
HDL: 53 mg/dL
LDL Chol Calc (NIH): 64 mg/dL (ref 0–99)
Triglycerides: 144 mg/dL (ref 0–149)
VLDL Cholesterol Cal: 25 mg/dL (ref 5–40)

## 2024-12-10 LAB — COMPREHENSIVE METABOLIC PANEL WITH GFR
ALT: 14 IU/L (ref 0–32)
AST: 18 IU/L (ref 0–40)
Albumin: 4.4 g/dL (ref 3.8–4.8)
Alkaline Phosphatase: 78 IU/L (ref 49–135)
BUN/Creatinine Ratio: 23 (ref 12–28)
BUN: 16 mg/dL (ref 8–27)
Bilirubin Total: 0.3 mg/dL (ref 0.0–1.2)
CO2: 21 mmol/L (ref 20–29)
Calcium: 9.2 mg/dL (ref 8.7–10.3)
Chloride: 99 mmol/L (ref 96–106)
Creatinine, Ser: 0.7 mg/dL (ref 0.57–1.00)
Globulin, Total: 2.5 g/dL (ref 1.5–4.5)
Glucose: 209 mg/dL — ABNORMAL HIGH (ref 70–99)
Potassium: 4.5 mmol/L (ref 3.5–5.2)
Sodium: 140 mmol/L (ref 134–144)
Total Protein: 6.9 g/dL (ref 6.0–8.5)
eGFR: 91 mL/min/1.73

## 2024-12-10 LAB — HEMOGLOBIN A1C
Est. average glucose Bld gHb Est-mCnc: 223 mg/dL
Hgb A1c MFr Bld: 9.4 % — ABNORMAL HIGH (ref 4.8–5.6)

## 2024-12-10 LAB — MICROALBUMIN / CREATININE URINE RATIO
Creatinine, Urine: 142.5 mg/dL
Microalb/Creat Ratio: 10 mg/g{creat} (ref 0–29)
Microalbumin, Urine: 14.4 ug/mL

## 2024-12-10 LAB — T4, FREE: Free T4: 1.31 ng/dL (ref 0.82–1.77)

## 2024-12-10 LAB — TSH: TSH: 2.34 u[IU]/mL (ref 0.450–4.500)

## 2024-12-10 LAB — VITAMIN D 25 HYDROXY (VIT D DEFICIENCY, FRACTURES): Vit D, 25-Hydroxy: 33.2 ng/mL (ref 30.0–100.0)

## 2024-12-10 NOTE — Assessment & Plan Note (Signed)
 She is taking Fosamax  correctly but still getting GERD.  Discussed referral to endocrinology where she may trial Prolia or Reclast.

## 2024-12-10 NOTE — Progress Notes (Signed)
 "  Established Patient Office Visit  Subjective   Patient ID: Patricia Travis, female    DOB: 1950-11-25  Age: 74 y.o. MRN: [id number]  Chief Complaint  Patient presents with   Follow-up    Pt.here for a follow-up visit for rt. Forearm and rt. Hip pain. Pt.denies pain at this time.    HPI Discussed the use of AI scribe software for clinical note transcription with the patient, who gave verbal consent to proceed.  History of Present Illness   Patricia Travis is a 74 year old female with type 2 diabetes and osteoporosis who presents for medication management and follow-up.  She takes metformin  ER 500 mg, two tablets in the morning and two at night, for type 2 diabetes. She previously tried regular metformin , which caused significant gastrointestinal upset. She also takes glipizide  ER 5 mg, two tablets daily. Financial constraints affect her ability to afford medications, specifically declining gabapentin  due to cost and expressing concern about the cost of Rybelsus .  She is currently taking Fosamax  for osteoporosis but experiences occasional heartburn as a side effect. She reports that after taking Fosamax , she sits in a chair, drinks eight ounces of water, and waits thirty to forty-five minutes before eating, drinking coffee, or taking other medications. She is also taking vitamin D  5000 IU daily, as her previous vitamin D  level was low at 16 ng/mL.    She takes tramadol , one tablet at night, for pain management and emphasizes the necessity of this medication. She has recently run out and requires a refill. She mentions using a GoodRx card to help manage medication costs.  Her social history includes living on Social Security and experiencing financial difficulties, particularly with increased heating costs during winter months. She lives alone following the passing of her husband a year and a half ago and has family in [place] .  No new or persistent skin lesions.       Objective:      BP 127/78 (Cuff Size: Normal)   Pulse 77   Temp 97.6 F (36.4 C) (Oral)   Resp 16   Wt 172 lb 3.2 oz (78.1 kg)   SpO2 92%   BMI 28.66 kg/m    Physical Exam Vitals and nursing note reviewed.  Constitutional:      Appearance: Normal appearance.  HENT:     Head: Normocephalic and atraumatic.  Eyes:     Conjunctiva/sclera: Conjunctivae normal.  Cardiovascular:     Rate and Rhythm: Normal rate and regular rhythm.  Pulmonary:     Effort: Pulmonary effort is normal.     Breath sounds: Normal breath sounds.  Musculoskeletal:     Right lower leg: No edema.     Left lower leg: No edema.  Skin:    General: Skin is warm and dry.  Neurological:     Mental Status: She is alert and oriented to person, place, and time.  Psychiatric:        Mood and Affect: Mood normal.        Behavior: Behavior normal.        Thought Content: Thought content normal.        Judgment: Judgment normal.          Results for orders placed or performed in visit on 12/09/24  Vitamin D  (25 hydroxy)  Result Value Ref Range   Vit D, 25-Hydroxy 33.2 30.0 - 100.0 ng/mL      The 10-year ASCVD risk score (Arnett DK, et al., 2019) is:  31.7%    Assessment & Plan:  Vitamin D  deficiency disease -     VITAMIN D  25 Hydroxy (Vit-D Deficiency, Fractures)  Primary hypertension -     amLODIPine  Besylate; Take 1 tablet (5 mg total) by mouth daily.  Dispense: 90 tablet; Refill: 1 -     CBC with Differential/Platelet -     Comprehensive metabolic panel with GFR -     Lipid panel -     TSH -     T4, free  Type 2 diabetes mellitus without complication, without long-term current use of insulin  (HCC) -     glipiZIDE  ER; Take 1 tablet (5 mg total) by mouth 2 (two) times daily.  Dispense: 180 tablet; Refill: 1 -     Rybelsus ; Take 1 tablet (14 mg total) by mouth every morning.  Dispense: 90 tablet; Refill: 2 -     Pioglitazone  HCl; Take 1 tablet (15 mg total) by mouth daily.  Dispense: 90 tablet; Refill:  1 -     Microalbumin / creatinine urine ratio -     Hemoglobin A1c  Primary osteoarthritis of both knees -     Gabapentin ; Take 1 tablet (600 mg total) by mouth at bedtime.  Dispense: 90 tablet; Refill: 1  Nausea -     Ondansetron ; Take 1 tablet (4 mg total) by mouth every 8 (eight) hours as needed for nausea or vomiting.  Dispense: 30 tablet; Refill: 2  Lumbar radiculopathy -     traMADol  HCl; Take 1-2 tablets (50-100 mg total) by mouth every 12 (twelve) hours as needed for moderate pain (pain score 4-6).  Dispense: 90 tablet; Refill: 0  Mixed hyperlipidemia -     Simvastatin ; Take 1 tablet (40 mg total) by mouth every evening.  Dispense: 90 tablet; Refill: 1  Other osteoporosis without current pathological fracture Assessment & Plan: She is taking Fosamax  correctly but still getting GERD.  Discussed referral to endocrinology where she may trial Prolia or Reclast.  Orders: -     Alendronate  Sodium; Take 1 tablet (70 mg total) by mouth every 7 (seven) days. Take with a full glass of water on an empty stomach.  Dispense: 4 tablet; Refill: 11 -     Ambulatory referral to Endocrinology  Essential hypertension Assessment & Plan: Good control of her blood pressure today.      No follow-ups on file.    Camren Henthorn K Ashlay Altieri, MD "

## 2024-12-10 NOTE — Telephone Encounter (Signed)
 Her vitamin D  level has come up to 32.  She is taking vitamin D3 5000 international units daily.  Ask her to increase it to 10,000 international units daily for a few months.  She is on Fosamax  and needs adequate vitamin D  and calcium.  She reports that her Farxiga  is going to cost $700 and she cannot afford it.  Discussed the possibility of a patient assistance program.  Will ask pharmacy to help.

## 2024-12-10 NOTE — Assessment & Plan Note (Signed)
 Good control of her blood pressure today.

## 2024-12-12 ENCOUNTER — Other Ambulatory Visit: Payer: Self-pay | Admitting: Family Medicine

## 2024-12-12 ENCOUNTER — Telehealth: Payer: Self-pay | Admitting: Family Medicine

## 2024-12-12 DIAGNOSIS — Z79899 Other long term (current) drug therapy: Secondary | ICD-10-CM

## 2024-12-12 DIAGNOSIS — E119 Type 2 diabetes mellitus without complications: Secondary | ICD-10-CM

## 2024-12-12 NOTE — Telephone Encounter (Signed)
 Copied from CRM [id number]. Topic: Clinical - Prescription Issue >> Dec 09, 2024  3:26 PM Mia F wrote: Reason for CRM: Pt says she cannot afford the RYBELSUS  14 MG TABS. She says without the discount card it is $700 and with it is $1000. She says she cannot afford either cost. She says she will contat her insuance company to see if there is a plan that will cover it .

## 2024-12-12 NOTE — Telephone Encounter (Signed)
 Routing encounter to Dr. Ziglar for review. Please advise on this.

## 2024-12-14 ENCOUNTER — Other Ambulatory Visit: Admitting: Pharmacist

## 2024-12-14 DIAGNOSIS — E1169 Type 2 diabetes mellitus with other specified complication: Secondary | ICD-10-CM

## 2024-12-14 NOTE — Patient Instructions (Signed)
"   Goals Addressed             This Visit's Progress    Pharmacy Goals       Please watch the mail for an envelope from [facility] containing the patient assistance program application. Please complete this application and bring to office to have it faxed back to Attention: Madelaine Saver at Fax # [phone] along with a copy of your Medicare Part D prescription card and a copy of your proof of income document.  The goal A1c is less than 7%. This is the best way to reduce the risk of the long term complications of diabetes, including heart disease, kidney disease, eye disease, strokes, and nerve damage. An A1c of less than 7% corresponds with fasting sugars less than 130 and 2 hour after meal sugars less than 180.   Our goal bad cholesterol, or LDL, is less than 70 . This is why it is important to continue taking your simvastatin .  Thank you!  Sharyle Sia, PharmD, [facility] [phone]         "

## 2024-12-14 NOTE — Progress Notes (Signed)
 "  12/14/2024 Name: Patricia Travis MRN: [id number] DOB: 1951-06-30  Chief Complaint  Patient presents with   Medication Assistance    Patricia Travis is a 74 y.o. year old female who presented for a telephone visit.   They were referred to the pharmacist by their PCP for assistance in managing medication access.    Subjective:  Care Team: Primary Care Provider: Ziglar, Susan K, MD ; Next Scheduled Visit: 03/09/2025   Medication Access/Adherence  Current Pharmacy:  [id number] - [place], [place] - [address] [place] [address] Phone: [phone] Fax: [phone]   Patient reports affordability concerns with their medications: Yes  Patient reports access/transportation concerns to their pharmacy: No  Patient reports adherence concerns with their medications:  No    Reports cost of Rybelsus  is now unaffordable (>$700) through her [employer] prescription insurance plan in 2026  Diabetes:  Current medications:  - glipizide  ER 5 mg twice daily - metformin  ER 500 mg - 4 tablets (2,000 mg) daily - pioglitazone  15 mg daily - Rybelsus  14 mg daily each morning  Medications tried in the past: metformin  IR (GI upset)  Current glucose readings: morning fasting readings ranging 135-150; bedtime: ~140   Patient denies hypoglycemic s/sx including dizziness, shakiness, sweating.   Find that patient is drinking regular ginger ale throughout the day. Also reports currently eating meals high in carbohydrates due to limited financial resources.   Current physical activity: recently limited by cold weather, but planning to start walking indoors  Current medication access support: none  Macrovascular and Microvascular Risk Reduction:  Statin? yes (simvastatin  40 mg); ACEi/ARB? yes (losartan  25 mg) Last urinary albumin/creatinine ratio:  Lab Results  Component Value Date   MICRALBCREAT 10 12/09/2024   MICRALBCREAT 23  09/09/2024   MICRALBCREAT 14 03/01/2024   MICRALBCREAT 5.7 05/08/2009   MICRALBCREAT 5.0 11/03/2008   Last eye exam:  Lab Results  Component Value Date   HMDIABEYEEXA No Retinopathy 09/03/2023   Last foot exam: 05/26/2022 Tobacco Use:  Tobacco Use: Medium Risk (09/09/2024)   Patient History    Smoking Tobacco Use: Former    Smokeless Tobacco Use: Never    Passive Exposure: Not on file     Objective:  Lab Results  Component Value Date   HGBA1C 9.4 (H) 12/09/2024    Lab Results  Component Value Date   CREATININE 0.70 12/09/2024   BUN 16 12/09/2024   NA 140 12/09/2024   K 4.5 12/09/2024   CL 99 12/09/2024   CO2 21 12/09/2024    Lab Results  Component Value Date   CHOL 142 12/09/2024   HDL 53 12/09/2024   LDLCALC 64 12/09/2024   TRIG 144 12/09/2024   CHOLHDL 2.7 12/09/2024   BP Readings from Last 3 Encounters:  12/09/24 127/78  09/09/24 134/80  06/14/24 138/70   Pulse Readings from Last 3 Encounters:  12/09/24 77  09/09/24 75  06/14/24 86     Medications Reviewed Today     Reviewed by Alana Sharyle LABOR, RPH-CPP (Pharmacist) on 12/14/24 at 1318  Med List Status: <None>   Medication Order Taking? Sig Documenting Provider Last Dose Status Informant  albuterol  (VENTOLIN  HFA) 108 (90 Base) MCG/ACT inhaler [id number] Yes Inhale 1-2 puffs into the lungs every 6 (six) hours as needed for wheezing or shortness of breath. Ziglar, Susan K, MD  Active   alendronate  (FOSAMAX ) 70 MG tablet [id number] Yes Take 1 tablet (70 mg total) by mouth every  7 (seven) days. Take with a full glass of water on an empty stomach. Ziglar, Susan K, MD  Active   amLODipine  (NORVASC ) 5 MG tablet [id number] Yes Take 1 tablet (5 mg total) by mouth daily. Ziglar, Susan K, MD  Active   aspirin  (ASPIRIN  LOW DOSE) 81 MG chewable tablet [id number]  CHEW 1 TABLET (81 MG TOTAL) BY MOUTH ONCE FOR 1 DOSE.  Patient not taking: Reported on 12/14/2024   Ziglar, Susan K, MD  Active   Blood Glucose  Monitoring Suppl [facility] VERIO) w/Device PRESSLEY [id number]  Use as instructed to check blood sugars [place], MD  Active   Cholecalciferol 125 MCG (5000 UT) TABS [id number] Yes Take 2 tablets by mouth daily. Notes taking this higher dose as directed by PCP for deficiency [provider]  Active   gabapentin  (NEURONTIN ) 100 MG capsule [id number] Yes TAKE 1 CAPSULE EVERY 8 HOURS FOR MIGRAINE HEADACHE  Patient taking differently: TAKE 1 CAPSULE EVERY 8 HOURS FOR MIGRAINE HEADACHE as needed   Ziglar, Susan K, MD  Active   gabapentin  (NEURONTIN ) 600 MG tablet [id number] Yes Take 1 tablet (600 mg total) by mouth at bedtime. Ziglar, Susan K, MD  Active   glipiZIDE  (GLUCOTROL  XL) 5 MG 24 hr tablet [id number] Yes Take 1 tablet (5 mg total) by mouth 2 (two) times daily. Ziglar, Susan K, MD  Active   glucose blood [facility] VERIO) test strip [id number]  1 each by Other route daily. Ziglar, Susan K, MD  Active   Lancets [facility] ULTRASOFT) lancets [id number]  Use as instructed 1 daily Ziglar, Susan K, MD  Active   losartan  (COZAAR ) 25 MG tablet [id number] Yes Take 1 tablet (25 mg total) by mouth daily. Ziglar, Susan K, MD  Active   meloxicam  (MOBIC ) 15 MG tablet [id number] Yes One tab PO every 24 hours with a meal prn pain. Ziglar, Susan K, MD  Active   metFORMIN  (GLUCOPHAGE -XR) 500 MG 24 hr tablet [id number] Yes Take 4 tablets (2,000 mg total) by mouth daily. Ziglar, Susan K, MD  Active   ondansetron  (ZOFRAN  ODT) 4 MG disintegrating tablet [id number] Yes Take 1 tablet (4 mg total) by mouth every 8 (eight) hours as needed for nausea or vomiting. Ziglar, Susan K, MD  Active   pioglitazone  (ACTOS ) 15 MG tablet [id number] Yes Take 1 tablet (15 mg total) by mouth daily. Ziglar, Susan K, MD  Active   RYBELSUS  14 MG TABS [id number] Yes Take 1 tablet (14 mg total) by mouth every morning. Ziglar, Susan K, MD  Active   simvastatin  (ZOCOR ) 40 MG tablet [id number] Yes Take 1 tablet (40 mg total) by mouth every evening.  Ziglar, Susan K, MD  Active   traMADol  (ULTRAM ) 50 MG tablet [id number] Yes Take 1-2 tablets (50-100 mg total) by mouth every 12 (twelve) hours as needed for moderate pain (pain score 4-6).  Patient taking differently: Take 50-100 mg by mouth every 12 (twelve) hours as needed for moderate pain (pain score 4-6). Reports takes 1 tablet at bedtime as needed   Ziglar, Susan K, MD  Active               Assessment/Plan:   Send referral to Care Guide team as patient requesting resources for financial support related to utilities and food  Review with patient that per [facility] website, the [facility] Silver[employer] Choice [phone] plan has a $615 annual prescription deductible (impacting tiers 3-5), tier 3 copayment of 18% coinsurance (for medications including Farxiga , dapagliflozin  or Jardiance)  and tier 5 copayment of 25% coinsurance (for medications such as Rybelsus , Ozempic, Trulicity, Mounjaro) in 2026. Note out of pocket prescription drug cost in 2026 is capped at $2,100.   Diabetes: - Currently uncontrolled; goal A1c <7%.   - Reviewed long term cardiovascular and renal outcomes of uncontrolled blood sugar.  - Reviewed goal A1c, goal fasting, and goal 2 hour post prandial glucose. - Reviewed dietary modifications including importance of having regular well-balanced meals and snacks throughout the day, while controlling carbohydrate portion sizes             Encourage patient to avoid consumption of sugary beverages (ginger ale)             Encourage patient to increase consumption of non-starchy vegetables  Encourage to review nutrition labels for carbohydrate content of foods - Counsel patient on s/s of low blood sugar and how to treat lows - Patient plans to increase walking inside her home (to avoid the cold weather) - Based on reported income, patient does not meet criteria for Extra Help subsidy - Based on income, patient would meet criteria for patient assistance for Trulicity from  [employer] and Farxiga  from [employer].  Collaborate with PCP via Secure chat. Agreeable to switch patient from Rybelsus  to Trulicity for affordability (patient assistance program availability) and would like to have patient start Farxiga . Advises samples of medications not available in office Will collaborate with PCP and CPhT to assist patient with applying for patient assistance for Trulicity from [employer] and Farxiga  from [employer] - Recommend to monitor home blood sugar, keep log of results and have this record to review at upcoming medical appointments.  Encourage patient to use blood sugar checks as feedback on dietary choices. Patient to contact provider office sooner if needed for readings outside of established parameters or symptoms    Follow Up Plan: Clinical Pharmacist will follow up with patient by telephone on 01/11/2025 at 9:30 AM   Sharyle Sia, PharmD, [facility] [phone]    "

## 2024-12-15 ENCOUNTER — Telehealth: Payer: Self-pay

## 2024-12-15 NOTE — Progress Notes (Signed)
 Complex Care Management Note  Care Guide Note 12/15/2024 Name: ALIZA MORET MRN: [id number] DOB: Jan 20, 1951  Camelia LITTIE Geng is a 74 y.o. year old female who sees Ziglar, Susan K, MD for primary care. I reached out to [employer] by phone today to offer complex care management services.  Ms. Rosman was given information about Complex Care Management services today including:   The Complex Care Management services include support from the care team which includes your Nurse Care Manager, Clinical Social Worker, or Pharmacist.  The Complex Care Management team is here to help remove barriers to the health concerns and goals most important to you. Complex Care Management services are voluntary, and the patient may decline or stop services at any time by request to their care team member.   Complex Care Management Consent Status: Patient agreed to services and verbal consent obtained.   Follow up plan:  Telephone appointment with complex care management team member scheduled for:  [facility] 12/20/2024 BSW 12/21/2024  Encounter Outcome:  Patient Scheduled  Jeoffrey Buffalo , RMA     [place]  [facility], [facility] Guide  Direct Dial: [phone]  Website: [email or web address]

## 2024-12-16 ENCOUNTER — Telehealth: Payer: Self-pay

## 2024-12-16 NOTE — Telephone Encounter (Signed)
 PAP: Patient assistance application for Farxiga  through [employer] ([employer]) has been mailed to pt's home address on file. Provider portion of application will be faxed to provider's office.  PAP: Patient assistance application for Trulicity through [employer] has been mailed to pt's home address on file. Provider portion of application will be faxed to provider's office.  Provider portion has been faxed to Dr. Susan Ziglar

## 2024-12-20 ENCOUNTER — Other Ambulatory Visit: Payer: Self-pay

## 2024-12-20 NOTE — Patient Instructions (Signed)
 Visit Information  Thank you for taking time to visit with me today. Please don't hesitate to contact me if I can be of assistance to you before our next scheduled appointment.  Our next appointment is by telephone on 01/03/25 at 9:30am Please call the care guide team at [phone] if you need to cancel or reschedule your appointment.   Following is a copy of your care plan:   Goals Addressed             This Visit's Progress    VBCI RN Care Plan       Problems:  Chronic Disease Management support and education needs related to DMII Difficulty obtaining medications  Goal: Over the next 90 days the Patient will continue to work with RN Care Manager and/or Social Worker to address care management and care coordination needs related to DMII as evidenced by adherence to care management team scheduled appointments      Interventions:   Diabetes Interventions: Assessed patient's understanding of A1c goal: <7% Provided education to patient about basic DM disease process Reviewed medications with patient and discussed importance of medication adherence Discussed plans with patient for ongoing care management follow up and provided patient with direct contact information for care management team Review of patient status, including review of consultants reports, relevant laboratory and other test results, and medications completed Screening for signs and symptoms of depression related to chronic disease state  Lab Results  Component Value Date   HGBA1C 9.4 (H) 12/09/2024    Patient Self-Care Activities:  Attend all scheduled provider appointments Call provider office for new concerns or questions  Take medications as prescribed   keep appointment with eye doctor check blood sugar at prescribed times: before meals and at bedtime check feet daily for cuts, sores or redness enter blood sugar readings and medication or insulin  into daily log take the blood sugar log to all doctor  visits trim toenails straight across drink 6 to 8 glasses of water each day eat fish at least once per week fill half of plate with vegetables limit fast food meals to no more than 1 per week wash and dry feet carefully every day wear comfortable, well-fitting shoes  Plan:  Follow up with provider re: Dr Ziglar 03/09/25 Telephone follow up appointment with care management team member scheduled for:  01/03/25             Please call the Suicide and Crisis Lifeline: 988 call the [place]  National Suicide Prevention Lifeline: 539-485-6218 or TTY: 343-710-1683 TTY (431) 480-8436) to talk to a trained counselor call 1-800-273-TALK (toll free, 24 hour hotline) if you are experiencing a Mental Health or Behavioral Health Crisis or need someone to talk to.  Patient verbalized understanding of Care plan and visit instructions communicated this visit  Germaine Shenker RN RN Care Manager [facility] [phone]

## 2024-12-20 NOTE — Patient Outreach (Signed)
 Complex Care Management   Visit Note  12/20/2024  Name:  Patricia Travis MRN: [id number] DOB: January 25, 1951  Situation: Referral received for Complex Care Management related to Diabetes with Complications I obtained verbal consent from Patient.  Visit completed with Patient  on the phone  Background:   Past Medical History:  Diagnosis Date   Anemia    hx- high school   Asthma    Complication of anesthesia    problem being combative awakening from  hysterectomy   79 but had total knee 08 and did fine   COPD (chronic obstructive pulmonary disease) (HCC)    Diabetes mellitus    Dyspnea    GERD (gastroesophageal reflux disease)    occ   Headache(784.0)    migraines hx - couple/month   Hyperlipidemia    Hypertension    Osteoarthritis    fingers, hands   Osteopenia    PONV (postoperative nausea and vomiting)     Assessment: Patient Reported Symptoms:  Cognitive Cognitive Status: No symptoms reported   Health Maintenance Behaviors: None  Neurological Neurological Review of Symptoms: Headaches Neurological Management Strategies: Routine screening Neurological Self-Management Outcome: 4 (good) Neurological Comment: migraines  HEENT HEENT Symptoms Reported: Change or loss of hearing HEENT Management Strategies: Routine screening HEENT Self-Management Outcome: 4 (good)    Cardiovascular Cardiovascular Symptoms Reported: Swelling in legs or feet Does patient have uncontrolled Hypertension?: Yes Is patient checking Blood Pressure at home?: No Cardiovascular Management Strategies: Routine screening Cardiovascular Self-Management Outcome: 4 (good)  Respiratory Respiratory Symptoms Reported: No symptoms reported Other Respiratory Symptoms: asthma Respiratory Management Strategies: Breathing exercise, Routine screening Respiratory Self-Management Outcome: 4 (good)  Endocrine Endocrine Symptoms Reported: No symptoms reported Is patient diabetic?: Yes Is patient checking blood  sugars at home?: Yes List most recent blood sugar readings, include date and time of day: 98 evening 135 fasting Endocrine Self-Management Outcome: 4 (good)  Gastrointestinal Gastrointestinal Symptoms Reported: No symptoms reported Gastrointestinal Management Strategies: Adequate rest Gastrointestinal Self-Management Outcome: 4 (good)    Genitourinary Genitourinary Symptoms Reported: No symptoms reported Genitourinary Self-Management Outcome: 4 (good)  Integumentary Integumentary Symptoms Reported: No symptoms reported Skin Management Strategies: Routine screening Skin Self-Management Outcome: 4 (good)  Musculoskeletal Musculoskelatal Symptoms Reviewed: Joint pain Additional Musculoskeletal Details: left knee Musculoskeletal Management Strategies: Routine screening Musculoskeletal Self-Management Outcome: 4 (good) Falls in the past year?: No Number of falls in past year: 1 or less Was there an injury with Fall?: No Fall Risk Category Calculator: 0 Patient Fall Risk Level: Low Fall Risk Fall risk Follow up: Falls evaluation completed, Education provided, Falls prevention discussed  Psychosocial Psychosocial Symptoms Reported: No symptoms reported Behavioral Health Self-Management Outcome: 4 (good) Major Change/Loss/Stressor/Fears (CP): Denies Techniques to Cope with Loss/Stress/Change: Not applicable Quality of Family Relationships: supportive Do you feel physically threatened by others?: No    12/20/2024    PHQ2-9 Depression Screening   Little interest or pleasure in doing things Not at all  Feeling down, depressed, or hopeless Not at all  PHQ-2 - Total Score 0  Trouble falling or staying asleep, or sleeping too much Not at all  Feeling tired or having little energy Not at all  Poor appetite or overeating  Not at all  Feeling bad about yourself - or that you are a failure or have let yourself or your family down Not at all  Trouble concentrating on things, such as reading the  newspaper or watching television Not at all  Moving or speaking so slowly that other people could  have noticed.  Or the opposite - being so fidgety or restless that you have been moving around a lot more than usual Not at all  Thoughts that you would be better off dead, or hurting yourself in some way Not at all  PHQ2-9 Total Score 0  If you checked off any problems, how difficult have these problems made it for you to do your work, take care of things at home, or get along with other people    Depression Interventions/Treatment      There were no vitals filed for this visit. Pain Scale: 0-10 Pain Score: 8  Pain Type: Chronic pain Pain Location: Heel Pain Orientation: Left, Right Pain Intervention(s): Pain med given for lower pain score than stated, per patient request  Medications Reviewed Today     Reviewed by Nivia, Yon Schiffman , RN (Registered Nurse) on 12/20/24 at [phone]  Med List Status: <None>   Medication Order Taking? Sig Documenting Provider Last Dose Status Informant  albuterol  (VENTOLIN  HFA) 108 (90 Base) MCG/ACT inhaler [id number] Yes Inhale 1-2 puffs into the lungs every 6 (six) hours as needed for wheezing or shortness of breath. Ziglar, Susan K, MD  Active   alendronate  (FOSAMAX ) 70 MG tablet [id number] Yes Take 1 tablet (70 mg total) by mouth every 7 (seven) days. Take with a full glass of water on an empty stomach. Ziglar, Susan K, MD  Active   amLODipine  (NORVASC ) 5 MG tablet [id number] Yes Take 1 tablet (5 mg total) by mouth daily. Ziglar, Susan K, MD  Active   aspirin  (ASPIRIN  LOW DOSE) 81 MG chewable tablet [id number]  CHEW 1 TABLET (81 MG TOTAL) BY MOUTH ONCE FOR 1 DOSE.  Patient not taking: Reported on 12/20/2024   Ziglar, Susan K, MD  Active   Blood Glucose Monitoring Suppl [facility] VERIO) w/Device PRESSLEY [id number] Yes Use as instructed to check blood sugars [place], MD  Active   Cholecalciferol 125 MCG (5000 UT) TABS [id number]  Take 2 tablets by mouth daily. Notes taking  this higher dose as directed by PCP for deficiency  Patient not taking: Reported on 12/20/2024   [provider]  Active   gabapentin  (NEURONTIN ) 100 MG capsule [id number] Yes TAKE 1 CAPSULE EVERY 8 HOURS FOR MIGRAINE HEADACHE Ziglar, Susan K, MD  Active   gabapentin  (NEURONTIN ) 600 MG tablet [id number] Yes Take 1 tablet (600 mg total) by mouth at bedtime. Ziglar, Susan K, MD  Active   glipiZIDE  (GLUCOTROL  XL) 5 MG 24 hr tablet [id number] Yes Take 1 tablet (5 mg total) by mouth 2 (two) times daily. Ziglar, Susan K, MD  Active   glucose blood [facility] VERIO) test strip [id number] Yes 1 each by Other route daily. Ziglar, Susan K, MD  Active   Lancets [facility] ULTRASOFT) lancets [id number] Yes Use as instructed 1 daily Ziglar, Susan K, MD  Active   losartan  (COZAAR ) 25 MG tablet [id number] Yes Take 1 tablet (25 mg total) by mouth daily. Ziglar, Susan K, MD  Active   meloxicam  (MOBIC ) 15 MG tablet [id number] Yes One tab PO every 24 hours with a meal prn pain. Ziglar, Susan K, MD  Active   metFORMIN  (GLUCOPHAGE -XR) 500 MG 24 hr tablet [id number] Yes Take 4 tablets (2,000 mg total) by mouth daily. Ziglar, Susan K, MD  Active   ondansetron  (ZOFRAN  ODT) 4 MG disintegrating tablet [id number] Yes Take 1 tablet (4 mg total) by mouth every 8 (eight) hours as needed for nausea or vomiting. Ziglar, Susan K, MD  Active  pioglitazone  (ACTOS ) 15 MG tablet [id number] Yes Take 1 tablet (15 mg total) by mouth daily. Ziglar, Susan K, MD  Active   RYBELSUS  14 MG TABS [id number]  Take 1 tablet (14 mg total) by mouth every morning.  Patient not taking: Reported on 12/20/2024   Ziglar, Susan K, MD  Active   simvastatin  (ZOCOR ) 40 MG tablet [id number] Yes Take 1 tablet (40 mg total) by mouth every evening. Ziglar, Susan K, MD  Active   traMADol  (ULTRAM ) 50 MG tablet [id number] Yes Take 1-2 tablets (50-100 mg total) by mouth every 12 (twelve) hours as needed for moderate pain (pain score 4-6). Ziglar, Susan K, MD  Active              Recommendation:   Continue Current Plan of Care  Follow Up Plan:   Telephone follow up appointment date/time:  01/03/25  Adiva Boettner RN RN Care Manager [facility] Population Health [phone]

## 2024-12-20 NOTE — Telephone Encounter (Signed)
 Are you able to close the pended orders for the [facility] referral for [facility] Care Management  It is showing there is a pended order or procedure?

## 2024-12-21 ENCOUNTER — Other Ambulatory Visit: Payer: Self-pay

## 2024-12-21 NOTE — Patient Instructions (Signed)
 Visit Information  Thank you for taking time to visit with me today. Please don't hesitate to contact me if I can be of assistance to you before our next scheduled appointment.  Our next appointment is by telephone on 01/03/25 at 10am Please call the care guide team at [phone] if you need to cancel or reschedule your appointment.   Following is a copy of your care plan:   Goals Addressed             This Visit's Progress    BSW Goals       Current SDOH Barriers:  Financial constraints related to assistance with utilities Food assistance  Interventions: SW provided patient with resources to assist with food and utilities. SW provided information to apply for foodstamps.   Thersia Hoar, BSW, [facility] [place]  [facility] Social Worker, Population Health [phone]           Please call the Suicide and Crisis Lifeline: 988 call the [place]  National Suicide Prevention Lifeline: (364)190-4026 or TTY: 847-020-0316 TTY 337-789-9220) to talk to a trained counselor call 1-800-273-TALK (toll free, 24 hour hotline) call 911 if you are experiencing a Mental Health or Behavioral Health Crisis or need someone to talk to.  Patient verbalized understanding of Care plan and visit instructions communicated this visit Thersia Hoar, BSW, [facility]  [facility] Social Worker, Population Health [phone]

## 2024-12-21 NOTE — Patient Outreach (Signed)
 Social Drivers of Health  Community Resource and Care Coordination Visit Note   12/21/2024  Name: Patricia Travis MRN: [id number] DOB:June 28, 1951  Situation: Referral received for [facility] needs assessment and assistance related to Financial Strain  Food Insecurity  utilities. I obtained verbal consent from Patient.  Visit completed with Patient on the phone.   Background:      Assessment:   Goals Addressed             This Visit's Progress    BSW Goals       Current SDOH Barriers:  Financial constraints related to assistance with utilities Food assistance  Interventions: SW provided patient with resources to assist with food and utilities. SW provided information to apply for foodstamps.   Thersia Hoar, HEDWIG, MHA [place]  [facility] Social Worker, Population Health [phone]           Recommendation:   Use resources sw mailed to assist with food and utilities.  Follow Up Plan:   Telephone follow-up 01/03/25 at 10am  Thersia Hoar, BSW, [facility] [place]  Value Based [facility] Social Worker, Population Health [phone]

## 2024-12-29 ENCOUNTER — Other Ambulatory Visit: Payer: Self-pay | Admitting: Family Medicine

## 2024-12-29 DIAGNOSIS — Z1231 Encounter for screening mammogram for malignant neoplasm of breast: Secondary | ICD-10-CM

## 2025-01-03 ENCOUNTER — Telehealth

## 2025-01-11 ENCOUNTER — Other Ambulatory Visit: Admitting: Pharmacist

## 2025-01-25 ENCOUNTER — Encounter

## 2025-01-31 ENCOUNTER — Telehealth

## 2025-02-17 ENCOUNTER — Ambulatory Visit

## 2025-03-09 ENCOUNTER — Ambulatory Visit: Admitting: Family Medicine

## 2025-05-03 ENCOUNTER — Ambulatory Visit: Admitting: Endocrinology
